# Patient Record
Sex: Female | Born: 1935 | Race: White | Hispanic: No | Smoking: Former smoker
Health system: Southern US, Community
[De-identification: ages and names within clinical notes are randomized; demographics above are authoritative.]

## PROBLEM LIST (undated history)

## (undated) DIAGNOSIS — G4700 Insomnia, unspecified: Secondary | ICD-10-CM

## (undated) DIAGNOSIS — K222 Esophageal obstruction: Secondary | ICD-10-CM

## (undated) DIAGNOSIS — I714 Abdominal aortic aneurysm, without rupture, unspecified: Secondary | ICD-10-CM

## (undated) DIAGNOSIS — K2970 Gastritis, unspecified, without bleeding: Secondary | ICD-10-CM

## (undated) DIAGNOSIS — D369 Benign neoplasm, unspecified site: Secondary | ICD-10-CM

## (undated) DIAGNOSIS — K648 Other hemorrhoids: Secondary | ICD-10-CM

## (undated) DIAGNOSIS — IMO0002 Reserved for concepts with insufficient information to code with codable children: Secondary | ICD-10-CM

## (undated) DIAGNOSIS — D126 Benign neoplasm of colon, unspecified: Secondary | ICD-10-CM

## (undated) DIAGNOSIS — C50919 Malignant neoplasm of unspecified site of unspecified female breast: Secondary | ICD-10-CM

## (undated) DIAGNOSIS — K228 Other specified diseases of esophagus: Secondary | ICD-10-CM

## (undated) DIAGNOSIS — E785 Hyperlipidemia, unspecified: Secondary | ICD-10-CM

## (undated) DIAGNOSIS — M1990 Unspecified osteoarthritis, unspecified site: Secondary | ICD-10-CM

## (undated) DIAGNOSIS — F32A Depression, unspecified: Secondary | ICD-10-CM

## (undated) DIAGNOSIS — F419 Anxiety disorder, unspecified: Secondary | ICD-10-CM

## (undated) DIAGNOSIS — S72009A Fracture of unspecified part of neck of unspecified femur, initial encounter for closed fracture: Secondary | ICD-10-CM

## (undated) DIAGNOSIS — F329 Major depressive disorder, single episode, unspecified: Secondary | ICD-10-CM

## (undated) DIAGNOSIS — J449 Chronic obstructive pulmonary disease, unspecified: Secondary | ICD-10-CM

## (undated) DIAGNOSIS — K5790 Diverticulosis of intestine, part unspecified, without perforation or abscess without bleeding: Secondary | ICD-10-CM

## (undated) DIAGNOSIS — C210 Malignant neoplasm of anus, unspecified: Secondary | ICD-10-CM

## (undated) DIAGNOSIS — M549 Dorsalgia, unspecified: Secondary | ICD-10-CM

## (undated) DIAGNOSIS — I519 Heart disease, unspecified: Secondary | ICD-10-CM

## (undated) DIAGNOSIS — I10 Essential (primary) hypertension: Secondary | ICD-10-CM

## (undated) DIAGNOSIS — I509 Heart failure, unspecified: Secondary | ICD-10-CM

## (undated) DIAGNOSIS — K2289 Other specified disease of esophagus: Secondary | ICD-10-CM

## (undated) DIAGNOSIS — R7302 Impaired glucose tolerance (oral): Secondary | ICD-10-CM

## (undated) HISTORY — PX: OOPHORECTOMY: SHX86

## (undated) HISTORY — DX: Esophageal obstruction: K22.2

## (undated) HISTORY — DX: Benign neoplasm of colon, unspecified: D12.6

## (undated) HISTORY — DX: Gastritis, unspecified, without bleeding: K29.70

## (undated) HISTORY — DX: Abdominal aortic aneurysm, without rupture, unspecified: I71.40

## (undated) HISTORY — DX: Heart disease, unspecified: I51.9

## (undated) HISTORY — DX: Depression, unspecified: F32.A

## (undated) HISTORY — DX: Benign neoplasm, unspecified site: D36.9

## (undated) HISTORY — DX: Malignant neoplasm of anus, unspecified: C21.0

## (undated) HISTORY — DX: Reserved for concepts with insufficient information to code with codable children: IMO0002

## (undated) HISTORY — DX: Unspecified osteoarthritis, unspecified site: M19.90

## (undated) HISTORY — PX: ROTATOR CUFF REPAIR: SHX139

## (undated) HISTORY — DX: Other specified disease of esophagus: K22.89

## (undated) HISTORY — PX: KNEE ARTHROSCOPY: SUR90

## (undated) HISTORY — DX: Heart failure, unspecified: I50.9

## (undated) HISTORY — DX: Diverticulosis of intestine, part unspecified, without perforation or abscess without bleeding: K57.90

## (undated) HISTORY — DX: Fracture of unspecified part of neck of unspecified femur, initial encounter for closed fracture: S72.009A

## (undated) HISTORY — DX: Impaired glucose tolerance (oral): R73.02

## (undated) HISTORY — DX: Other specified diseases of esophagus: K22.8

## (undated) HISTORY — DX: Dorsalgia, unspecified: M54.9

## (undated) HISTORY — DX: Chronic obstructive pulmonary disease, unspecified: J44.9

## (undated) HISTORY — DX: Insomnia, unspecified: G47.00

## (undated) HISTORY — DX: Malignant neoplasm of unspecified site of unspecified female breast: C50.919

## (undated) HISTORY — DX: Anxiety disorder, unspecified: F41.9

## (undated) HISTORY — DX: Essential (primary) hypertension: I10

## (undated) HISTORY — DX: Other hemorrhoids: K64.8

## (undated) HISTORY — DX: Hyperlipidemia, unspecified: E78.5

## (undated) HISTORY — DX: Major depressive disorder, single episode, unspecified: F32.9

## (undated) HISTORY — DX: Abdominal aortic aneurysm, without rupture: I71.4

## (undated) HISTORY — PX: KYPHOSIS SURGERY: SHX114

## (undated) HISTORY — PX: OTHER SURGICAL HISTORY: SHX169

---

## 1963-02-13 HISTORY — PX: HEMORRHOID SURGERY: SHX153

## 1966-02-12 HISTORY — PX: ABDOMINAL HYSTERECTOMY: SHX81

## 1966-02-12 HISTORY — PX: TONSILLECTOMY: SUR1361

## 1999-10-26 ENCOUNTER — Other Ambulatory Visit: Admission: RE | Admit: 1999-10-26 | Discharge: 1999-10-26 | Payer: Self-pay | Admitting: Internal Medicine

## 1999-10-26 ENCOUNTER — Encounter (INDEPENDENT_AMBULATORY_CARE_PROVIDER_SITE_OTHER): Payer: Self-pay | Admitting: Specialist

## 2000-03-28 ENCOUNTER — Encounter: Admission: RE | Admit: 2000-03-28 | Discharge: 2000-03-28 | Payer: Self-pay

## 2002-12-21 ENCOUNTER — Ambulatory Visit (HOSPITAL_COMMUNITY): Admission: RE | Admit: 2002-12-21 | Discharge: 2002-12-21 | Payer: Self-pay | Admitting: Internal Medicine

## 2003-09-09 ENCOUNTER — Ambulatory Visit (HOSPITAL_COMMUNITY): Admission: RE | Admit: 2003-09-09 | Discharge: 2003-09-09 | Payer: Self-pay | Admitting: Ophthalmology

## 2004-01-04 ENCOUNTER — Encounter: Admission: RE | Admit: 2004-01-04 | Discharge: 2004-02-23 | Payer: Self-pay | Admitting: Orthopedic Surgery

## 2004-01-26 ENCOUNTER — Ambulatory Visit: Payer: Self-pay | Admitting: Internal Medicine

## 2004-02-23 ENCOUNTER — Ambulatory Visit: Payer: Self-pay | Admitting: Internal Medicine

## 2004-04-21 ENCOUNTER — Ambulatory Visit: Payer: Self-pay | Admitting: Internal Medicine

## 2004-05-02 ENCOUNTER — Ambulatory Visit: Payer: Self-pay | Admitting: Internal Medicine

## 2004-05-30 ENCOUNTER — Ambulatory Visit: Payer: Self-pay | Admitting: Internal Medicine

## 2004-06-02 ENCOUNTER — Encounter: Admission: RE | Admit: 2004-06-02 | Discharge: 2004-06-02 | Payer: Self-pay | Admitting: Internal Medicine

## 2004-06-02 ENCOUNTER — Ambulatory Visit: Payer: Self-pay | Admitting: Internal Medicine

## 2004-06-08 ENCOUNTER — Encounter: Payer: Self-pay | Admitting: Internal Medicine

## 2004-06-09 ENCOUNTER — Ambulatory Visit: Payer: Self-pay | Admitting: Internal Medicine

## 2004-06-09 ENCOUNTER — Ambulatory Visit: Payer: Self-pay

## 2004-06-12 ENCOUNTER — Encounter (INDEPENDENT_AMBULATORY_CARE_PROVIDER_SITE_OTHER): Payer: Self-pay | Admitting: *Deleted

## 2004-06-12 ENCOUNTER — Ambulatory Visit (HOSPITAL_COMMUNITY): Admission: RE | Admit: 2004-06-12 | Discharge: 2004-06-12 | Payer: Self-pay | Admitting: Internal Medicine

## 2004-06-16 ENCOUNTER — Encounter (INDEPENDENT_AMBULATORY_CARE_PROVIDER_SITE_OTHER): Payer: Self-pay | Admitting: *Deleted

## 2004-06-16 ENCOUNTER — Ambulatory Visit (HOSPITAL_COMMUNITY): Admission: RE | Admit: 2004-06-16 | Discharge: 2004-06-16 | Payer: Self-pay | Admitting: Interventional Radiology

## 2004-06-19 ENCOUNTER — Ambulatory Visit: Payer: Self-pay | Admitting: Internal Medicine

## 2004-06-20 ENCOUNTER — Encounter: Admission: RE | Admit: 2004-06-20 | Discharge: 2004-06-20 | Payer: Self-pay | Admitting: Internal Medicine

## 2004-06-26 ENCOUNTER — Ambulatory Visit: Payer: Self-pay | Admitting: Internal Medicine

## 2004-06-26 ENCOUNTER — Encounter: Payer: Self-pay | Admitting: Interventional Radiology

## 2004-07-01 ENCOUNTER — Inpatient Hospital Stay (HOSPITAL_COMMUNITY): Admission: EM | Admit: 2004-07-01 | Discharge: 2004-07-04 | Payer: Self-pay | Admitting: Emergency Medicine

## 2004-07-02 ENCOUNTER — Ambulatory Visit: Payer: Self-pay | Admitting: Internal Medicine

## 2004-07-03 ENCOUNTER — Encounter: Payer: Self-pay | Admitting: Cardiology

## 2004-07-03 ENCOUNTER — Ambulatory Visit: Payer: Self-pay | Admitting: Cardiology

## 2004-07-11 ENCOUNTER — Ambulatory Visit: Payer: Self-pay | Admitting: Internal Medicine

## 2004-07-21 ENCOUNTER — Ambulatory Visit: Payer: Self-pay | Admitting: Internal Medicine

## 2004-07-25 ENCOUNTER — Ambulatory Visit: Payer: Self-pay | Admitting: Internal Medicine

## 2004-07-27 ENCOUNTER — Ambulatory Visit: Payer: Self-pay

## 2004-08-01 ENCOUNTER — Ambulatory Visit: Payer: Self-pay

## 2004-08-02 ENCOUNTER — Emergency Department (HOSPITAL_COMMUNITY): Admission: EM | Admit: 2004-08-02 | Discharge: 2004-08-02 | Payer: Self-pay | Admitting: Emergency Medicine

## 2004-08-03 ENCOUNTER — Ambulatory Visit: Payer: Self-pay | Admitting: Internal Medicine

## 2004-08-17 ENCOUNTER — Ambulatory Visit: Payer: Self-pay | Admitting: Internal Medicine

## 2004-09-25 ENCOUNTER — Encounter: Admission: RE | Admit: 2004-09-25 | Discharge: 2004-09-25 | Payer: Self-pay | Admitting: Internal Medicine

## 2004-09-25 ENCOUNTER — Ambulatory Visit: Payer: Self-pay | Admitting: Internal Medicine

## 2004-11-17 ENCOUNTER — Ambulatory Visit: Payer: Self-pay | Admitting: Internal Medicine

## 2005-02-06 ENCOUNTER — Ambulatory Visit: Payer: Self-pay | Admitting: Internal Medicine

## 2005-02-12 DIAGNOSIS — C50919 Malignant neoplasm of unspecified site of unspecified female breast: Secondary | ICD-10-CM

## 2005-02-12 HISTORY — DX: Malignant neoplasm of unspecified site of unspecified female breast: C50.919

## 2005-02-12 HISTORY — PX: MASTECTOMY, PARTIAL: SHX709

## 2005-04-20 ENCOUNTER — Ambulatory Visit: Payer: Self-pay | Admitting: Internal Medicine

## 2005-04-25 ENCOUNTER — Ambulatory Visit: Payer: Self-pay | Admitting: Internal Medicine

## 2005-04-26 ENCOUNTER — Encounter: Admission: RE | Admit: 2005-04-26 | Discharge: 2005-04-26 | Payer: Self-pay | Admitting: Internal Medicine

## 2005-05-11 ENCOUNTER — Ambulatory Visit (HOSPITAL_BASED_OUTPATIENT_CLINIC_OR_DEPARTMENT_OTHER): Admission: RE | Admit: 2005-05-11 | Discharge: 2005-05-11 | Payer: Self-pay | Admitting: Orthopedic Surgery

## 2005-07-20 ENCOUNTER — Ambulatory Visit: Payer: Self-pay | Admitting: Internal Medicine

## 2005-09-13 ENCOUNTER — Encounter: Admission: RE | Admit: 2005-09-13 | Discharge: 2005-09-13 | Payer: Self-pay | Admitting: Orthopedic Surgery

## 2005-10-11 ENCOUNTER — Ambulatory Visit: Payer: Self-pay | Admitting: Internal Medicine

## 2005-10-25 ENCOUNTER — Encounter (INDEPENDENT_AMBULATORY_CARE_PROVIDER_SITE_OTHER): Payer: Self-pay | Admitting: Diagnostic Radiology

## 2005-10-25 ENCOUNTER — Encounter (INDEPENDENT_AMBULATORY_CARE_PROVIDER_SITE_OTHER): Payer: Self-pay | Admitting: Specialist

## 2005-10-25 ENCOUNTER — Encounter: Admission: RE | Admit: 2005-10-25 | Discharge: 2005-10-25 | Payer: Self-pay | Admitting: Internal Medicine

## 2005-11-02 ENCOUNTER — Encounter: Admission: RE | Admit: 2005-11-02 | Discharge: 2005-11-02 | Payer: Self-pay | Admitting: Internal Medicine

## 2005-11-07 ENCOUNTER — Encounter: Admission: RE | Admit: 2005-11-07 | Discharge: 2005-11-07 | Payer: Self-pay | Admitting: General Surgery

## 2005-11-08 ENCOUNTER — Encounter (INDEPENDENT_AMBULATORY_CARE_PROVIDER_SITE_OTHER): Payer: Self-pay | Admitting: *Deleted

## 2005-11-08 ENCOUNTER — Ambulatory Visit (HOSPITAL_BASED_OUTPATIENT_CLINIC_OR_DEPARTMENT_OTHER): Admission: RE | Admit: 2005-11-08 | Discharge: 2005-11-08 | Payer: Self-pay | Admitting: General Surgery

## 2005-11-20 ENCOUNTER — Ambulatory Visit: Payer: Self-pay | Admitting: Oncology

## 2005-11-23 ENCOUNTER — Ambulatory Visit: Payer: Self-pay | Admitting: Internal Medicine

## 2005-12-11 ENCOUNTER — Ambulatory Visit: Payer: Self-pay | Admitting: Internal Medicine

## 2005-12-18 LAB — CBC & DIFF AND RETIC
BASO%: 0.7 % (ref 0.0–2.0)
EOS%: 2.2 % (ref 0.0–7.0)
HCT: 37.1 % (ref 34.8–46.6)
HGB: 12.4 g/dL (ref 11.6–15.9)
IRF: 0.27 (ref 0.130–0.330)
MCH: 33.1 pg (ref 26.0–34.0)
MCHC: 33.5 g/dL (ref 32.0–36.0)
MONO#: 0.7 10*3/uL (ref 0.1–0.9)
RDW: 13.7 % (ref 11.3–14.5)
RETIC #: 28.9 10*3/uL (ref 19.7–115.1)
WBC: 5.8 10*3/uL (ref 3.9–10.0)
lymph#: 1.7 10*3/uL (ref 0.9–3.3)

## 2005-12-18 LAB — MORPHOLOGY: PLT EST: ADEQUATE

## 2005-12-18 LAB — CHCC SMEAR

## 2005-12-19 LAB — PROTEIN ELECTROPHORESIS, SERUM
Beta 2: 3.9 % (ref 3.2–6.5)
Beta Globulin: 5.7 % (ref 4.7–7.2)
Gamma Globulin: 13.4 % (ref 11.1–18.8)

## 2005-12-19 LAB — IRON AND TIBC
Iron: 123 ug/dL (ref 42–145)
UIBC: 194 ug/dL

## 2005-12-19 LAB — FERRITIN: Ferritin: 41 ng/mL (ref 10–291)

## 2005-12-25 ENCOUNTER — Encounter: Payer: Self-pay | Admitting: Internal Medicine

## 2005-12-25 ENCOUNTER — Ambulatory Visit: Payer: Self-pay | Admitting: Internal Medicine

## 2005-12-25 ENCOUNTER — Ambulatory Visit: Admission: RE | Admit: 2005-12-25 | Discharge: 2006-03-01 | Payer: Self-pay | Admitting: Radiation Oncology

## 2006-01-09 ENCOUNTER — Ambulatory Visit: Payer: Self-pay | Admitting: Oncology

## 2006-01-14 ENCOUNTER — Ambulatory Visit: Payer: Self-pay | Admitting: Cardiology

## 2006-03-12 ENCOUNTER — Ambulatory Visit: Payer: Self-pay | Admitting: Oncology

## 2006-03-15 LAB — CBC WITH DIFFERENTIAL/PLATELET
BASO%: 1.1 % (ref 0.0–2.0)
EOS%: 1.8 % (ref 0.0–7.0)
HCT: 37.5 % (ref 34.8–46.6)
LYMPH%: 25 % (ref 14.0–48.0)
MCH: 33.3 pg (ref 26.0–34.0)
MCHC: 34.1 g/dL (ref 32.0–36.0)
MCV: 97.7 fL (ref 81.0–101.0)
MONO#: 0.5 10*3/uL (ref 0.1–0.9)
MONO%: 9.9 % (ref 0.0–13.0)
NEUT%: 62.2 % (ref 39.6–76.8)
Platelets: 294 10*3/uL (ref 145–400)
RBC: 3.84 10*6/uL (ref 3.70–5.32)
WBC: 5.4 10*3/uL (ref 3.9–10.0)

## 2006-03-15 LAB — COMPREHENSIVE METABOLIC PANEL
AST: 13 U/L (ref 0–37)
Albumin: 4.5 g/dL (ref 3.5–5.2)
Alkaline Phosphatase: 68 U/L (ref 39–117)
Calcium: 9.6 mg/dL (ref 8.4–10.5)
Creatinine, Ser: 0.78 mg/dL (ref 0.40–1.20)
Sodium: 143 mEq/L (ref 135–145)
Total Bilirubin: 0.5 mg/dL (ref 0.3–1.2)

## 2006-04-06 DIAGNOSIS — Z8719 Personal history of other diseases of the digestive system: Secondary | ICD-10-CM

## 2006-04-06 DIAGNOSIS — I509 Heart failure, unspecified: Secondary | ICD-10-CM | POA: Insufficient documentation

## 2006-04-06 DIAGNOSIS — M545 Low back pain: Secondary | ICD-10-CM

## 2006-04-06 DIAGNOSIS — G4700 Insomnia, unspecified: Secondary | ICD-10-CM

## 2006-04-06 DIAGNOSIS — K222 Esophageal obstruction: Secondary | ICD-10-CM | POA: Insufficient documentation

## 2006-04-06 DIAGNOSIS — M810 Age-related osteoporosis without current pathological fracture: Secondary | ICD-10-CM | POA: Insufficient documentation

## 2006-04-06 DIAGNOSIS — J449 Chronic obstructive pulmonary disease, unspecified: Secondary | ICD-10-CM

## 2006-04-06 DIAGNOSIS — E785 Hyperlipidemia, unspecified: Secondary | ICD-10-CM

## 2006-04-06 DIAGNOSIS — J4489 Other specified chronic obstructive pulmonary disease: Secondary | ICD-10-CM | POA: Insufficient documentation

## 2006-04-06 DIAGNOSIS — F329 Major depressive disorder, single episode, unspecified: Secondary | ICD-10-CM

## 2006-06-03 ENCOUNTER — Ambulatory Visit: Payer: Self-pay | Admitting: Oncology

## 2006-06-03 ENCOUNTER — Ambulatory Visit: Payer: Self-pay | Admitting: Internal Medicine

## 2006-06-05 ENCOUNTER — Encounter: Admission: RE | Admit: 2006-06-05 | Discharge: 2006-06-05 | Payer: Self-pay | Admitting: Internal Medicine

## 2006-06-24 LAB — COMPREHENSIVE METABOLIC PANEL
ALT: 14 U/L (ref 0–35)
AST: 18 U/L (ref 0–37)
Albumin: 4 g/dL (ref 3.5–5.2)
Alkaline Phosphatase: 61 U/L (ref 39–117)
BUN: 12 mg/dL (ref 6–23)
Calcium: 9.8 mg/dL (ref 8.4–10.5)
Chloride: 103 mEq/L (ref 96–112)
Potassium: 4 mEq/L (ref 3.5–5.3)
Sodium: 139 mEq/L (ref 135–145)
Total Protein: 7.1 g/dL (ref 6.0–8.3)

## 2006-06-24 LAB — CBC WITH DIFFERENTIAL/PLATELET
Eosinophils Absolute: 0.1 10*3/uL (ref 0.0–0.5)
MCV: 96.4 fL (ref 81.0–101.0)
MONO#: 0.6 10*3/uL (ref 0.1–0.9)
MONO%: 8.8 % (ref 0.0–13.0)
NEUT#: 3.8 10*3/uL (ref 1.5–6.5)
RBC: 3.79 10*6/uL (ref 3.70–5.32)
RDW: 11.1 % — ABNORMAL LOW (ref 11.3–14.5)
WBC: 6.4 10*3/uL (ref 3.9–10.0)

## 2006-07-04 ENCOUNTER — Ambulatory Visit: Payer: Self-pay | Admitting: Internal Medicine

## 2006-07-04 LAB — CONVERTED CEMR LAB
BUN: 16 mg/dL (ref 6–23)
Creatinine, Ser: 0.9 mg/dL (ref 0.4–1.2)

## 2006-07-09 ENCOUNTER — Ambulatory Visit: Payer: Self-pay | Admitting: Cardiology

## 2006-07-10 LAB — CANCER ANTIGEN 27.29: CA 27.29: 25 U/mL (ref 0–39)

## 2006-07-10 LAB — VITAMIN D PNL(25-HYDRXY+1,25-DIHY)-BLD
Vit D, 1,25-Dihydroxy: 19
Vit D, 25-Hydroxy: 10 ng/mL — ABNORMAL LOW (ref 20–57)

## 2006-07-12 ENCOUNTER — Telehealth: Payer: Self-pay | Admitting: Internal Medicine

## 2006-07-15 ENCOUNTER — Telehealth: Payer: Self-pay | Admitting: Internal Medicine

## 2006-07-15 DIAGNOSIS — I714 Abdominal aortic aneurysm, without rupture, unspecified: Secondary | ICD-10-CM | POA: Insufficient documentation

## 2006-07-17 ENCOUNTER — Encounter: Payer: Self-pay | Admitting: Internal Medicine

## 2006-08-05 ENCOUNTER — Ambulatory Visit: Payer: Self-pay | Admitting: Cardiology

## 2006-08-05 ENCOUNTER — Telehealth: Payer: Self-pay | Admitting: Internal Medicine

## 2006-08-12 ENCOUNTER — Ambulatory Visit: Payer: Self-pay | Admitting: Vascular Surgery

## 2006-08-12 ENCOUNTER — Encounter: Payer: Self-pay | Admitting: Internal Medicine

## 2006-08-30 ENCOUNTER — Encounter
Admission: RE | Admit: 2006-08-30 | Discharge: 2006-11-28 | Payer: Self-pay | Admitting: Physical Medicine & Rehabilitation

## 2006-08-30 ENCOUNTER — Ambulatory Visit: Payer: Self-pay | Admitting: Oncology

## 2006-09-02 ENCOUNTER — Ambulatory Visit: Payer: Self-pay | Admitting: Physical Medicine & Rehabilitation

## 2006-09-09 ENCOUNTER — Ambulatory Visit (HOSPITAL_COMMUNITY)
Admission: RE | Admit: 2006-09-09 | Discharge: 2006-09-09 | Payer: Self-pay | Admitting: Physical Medicine & Rehabilitation

## 2006-09-19 ENCOUNTER — Telehealth (INDEPENDENT_AMBULATORY_CARE_PROVIDER_SITE_OTHER): Payer: Self-pay | Admitting: *Deleted

## 2006-09-23 ENCOUNTER — Ambulatory Visit: Payer: Self-pay | Admitting: Family Medicine

## 2006-10-28 ENCOUNTER — Telehealth (INDEPENDENT_AMBULATORY_CARE_PROVIDER_SITE_OTHER): Payer: Self-pay | Admitting: *Deleted

## 2006-10-28 ENCOUNTER — Ambulatory Visit: Payer: Self-pay | Admitting: Physical Medicine & Rehabilitation

## 2006-10-29 ENCOUNTER — Ambulatory Visit: Payer: Self-pay | Admitting: Internal Medicine

## 2006-11-09 ENCOUNTER — Encounter: Payer: Self-pay | Admitting: Internal Medicine

## 2006-12-06 ENCOUNTER — Ambulatory Visit: Payer: Self-pay | Admitting: Internal Medicine

## 2006-12-16 ENCOUNTER — Ambulatory Visit: Payer: Self-pay | Admitting: Internal Medicine

## 2006-12-16 ENCOUNTER — Telehealth (INDEPENDENT_AMBULATORY_CARE_PROVIDER_SITE_OTHER): Payer: Self-pay | Admitting: *Deleted

## 2006-12-20 ENCOUNTER — Ambulatory Visit: Payer: Self-pay | Admitting: Internal Medicine

## 2006-12-23 ENCOUNTER — Ambulatory Visit: Payer: Self-pay | Admitting: Oncology

## 2007-01-06 LAB — COMPREHENSIVE METABOLIC PANEL
ALT: 9 U/L (ref 0–35)
AST: 13 U/L (ref 0–37)
Alkaline Phosphatase: 58 U/L (ref 39–117)
BUN: 9 mg/dL (ref 6–23)
Creatinine, Ser: 0.7 mg/dL (ref 0.40–1.20)
Potassium: 4.4 mEq/L (ref 3.5–5.3)

## 2007-01-06 LAB — CBC WITH DIFFERENTIAL/PLATELET
BASO%: 0.5 % (ref 0.0–2.0)
Basophils Absolute: 0 10*3/uL (ref 0.0–0.1)
EOS%: 1.3 % (ref 0.0–7.0)
HGB: 11.6 g/dL (ref 11.6–15.9)
MCH: 32.9 pg (ref 26.0–34.0)
MCHC: 34 g/dL (ref 32.0–36.0)
MCV: 96.9 fL (ref 81.0–101.0)
MONO%: 5.2 % (ref 0.0–13.0)
NEUT%: 76.2 % (ref 39.6–76.8)
RDW: 14 % (ref 11.3–14.5)
lymph#: 1.1 10*3/uL (ref 0.9–3.3)

## 2007-01-09 LAB — VITAMIN D PNL(25-HYDRXY+1,25-DIHY)-BLD: Vit D, 25-Hydroxy: 26 ng/mL — ABNORMAL LOW (ref 30–89)

## 2007-01-13 ENCOUNTER — Encounter: Payer: Self-pay | Admitting: Internal Medicine

## 2007-01-15 ENCOUNTER — Encounter: Payer: Self-pay | Admitting: Internal Medicine

## 2007-02-04 ENCOUNTER — Encounter: Payer: Self-pay | Admitting: Internal Medicine

## 2007-03-17 ENCOUNTER — Ambulatory Visit: Payer: Self-pay | Admitting: Internal Medicine

## 2007-03-21 ENCOUNTER — Telehealth (INDEPENDENT_AMBULATORY_CARE_PROVIDER_SITE_OTHER): Payer: Self-pay | Admitting: *Deleted

## 2007-03-21 LAB — CONVERTED CEMR LAB: Triglycerides: 77 mg/dL (ref 0–149)

## 2007-03-24 ENCOUNTER — Telehealth (INDEPENDENT_AMBULATORY_CARE_PROVIDER_SITE_OTHER): Payer: Self-pay | Admitting: *Deleted

## 2007-03-26 ENCOUNTER — Encounter: Payer: Self-pay | Admitting: Internal Medicine

## 2007-04-16 ENCOUNTER — Ambulatory Visit: Payer: Self-pay | Admitting: Internal Medicine

## 2007-04-21 ENCOUNTER — Ambulatory Visit: Payer: Self-pay | Admitting: Internal Medicine

## 2007-04-21 ENCOUNTER — Ambulatory Visit: Payer: Self-pay | Admitting: Critical Care Medicine

## 2007-04-21 ENCOUNTER — Ambulatory Visit: Payer: Self-pay | Admitting: Cardiovascular Disease

## 2007-04-21 ENCOUNTER — Inpatient Hospital Stay (HOSPITAL_COMMUNITY): Admission: EM | Admit: 2007-04-21 | Discharge: 2007-04-29 | Payer: Self-pay | Admitting: Emergency Medicine

## 2007-04-22 ENCOUNTER — Encounter (INDEPENDENT_AMBULATORY_CARE_PROVIDER_SITE_OTHER): Payer: Self-pay | Admitting: Internal Medicine

## 2007-04-22 LAB — CONVERTED CEMR LAB
Basophils Relative: 0.4 % (ref 0.0–1.0)
CO2: 30 meq/L (ref 19–32)
Calcium: 10.7 mg/dL — ABNORMAL HIGH (ref 8.4–10.5)
Eosinophils Absolute: 0.1 10*3/uL (ref 0.0–0.6)
Eosinophils Relative: 1.5 % (ref 0.0–5.0)
GFR calc Af Amer: 78 mL/min
GFR calc non Af Amer: 65 mL/min
Glucose, Bld: 96 mg/dL (ref 70–99)
Hemoglobin: 11.9 g/dL — ABNORMAL LOW (ref 12.0–15.0)
Lymphocytes Relative: 26.3 % (ref 12.0–46.0)
MCV: 98.6 fL (ref 78.0–100.0)
Monocytes Absolute: 0.6 10*3/uL (ref 0.2–0.7)
Neutro Abs: 4.5 10*3/uL (ref 1.4–7.7)
Neutrophils Relative %: 63.4 % (ref 43.0–77.0)
Platelets: 265 10*3/uL (ref 150–400)
Potassium: 3.8 meq/L (ref 3.5–5.1)
Sodium: 139 meq/L (ref 135–145)
WBC: 7 10*3/uL (ref 4.5–10.5)

## 2007-05-02 ENCOUNTER — Telehealth (INDEPENDENT_AMBULATORY_CARE_PROVIDER_SITE_OTHER): Payer: Self-pay | Admitting: *Deleted

## 2007-05-02 ENCOUNTER — Ambulatory Visit: Payer: Self-pay | Admitting: Internal Medicine

## 2007-05-02 DIAGNOSIS — D509 Iron deficiency anemia, unspecified: Secondary | ICD-10-CM | POA: Insufficient documentation

## 2007-05-05 ENCOUNTER — Telehealth (INDEPENDENT_AMBULATORY_CARE_PROVIDER_SITE_OTHER): Payer: Self-pay | Admitting: *Deleted

## 2007-06-03 ENCOUNTER — Ambulatory Visit: Payer: Self-pay | Admitting: Internal Medicine

## 2007-06-17 ENCOUNTER — Encounter: Payer: Self-pay | Admitting: Internal Medicine

## 2007-06-19 ENCOUNTER — Ambulatory Visit: Payer: Self-pay | Admitting: Internal Medicine

## 2007-06-26 ENCOUNTER — Ambulatory Visit: Payer: Self-pay | Admitting: Internal Medicine

## 2007-06-26 ENCOUNTER — Encounter: Payer: Self-pay | Admitting: Internal Medicine

## 2007-07-02 ENCOUNTER — Ambulatory Visit: Payer: Self-pay | Admitting: Oncology

## 2007-07-14 ENCOUNTER — Encounter: Payer: Self-pay | Admitting: Internal Medicine

## 2007-07-17 ENCOUNTER — Telehealth: Payer: Self-pay | Admitting: Internal Medicine

## 2007-07-17 ENCOUNTER — Telehealth (INDEPENDENT_AMBULATORY_CARE_PROVIDER_SITE_OTHER): Payer: Self-pay | Admitting: *Deleted

## 2007-07-31 ENCOUNTER — Ambulatory Visit: Payer: Self-pay | Admitting: Internal Medicine

## 2007-07-31 ENCOUNTER — Telehealth (INDEPENDENT_AMBULATORY_CARE_PROVIDER_SITE_OTHER): Payer: Self-pay | Admitting: *Deleted

## 2007-08-04 ENCOUNTER — Telehealth (INDEPENDENT_AMBULATORY_CARE_PROVIDER_SITE_OTHER): Payer: Self-pay | Admitting: *Deleted

## 2007-08-11 ENCOUNTER — Ambulatory Visit: Payer: Self-pay | Admitting: Internal Medicine

## 2007-08-18 ENCOUNTER — Ambulatory Visit: Payer: Self-pay | Admitting: Internal Medicine

## 2007-08-18 ENCOUNTER — Telehealth (INDEPENDENT_AMBULATORY_CARE_PROVIDER_SITE_OTHER): Payer: Self-pay | Admitting: *Deleted

## 2007-08-28 ENCOUNTER — Ambulatory Visit: Payer: Self-pay | Admitting: Cardiology

## 2007-11-10 ENCOUNTER — Telehealth (INDEPENDENT_AMBULATORY_CARE_PROVIDER_SITE_OTHER): Payer: Self-pay | Admitting: *Deleted

## 2007-11-11 ENCOUNTER — Ambulatory Visit: Payer: Self-pay | Admitting: Internal Medicine

## 2007-12-08 ENCOUNTER — Ambulatory Visit: Payer: Self-pay | Admitting: Internal Medicine

## 2007-12-09 ENCOUNTER — Ambulatory Visit: Payer: Self-pay | Admitting: Internal Medicine

## 2007-12-11 ENCOUNTER — Telehealth (INDEPENDENT_AMBULATORY_CARE_PROVIDER_SITE_OTHER): Payer: Self-pay | Admitting: *Deleted

## 2007-12-11 LAB — CONVERTED CEMR LAB
ALT: 13 units/L (ref 0–35)
AST: 15 units/L (ref 0–37)
Direct LDL: 127.1 mg/dL
TSH: 1.13 microintl units/mL (ref 0.35–5.50)
Total CHOL/HDL Ratio: 3.2
VLDL: 17 mg/dL (ref 0–40)

## 2007-12-26 ENCOUNTER — Ambulatory Visit: Payer: Self-pay | Admitting: Oncology

## 2007-12-30 ENCOUNTER — Encounter: Payer: Self-pay | Admitting: Internal Medicine

## 2007-12-30 LAB — CANCER ANTIGEN 27.29: CA 27.29: 23 U/mL (ref 0–39)

## 2007-12-30 LAB — CBC WITH DIFFERENTIAL/PLATELET
BASO%: 0.3 % (ref 0.0–2.0)
Eosinophils Absolute: 0.1 10*3/uL (ref 0.0–0.5)
HCT: 35.9 % (ref 34.8–46.6)
HGB: 12.1 g/dL (ref 11.6–15.9)
LYMPH%: 26.9 % (ref 14.0–48.0)
MCHC: 33.9 g/dL (ref 32.0–36.0)
MONO#: 0.4 10*3/uL (ref 0.1–0.9)
NEUT#: 3.5 10*3/uL (ref 1.5–6.5)
NEUT%: 63.9 % (ref 39.6–76.8)
Platelets: 230 10*3/uL (ref 145–400)
WBC: 5.5 10*3/uL (ref 3.9–10.0)
lymph#: 1.5 10*3/uL (ref 0.9–3.3)

## 2007-12-30 LAB — COMPREHENSIVE METABOLIC PANEL
ALT: 13 U/L (ref 0–35)
CO2: 31 mEq/L (ref 19–32)
Calcium: 9.5 mg/dL (ref 8.4–10.5)
Chloride: 102 mEq/L (ref 96–112)
Creatinine, Ser: 0.76 mg/dL (ref 0.40–1.20)
Glucose, Bld: 103 mg/dL — ABNORMAL HIGH (ref 70–99)
Total Bilirubin: 0.8 mg/dL (ref 0.3–1.2)
Total Protein: 6.3 g/dL (ref 6.0–8.3)

## 2008-01-09 ENCOUNTER — Telehealth (INDEPENDENT_AMBULATORY_CARE_PROVIDER_SITE_OTHER): Payer: Self-pay | Admitting: *Deleted

## 2008-01-27 ENCOUNTER — Ambulatory Visit: Payer: Self-pay | Admitting: Internal Medicine

## 2008-01-27 DIAGNOSIS — R5383 Other fatigue: Secondary | ICD-10-CM

## 2008-01-27 DIAGNOSIS — R5381 Other malaise: Secondary | ICD-10-CM

## 2008-01-27 LAB — CONVERTED CEMR LAB: Vit D, 1,25-Dihydroxy: 50 (ref 30–89)

## 2008-01-29 ENCOUNTER — Encounter: Payer: Self-pay | Admitting: Internal Medicine

## 2008-01-30 ENCOUNTER — Telehealth: Payer: Self-pay | Admitting: Internal Medicine

## 2008-01-30 LAB — CONVERTED CEMR LAB
BUN: 7 mg/dL (ref 6–23)
Basophils Absolute: 0 10*3/uL (ref 0.0–0.1)
Chloride: 105 meq/L (ref 96–112)
Creatinine, Ser: 0.8 mg/dL (ref 0.4–1.2)
Folate: 10.4 ng/mL
GFR calc Af Amer: 89 mL/min
GFR calc non Af Amer: 74 mL/min
Hemoglobin: 12.2 g/dL (ref 12.0–15.0)
Lymphocytes Relative: 31.6 % (ref 12.0–46.0)
MCHC: 34.7 g/dL (ref 30.0–36.0)
Monocytes Relative: 6.8 % (ref 3.0–12.0)
Neutro Abs: 3.6 10*3/uL (ref 1.4–7.7)
Phosphorus: 3.8 mg/dL (ref 2.3–4.6)
Platelets: 207 10*3/uL (ref 150–400)
Potassium: 4.2 meq/L (ref 3.5–5.1)
RDW: 12.8 % (ref 11.5–14.6)
Vitamin B-12: 197 pg/mL — ABNORMAL LOW (ref 211–911)

## 2008-02-12 ENCOUNTER — Encounter: Payer: Self-pay | Admitting: Internal Medicine

## 2008-02-12 ENCOUNTER — Telehealth (INDEPENDENT_AMBULATORY_CARE_PROVIDER_SITE_OTHER): Payer: Self-pay | Admitting: *Deleted

## 2008-02-17 ENCOUNTER — Telehealth (INDEPENDENT_AMBULATORY_CARE_PROVIDER_SITE_OTHER): Payer: Self-pay | Admitting: *Deleted

## 2008-02-18 ENCOUNTER — Encounter: Payer: Self-pay | Admitting: Internal Medicine

## 2008-02-20 ENCOUNTER — Ambulatory Visit (HOSPITAL_COMMUNITY): Admission: RE | Admit: 2008-02-20 | Discharge: 2008-02-20 | Payer: Self-pay | Admitting: Internal Medicine

## 2008-02-20 ENCOUNTER — Encounter: Payer: Self-pay | Admitting: Internal Medicine

## 2008-02-27 ENCOUNTER — Telehealth (INDEPENDENT_AMBULATORY_CARE_PROVIDER_SITE_OTHER): Payer: Self-pay | Admitting: *Deleted

## 2008-03-15 ENCOUNTER — Telehealth (INDEPENDENT_AMBULATORY_CARE_PROVIDER_SITE_OTHER): Payer: Self-pay | Admitting: *Deleted

## 2008-04-06 ENCOUNTER — Telehealth (INDEPENDENT_AMBULATORY_CARE_PROVIDER_SITE_OTHER): Payer: Self-pay | Admitting: *Deleted

## 2008-04-26 ENCOUNTER — Ambulatory Visit: Payer: Self-pay | Admitting: Internal Medicine

## 2008-05-04 ENCOUNTER — Ambulatory Visit: Payer: Self-pay | Admitting: Internal Medicine

## 2008-05-05 ENCOUNTER — Encounter (INDEPENDENT_AMBULATORY_CARE_PROVIDER_SITE_OTHER): Payer: Self-pay | Admitting: *Deleted

## 2008-05-05 LAB — CONVERTED CEMR LAB
ALT: 15 units/L (ref 0–35)
AST: 18 units/L (ref 0–37)
Cholesterol: 157 mg/dL (ref 0–200)
Iron: 80 ug/dL (ref 42–145)
LDL Cholesterol: 59 mg/dL (ref 0–99)
VLDL: 14 mg/dL (ref 0.0–40.0)

## 2008-06-07 ENCOUNTER — Encounter (INDEPENDENT_AMBULATORY_CARE_PROVIDER_SITE_OTHER): Payer: Self-pay | Admitting: *Deleted

## 2008-06-16 ENCOUNTER — Encounter: Payer: Self-pay | Admitting: Internal Medicine

## 2008-07-08 ENCOUNTER — Ambulatory Visit: Payer: Self-pay | Admitting: Oncology

## 2008-07-09 ENCOUNTER — Ambulatory Visit: Payer: Self-pay | Admitting: Internal Medicine

## 2008-10-08 ENCOUNTER — Ambulatory Visit: Payer: Self-pay | Admitting: Internal Medicine

## 2008-10-12 ENCOUNTER — Encounter (INDEPENDENT_AMBULATORY_CARE_PROVIDER_SITE_OTHER): Payer: Self-pay | Admitting: *Deleted

## 2008-10-19 LAB — CONVERTED CEMR LAB
BUN: 7 mg/dL (ref 6–23)
Basophils Absolute: 0 10*3/uL (ref 0.0–0.1)
Basophils Relative: 0.8 % (ref 0.0–3.0)
CO2: 34 meq/L — ABNORMAL HIGH (ref 19–32)
Calcium: 9.3 mg/dL (ref 8.4–10.5)
Chloride: 103 meq/L (ref 96–112)
Creatinine, Ser: 0.8 mg/dL (ref 0.4–1.2)
Eosinophils Absolute: 0.1 10*3/uL (ref 0.0–0.7)
Glucose, Bld: 84 mg/dL (ref 70–99)
Lymphocytes Relative: 29.6 % (ref 12.0–46.0)
MCHC: 33.3 g/dL (ref 30.0–36.0)
MCV: 98.5 fL (ref 78.0–100.0)
Monocytes Absolute: 0.6 10*3/uL (ref 0.1–1.0)
Neutrophils Relative %: 56.3 % (ref 43.0–77.0)
Platelets: 219 10*3/uL (ref 150.0–400.0)
RDW: 13 % (ref 11.5–14.6)

## 2008-10-20 ENCOUNTER — Encounter: Payer: Self-pay | Admitting: Internal Medicine

## 2008-10-28 DIAGNOSIS — I872 Venous insufficiency (chronic) (peripheral): Secondary | ICD-10-CM | POA: Insufficient documentation

## 2008-10-28 DIAGNOSIS — Z9889 Other specified postprocedural states: Secondary | ICD-10-CM | POA: Insufficient documentation

## 2008-10-29 ENCOUNTER — Ambulatory Visit: Payer: Self-pay | Admitting: Cardiology

## 2008-10-29 DIAGNOSIS — I10 Essential (primary) hypertension: Secondary | ICD-10-CM | POA: Insufficient documentation

## 2008-11-03 ENCOUNTER — Telehealth (INDEPENDENT_AMBULATORY_CARE_PROVIDER_SITE_OTHER): Payer: Self-pay

## 2008-11-04 ENCOUNTER — Ambulatory Visit: Payer: Self-pay

## 2008-11-04 ENCOUNTER — Encounter: Payer: Self-pay | Admitting: Cardiology

## 2008-11-10 ENCOUNTER — Telehealth: Payer: Self-pay | Admitting: Internal Medicine

## 2008-11-11 ENCOUNTER — Telehealth: Payer: Self-pay | Admitting: Internal Medicine

## 2008-11-12 ENCOUNTER — Ambulatory Visit: Payer: Self-pay | Admitting: Internal Medicine

## 2008-11-12 ENCOUNTER — Encounter (INDEPENDENT_AMBULATORY_CARE_PROVIDER_SITE_OTHER): Payer: Self-pay | Admitting: *Deleted

## 2008-11-19 ENCOUNTER — Ambulatory Visit: Payer: Self-pay | Admitting: Internal Medicine

## 2008-11-22 ENCOUNTER — Encounter: Payer: Self-pay | Admitting: Internal Medicine

## 2008-12-09 ENCOUNTER — Telehealth (INDEPENDENT_AMBULATORY_CARE_PROVIDER_SITE_OTHER): Payer: Self-pay | Admitting: *Deleted

## 2008-12-13 ENCOUNTER — Encounter (INDEPENDENT_AMBULATORY_CARE_PROVIDER_SITE_OTHER): Payer: Self-pay | Admitting: General Surgery

## 2008-12-13 ENCOUNTER — Ambulatory Visit (HOSPITAL_BASED_OUTPATIENT_CLINIC_OR_DEPARTMENT_OTHER): Admission: RE | Admit: 2008-12-13 | Discharge: 2008-12-14 | Payer: Self-pay | Admitting: General Surgery

## 2008-12-13 DIAGNOSIS — C210 Malignant neoplasm of anus, unspecified: Secondary | ICD-10-CM

## 2008-12-13 HISTORY — DX: Malignant neoplasm of anus, unspecified: C21.0

## 2008-12-29 ENCOUNTER — Encounter: Payer: Self-pay | Admitting: Cardiology

## 2008-12-29 ENCOUNTER — Encounter: Payer: Self-pay | Admitting: Internal Medicine

## 2009-01-07 ENCOUNTER — Ambulatory Visit: Payer: Self-pay | Admitting: Oncology

## 2009-01-11 ENCOUNTER — Encounter: Payer: Self-pay | Admitting: Internal Medicine

## 2009-01-12 ENCOUNTER — Encounter: Payer: Self-pay | Admitting: Internal Medicine

## 2009-01-12 ENCOUNTER — Encounter: Payer: Self-pay | Admitting: Cardiology

## 2009-01-14 ENCOUNTER — Ambulatory Visit: Admission: RE | Admit: 2009-01-14 | Discharge: 2009-02-09 | Payer: Self-pay | Admitting: Radiation Oncology

## 2009-01-17 ENCOUNTER — Encounter: Payer: Self-pay | Admitting: Internal Medicine

## 2009-01-20 ENCOUNTER — Encounter: Payer: Self-pay | Admitting: Internal Medicine

## 2009-01-20 LAB — COMPREHENSIVE METABOLIC PANEL
ALT: 11 U/L (ref 0–35)
AST: 15 U/L (ref 0–37)
Albumin: 4.5 g/dL (ref 3.5–5.2)
Alkaline Phosphatase: 52 U/L (ref 39–117)
Calcium: 10.5 mg/dL (ref 8.4–10.5)
Chloride: 101 mEq/L (ref 96–112)
Potassium: 4.3 mEq/L (ref 3.5–5.3)
Sodium: 140 mEq/L (ref 135–145)

## 2009-01-20 LAB — CBC WITH DIFFERENTIAL/PLATELET
BASO%: 0.8 % (ref 0.0–2.0)
EOS%: 0.9 % (ref 0.0–7.0)
HGB: 11.8 g/dL (ref 11.6–15.9)
MCH: 32.9 pg (ref 25.1–34.0)
MCHC: 33.3 g/dL (ref 31.5–36.0)
MCV: 98.8 fL (ref 79.5–101.0)
MONO%: 10.4 % (ref 0.0–14.0)
RBC: 3.59 10*6/uL — ABNORMAL LOW (ref 3.70–5.45)
RDW: 13.6 % (ref 11.2–14.5)
lymph#: 1.6 10*3/uL (ref 0.9–3.3)

## 2009-01-26 ENCOUNTER — Encounter: Payer: Self-pay | Admitting: Cardiology

## 2009-01-26 ENCOUNTER — Encounter: Payer: Self-pay | Admitting: Internal Medicine

## 2009-02-12 HISTORY — PX: HIP FRACTURE SURGERY: SHX118

## 2009-02-18 ENCOUNTER — Ambulatory Visit: Payer: Self-pay | Admitting: Internal Medicine

## 2009-02-18 DIAGNOSIS — C210 Malignant neoplasm of anus, unspecified: Secondary | ICD-10-CM | POA: Insufficient documentation

## 2009-02-21 LAB — CONVERTED CEMR LAB
Basophils Absolute: 0.1 10*3/uL (ref 0.0–0.1)
Folate: 6.9 ng/mL
HCT: 36.1 % (ref 36.0–46.0)
Iron: 95 ug/dL (ref 42–145)
Lymphs Abs: 1.8 10*3/uL (ref 0.7–4.0)
Monocytes Relative: 10.3 % (ref 3.0–12.0)
Neutrophils Relative %: 55.1 % (ref 43.0–77.0)
Platelets: 218 10*3/uL (ref 150.0–400.0)
RDW: 12.6 % (ref 11.5–14.6)
Transferrin: 252.2 mg/dL (ref 212.0–360.0)
Vitamin B-12: 225 pg/mL (ref 211–911)

## 2009-03-16 ENCOUNTER — Ambulatory Visit (HOSPITAL_COMMUNITY): Admission: RE | Admit: 2009-03-16 | Discharge: 2009-03-17 | Payer: Self-pay | Admitting: General Surgery

## 2009-03-16 ENCOUNTER — Encounter (INDEPENDENT_AMBULATORY_CARE_PROVIDER_SITE_OTHER): Payer: Self-pay | Admitting: General Surgery

## 2009-04-06 ENCOUNTER — Encounter: Payer: Self-pay | Admitting: Internal Medicine

## 2009-04-12 ENCOUNTER — Telehealth: Payer: Self-pay | Admitting: Internal Medicine

## 2009-04-19 ENCOUNTER — Ambulatory Visit: Payer: Self-pay | Admitting: Oncology

## 2009-04-21 ENCOUNTER — Encounter: Payer: Self-pay | Admitting: Internal Medicine

## 2009-06-17 ENCOUNTER — Ambulatory Visit: Payer: Self-pay | Admitting: Internal Medicine

## 2009-06-21 LAB — CONVERTED CEMR LAB
BUN: 15 mg/dL (ref 6–23)
Calcium: 9.7 mg/dL (ref 8.4–10.5)
Creatinine, Ser: 0.7 mg/dL (ref 0.4–1.2)
Glucose, Bld: 48 mg/dL — CL (ref 70–99)
Sodium: 141 meq/L (ref 135–145)
Transferrin: 214.8 mg/dL (ref 212.0–360.0)

## 2009-06-22 ENCOUNTER — Ambulatory Visit: Payer: Self-pay | Admitting: Internal Medicine

## 2009-07-27 ENCOUNTER — Telehealth: Payer: Self-pay | Admitting: Internal Medicine

## 2009-07-29 ENCOUNTER — Encounter: Payer: Self-pay | Admitting: Internal Medicine

## 2009-07-30 ENCOUNTER — Encounter: Payer: Self-pay | Admitting: Internal Medicine

## 2009-08-05 ENCOUNTER — Telehealth: Payer: Self-pay | Admitting: Internal Medicine

## 2009-08-09 ENCOUNTER — Telehealth (INDEPENDENT_AMBULATORY_CARE_PROVIDER_SITE_OTHER): Payer: Self-pay | Admitting: *Deleted

## 2009-08-10 ENCOUNTER — Telehealth: Payer: Self-pay | Admitting: Internal Medicine

## 2009-08-12 ENCOUNTER — Encounter: Payer: Self-pay | Admitting: Internal Medicine

## 2009-08-12 DIAGNOSIS — S72009A Fracture of unspecified part of neck of unspecified femur, initial encounter for closed fracture: Secondary | ICD-10-CM

## 2009-08-12 HISTORY — DX: Fracture of unspecified part of neck of unspecified femur, initial encounter for closed fracture: S72.009A

## 2009-08-16 ENCOUNTER — Telehealth: Payer: Self-pay | Admitting: Internal Medicine

## 2009-08-24 ENCOUNTER — Ambulatory Visit: Payer: Self-pay | Admitting: Cardiology

## 2009-08-24 ENCOUNTER — Encounter (INDEPENDENT_AMBULATORY_CARE_PROVIDER_SITE_OTHER): Payer: Self-pay | Admitting: Internal Medicine

## 2009-08-24 ENCOUNTER — Encounter: Payer: Self-pay | Admitting: Emergency Medicine

## 2009-08-24 ENCOUNTER — Inpatient Hospital Stay (HOSPITAL_COMMUNITY): Admission: EM | Admit: 2009-08-24 | Discharge: 2009-08-29 | Payer: Self-pay | Admitting: Internal Medicine

## 2009-08-25 ENCOUNTER — Encounter (INDEPENDENT_AMBULATORY_CARE_PROVIDER_SITE_OTHER): Payer: Self-pay | Admitting: Internal Medicine

## 2009-08-30 ENCOUNTER — Encounter: Payer: Self-pay | Admitting: Internal Medicine

## 2009-09-05 ENCOUNTER — Telehealth (INDEPENDENT_AMBULATORY_CARE_PROVIDER_SITE_OTHER): Payer: Self-pay | Admitting: *Deleted

## 2009-09-19 ENCOUNTER — Ambulatory Visit: Payer: Self-pay | Admitting: Internal Medicine

## 2009-09-20 ENCOUNTER — Encounter: Payer: Self-pay | Admitting: Internal Medicine

## 2009-09-23 LAB — CONVERTED CEMR LAB
Basophils Absolute: 0.1 10*3/uL (ref 0.0–0.1)
CO2: 34 meq/L — ABNORMAL HIGH (ref 19–32)
Calcium: 9.8 mg/dL (ref 8.4–10.5)
Chloride: 100 meq/L (ref 96–112)
Folate: 6.2 ng/mL
Lymphocytes Relative: 23.1 % (ref 12.0–46.0)
Monocytes Relative: 9.5 % (ref 3.0–12.0)
Platelets: 286 10*3/uL (ref 150.0–400.0)
RDW: 15.3 % — ABNORMAL HIGH (ref 11.5–14.6)
Saturation Ratios: 35.7 % (ref 20.0–50.0)
Sodium: 142 meq/L (ref 135–145)
Transferrin: 242 mg/dL (ref 212.0–360.0)
Vitamin B-12: 288 pg/mL (ref 211–911)

## 2009-09-26 ENCOUNTER — Encounter: Payer: Self-pay | Admitting: Internal Medicine

## 2009-10-05 ENCOUNTER — Ambulatory Visit (HOSPITAL_COMMUNITY): Admission: RE | Admit: 2009-10-05 | Discharge: 2009-10-05 | Payer: Self-pay | Admitting: Internal Medicine

## 2009-10-05 ENCOUNTER — Telehealth: Payer: Self-pay | Admitting: Internal Medicine

## 2009-10-05 ENCOUNTER — Encounter: Payer: Self-pay | Admitting: Internal Medicine

## 2009-10-19 ENCOUNTER — Encounter: Payer: Self-pay | Admitting: Internal Medicine

## 2009-10-20 ENCOUNTER — Telehealth: Payer: Self-pay | Admitting: Internal Medicine

## 2009-10-24 ENCOUNTER — Telehealth: Payer: Self-pay | Admitting: Internal Medicine

## 2009-11-08 ENCOUNTER — Ambulatory Visit: Payer: Self-pay | Admitting: Internal Medicine

## 2009-11-14 ENCOUNTER — Telehealth: Payer: Self-pay | Admitting: Internal Medicine

## 2009-11-21 ENCOUNTER — Ambulatory Visit: Payer: Self-pay | Admitting: Internal Medicine

## 2009-11-21 DIAGNOSIS — R634 Abnormal weight loss: Secondary | ICD-10-CM

## 2009-11-22 ENCOUNTER — Telehealth: Payer: Self-pay | Admitting: Internal Medicine

## 2009-11-23 LAB — CONVERTED CEMR LAB
AST: 22 units/L (ref 0–37)
Cholesterol: 205 mg/dL — ABNORMAL HIGH (ref 0–200)
HDL: 94.2 mg/dL (ref >39.00)
VLDL: 23.2 mg/dL (ref 0.0–40.0)

## 2009-12-07 ENCOUNTER — Telehealth: Payer: Self-pay | Admitting: Internal Medicine

## 2009-12-19 ENCOUNTER — Ambulatory Visit: Payer: Self-pay | Admitting: Internal Medicine

## 2009-12-20 ENCOUNTER — Telehealth: Payer: Self-pay | Admitting: Internal Medicine

## 2009-12-26 ENCOUNTER — Telehealth (INDEPENDENT_AMBULATORY_CARE_PROVIDER_SITE_OTHER): Payer: Self-pay | Admitting: *Deleted

## 2010-01-17 ENCOUNTER — Ambulatory Visit: Payer: Self-pay | Admitting: Oncology

## 2010-01-20 ENCOUNTER — Telehealth: Payer: Self-pay | Admitting: Internal Medicine

## 2010-02-01 ENCOUNTER — Encounter: Payer: Self-pay | Admitting: Internal Medicine

## 2010-02-08 ENCOUNTER — Telehealth (INDEPENDENT_AMBULATORY_CARE_PROVIDER_SITE_OTHER): Payer: Self-pay | Admitting: *Deleted

## 2010-02-16 ENCOUNTER — Telehealth: Payer: Self-pay | Admitting: Internal Medicine

## 2010-02-22 ENCOUNTER — Ambulatory Visit
Admission: RE | Admit: 2010-02-22 | Discharge: 2010-02-22 | Payer: Self-pay | Source: Home / Self Care | Attending: Internal Medicine | Admitting: Internal Medicine

## 2010-02-22 ENCOUNTER — Ambulatory Visit: Payer: Self-pay | Admitting: Oncology

## 2010-03-03 ENCOUNTER — Telehealth (INDEPENDENT_AMBULATORY_CARE_PROVIDER_SITE_OTHER): Payer: Self-pay | Admitting: *Deleted

## 2010-03-14 NOTE — Procedures (Signed)
Summary: Oximetry/[facility]  Oximetry/[facility]   Imported By: Lanelle Bal 08/23/2009 12:07:13  _____________________________________________________________________  External Attachment:    Type:   Image     Comment:   External Document

## 2010-03-14 NOTE — Progress Notes (Signed)
Summary: refill  Phone Note Refill Request Message from:  Fax from Pharmacy on October 20, 2009 10:12 AM  Refills Requested: Medication #1:  CLONAZEPAM 0.5 MG TABS 3 by mouth at bedtime fax [phone] [facility] - fax [phone]  Initial call taken by: Okey Regal Spring,  October 20, 2009 10:13 AM  Follow-up for Phone Call        07/18/09 #90 x 2. Army Fossa CMA  October 20, 2009 10:44 AM   Additional Follow-up for Phone Call Additional follow up Details #1::        ok  #90,2 refills Additional Follow-up by: [facility] E. Gilberta Peeters MD,  October 20, 2009 1:00 PM    Prescriptions: CLONAZEPAM 0.5 MG TABS (CLONAZEPAM) 3 by mouth at bedtime fax [phone]  #90 x 2   Entered by:   Army Fossa CMA   Authorized by:   Nolon Rod. Jacqueleen Pulver MD   Signed by:   Army Fossa CMA on 10/20/2009   Method used:   Printed then faxed to ...       [facility]* (retail)       [address]       [address]       [place], [place]  [address]       Ph: [phone]       Fax: [phone]   RxID:   [id number]

## 2010-03-14 NOTE — Progress Notes (Signed)
Summary: pneumo shot side effect  Phone Note Call from Patient Call back at [facility] Phone [phone]   Summary of Call: Patient called stating that her arm is sore to the touch and she has a low grade fever (99) from her pneumo shot yesterday. Patient would like to know if this is normal and what do you recommend? Please advise. Initial call taken by: Lucious Groves CMA,  November 22, 2009 2:39 PM  Follow-up for Phone Call        put ice in the arm, Tylenol if needed.  Call back if symptoms continue more than 3 days, she has high fever, swelling and redness in the arm Follow-up by: Deshon Hsiao E. Analyse Angst MD,  November 23, 2009 8:01 AM  Additional Follow-up for Phone Call Additional follow up Details #1::        Patient notified.  Additional Follow-up by: Lucious Groves CMA,  November 23, 2009 8:20 AM

## 2010-03-14 NOTE — Progress Notes (Signed)
Summary: refil  Phone Note Refill Request Message from:  Fax from Pharmacy on December 26, 2009 1:56 PM  Refills Requested: Medication #1:  SERTRALINE HCL 50 MG TAB TAKE AS DIRECTED [facility] - fax [phone]  Initial call taken by: Okey Regal Spring,  December 26, 2009 2:02 PM  Follow-up for Phone Call        left message for pt. Pt is on Citalopram- Sertraline was d/c in hospital due to making pt nervouse.  Follow-up by: Army Fossa CMA,  December 26, 2009 2:15 PM  Additional Follow-up for Phone Call Additional follow up Details #1::        Pt called back and states she is no longer taking Zoloft.  Additional Follow-up by: Army Fossa CMA,  December 26, 2009 2:23 PM

## 2010-03-14 NOTE — Progress Notes (Signed)
Summary: pravachol refill   Phone Note Refill Request Message from:  Fax from Pharmacy on September 05, 2009 11:12 AM  Refills Requested: Medication #1:  PRAVACHOL 40 MG TABS one by mouth daily [facility] - fax  [phone]  Initial call taken by: Okey Regal Spring,  September 05, 2009 11:15 AM    Prescriptions: PRAVACHOL 40 MG TABS (PRAVASTATIN SODIUM) one by mouth daily  #90 x 1   Entered by:   Doristine Devoid CMA   Authorized by:   Nolon Rod. Paz MD   Signed by:   Doristine Devoid CMA on 09/05/2009   Method used:   Electronically to        [facility]* (retail)       [address]       [address]       [place], [place]  [address]       Ph: [phone]       Fax: [phone]   RxID:   [id number]

## 2010-03-14 NOTE — Progress Notes (Signed)
Summary: O2 Rx  Phone Note Other Incoming   Caller: Brett Canales from [facility] Summary of Call: Needs a written rx for continous O2, 2L per min faxed to [phone]. Initial call taken by: Harold Barban,  August 09, 2009 2:04 PM    New/Updated Medications: * 24 HOURS PULSE OXIMETER ON ROOM AIR 2 L per min DX COPD-496 Prescriptions: 24 HOURS PULSE OXIMETER ON ROOM AIR 2 L per min DX COPD-496  #1 x 0   Entered by:   Army Fossa CMA   Authorized by:   Nolon Rod. Paz MD   Signed by:   Army Fossa CMA on 08/09/2009   Method used:   Printed then faxed to ...       [facility]  [address] [id number]* (retail)       [address]       [place], [place]  [address]       Ph: [phone] or [phone]       Fax: [phone]   RxID:   [id number]   Appended Document: O2 Rx Medications Added * CONTINOUS OXYGEN 2 L per min.          Clinical Lists Changes  Medications: Removed medication of * 24 HOURS PULSE OXIMETER ON ROOM AIR 2 L per min DX COPD-496 Added new medication of * CONTINOUS OXYGEN 2 L per min. - Signed Rx of CONTINOUS OXYGEN 2 L per min.;  #1 x 0;  Signed;  Entered by: Army Fossa CMA;  Authorized by: Nolon Rod Paz MD;  Method used: Print then Give to Patient    Prescriptions: CONTINOUS OXYGEN 2 L per min.  #1 x 0   Entered by:   Army Fossa CMA   Authorized by:   Nolon Rod. Paz MD   Signed by:   Army Fossa CMA on 08/09/2009   Method used:   Print then Give to Patient   RxID:   [id number]

## 2010-03-14 NOTE — Progress Notes (Signed)
Summary: benazepril RF ?  Phone Note Outgoing Call   Summary of Call:   requesting a refill on benazepril , this was on hold you to low BPs. is she  still taking it? How are her BPs? Jose E. Paz MD  November 14, 2009 11:05 AM     Follow-up for Phone Call        Pt states that she has been taking the Benazpirl and her BP has been running high and low. Unable to give me exact numbers. Army Fossa CMA  November 14, 2009 11:26 AM   Additional Follow-up for Phone Call Additional follow up Details #1::        -okay to refill -advised patient to check BP twice daily -follow up within one week with BP readings Jose E. Paz MD  November 14, 2009 1:13 PM     Additional Follow-up for Phone Call Additional follow up Details #2::    Pt is aware, has an appt next monday. Army Fossa CMA  November 14, 2009 1:58 PM   New/Updated Medications: BENAZEPRIL HCL 10 MG TABS (BENAZEPRIL HCL) 1 by mouth once daily. Prescriptions: BENAZEPRIL HCL 10 MG TABS (BENAZEPRIL HCL) 1 by mouth once daily.  #30 x 0   Entered by:   Army Fossa CMA   Authorized by:   Nolon Rod. Paz MD   Signed by:   Army Fossa CMA on 11/14/2009   Method used:   Electronically to        [facility]* (retail)       [address]       [address]       [place], [place]  [address]       Ph: [phone]       Fax: [phone]   RxID:   [id number]

## 2010-03-14 NOTE — Progress Notes (Signed)
Summary: RESPIRATORY FORM TO BE FILLED OUT  Phone Note Call from Patient   Caller: [facility]--[phone] Summary of Call: [facility] DROPPED OFF FORM FOR DR Arland Usery TO COMPLETE---  SAID IT WAS OK TO FAX FORM BACK TO THEM AT [phone]  GAVE TO DANIELLE IN P[employer] SLEEVE Initial call taken by: Jerolyn Shin,  August 10, 2009 3:57 PM  Follow-up for Phone Call        Placed on your ledge to fill out. Army Fossa CMA  August 11, 2009 7:57 AM done [place] E. Jami Bogdanski MD  August 12, 2009 9:57 AM

## 2010-03-14 NOTE — Progress Notes (Signed)
Summary: change ventolin  Phone Note Outgoing Call   Summary of Call: Fax from [employer] [facility] - Ventolin inhaler not covered, ok to change to proair? Shary Decamp  April 12, 2009 11:00 AM   Follow-up for Phone Call        yes  Follow-[facility]: [facility] E. Rylen Hou MD,  April 12, 2009 11:45 AM    New/Updated Medications: PROAIR HFA 108 (90 BASE) MCG/ACT AERS (ALBUTEROL SULFATE) 2 puffs qid

## 2010-03-14 NOTE — Letter (Signed)
[facility]   Imported By: Lanelle Bal 03/22/2009 07:57:35  _____________________________________________________________________  External Attachment:    Type:   Image     Comment:   External Document

## 2010-03-14 NOTE — Op Note (Signed)
Summary: [facility] [facility]  [facility] [facility]   Imported By: Lanelle Bal 10/13/2009 10:48:20  _____________________________________________________________________  External Attachment:    Type:   Image     Comment:   External Document

## 2010-03-14 NOTE — Progress Notes (Signed)
Summary: NEEDS SAMPLES  Phone Note Call from Patient Call back at Home Phone [phone]   Caller: Patient Summary of Call: ******PATIENT'S FRIEND IS HERE IN BUILDING FOR A RELATIVES APPOINTMENT*****  SPOKE TO PATIENT ON PHONE---SAID HER FRIEND LINDA MANNING WAS COMING IN TODAY AND ASKED IF HER FRIEND COULD PICK UP SAMPLES OF ADVAIR, PROAIR AND SPIRIVA  SAYS SHE HAS BEEN OUT FOR A WHILE AND SHE IS HAVING TROUBLE BREATHING  SHE SAID THAT SHE TOLD DR Joyice Magda THAT HER INSURANCE WOULDNT PAY FOR HER INHALERS, SO HE SAID TO CALL AND HE WOULD GIVE HER SAMPLES Initial call taken by: Jerolyn Shin,  August 16, 2009 11:42 AM  Follow-up for Phone Call        Gave pt 1 Spriva and 1 Advair. Out of Proair.Army Fossa CMA  August 16, 2009 12:43 PM [place] E. Shanara Schnieders MD  August 16, 2009 1:00 PM

## 2010-03-14 NOTE — Progress Notes (Signed)
Summary: refill  Phone Note Refill Request Message from:  Fax from Pharmacy on October 05, 2009 4:25 PM  Refills Requested: Medication #1:  HYDROCODONE-ACETAMINOPHEN 10-325 MG  TABS 1 by mouth  every 6 hours fax [phone] [facility]  Initial call taken by: Okey Regal Spring,  October 05, 2009 4:26 PM  Follow-up for Phone Call        denied, too early, last RF 09-19-09 Follow-up by: Jose E. Paz MD,  October 06, 2009 8:06 AM  Additional Follow-up for Phone Call Additional follow up Details #1::        I spoke with pt and she is aware, she states that she still has some left. Army Fossa CMA  October 06, 2009 9:17 AM

## 2010-03-14 NOTE — Medication Information (Signed)
Summary: Enrollment Confirmation/[facility]  Enrollment Confirmation/[facility]   Imported By: Lanelle Bal 09/27/2009 09:12:22  _____________________________________________________________________  External Attachment:    Type:   Image     Comment:   External Document

## 2010-03-14 NOTE — Letter (Signed)
[facility]   Imported By: Lanelle Bal 05/06/2009 13:56:12  _____________________________________________________________________  External Attachment:    Type:   Image     Comment:   External Document

## 2010-03-14 NOTE — Progress Notes (Signed)
Summary: orders  Phone Note From Other Clinic   Caller: [facility] Summary of Call: orders needed per medicare for 24hour oximetry test to be perform on pt. please fax order to [phone] of verbally call orders to [phone]. pls advise on orders..................Marland KitchenFelecia Deloach CMA  July 27, 2009 11:43 AM   Follow-up for Phone Call        please obtain a reason: is she on oxygen? is she short of  breath? Detavious Rinn E. Maricsa Sammons MD  July 27, 2009 1:17 PM   [facility] OFFICE.....................Marland KitchenFelecia Deloach CMA  July 27, 2009 1:42 PM   Pt was previously put on O2 back in 2008 by dr Drue Novel. pt has since been on O2  but has recently changed insurance carriers and they are requiring pt to be certified by their guidelines. Pt is not currently having any SOB or any symptoms as of now because pt is currently still with O2..........Marland KitchenFelecia Deloach CMA  July 27, 2009 2:54 PM  schedule at 24 hours pulse oximeter on room air DX COPD Brentyn Seehafer E. Stephanye Finnicum MD  July 27, 2009 4:15 PM   Additional Follow-up for Phone Call Additional follow up Details #1::        orders faxed and given verbally to gary.Felecia Deloach CMA  July 27, 2009 4:41 PM.......     New/Updated Medications: * 24 HOURS PULSE OXIMETER ON ROOM AIR DX COPD-496 Prescriptions: 24 HOURS PULSE OXIMETER ON ROOM AIR DX COPD-496  #1 x o   Entered by:   Jeremy Johann CMA   Authorized by:   Nolon Rod. Dewon Mendizabal MD   Signed by:   Jeremy Johann CMA on 07/27/2009   Method used:   Printed then faxed to ...       [facility]  [address] [facility]* (retail)       [address]       [place], [place]  [address]       Ph: [phone] or [phone]       Fax: [phone]   RxID:   [id number]

## 2010-03-14 NOTE — Letter (Signed)
Summary: ROV, wt loss---Surgery  [facility]   Imported By: Lanelle Bal 11/29/2009 15:30:23  _____________________________________________________________________  External Attachment:    Type:   Image     Comment:   External Document

## 2010-03-14 NOTE — Progress Notes (Signed)
Summary: refill  Phone Note Refill Request Message from:  Fax from Pharmacy on December 07, 2009 11:29 AM  Refills Requested: Medication #1:  HYDROCODONE-ACETAMINOPHEN 10-325 MG  TABS 1 by mouth  every 6 hours fax [phone] [facility] - fax [phone]  Initial call taken by: Okey Regal Spring,  December 07, 2009 11:38 AM  Follow-up for Phone Call        Spoke with pt she states that she still has some on hand, she was just trying to make sure that she didnt run out. Pt states that she can wait if doc does not want to fill at this time. Please advise.  Follow-up by: Army Fossa CMA,  December 07, 2009 1:14 PM  Additional Follow-up for Phone Call Additional follow up Details #1::        she had one refill, please call next month  Additional Follow-up by: [facility] E. Paz MD,  December 07, 2009 3:59 PM    Additional Follow-up for Phone Call Additional follow up Details #2::    pt aware. Army Fossa CMA  December 07, 2009 4:01 PM

## 2010-03-14 NOTE — Assessment & Plan Note (Signed)
Summary: RTO 3 MONTHS/CBS   Vital Signs:  Patient profile:   77 year old female Weight:      115 pounds Pulse rate:   79 / minute Pulse rhythm:   regular BP sitting:   130 / 64  (left arm) Cuff size:   regular  Vitals Entered By: Army Fossa CMA (September 19, 2009 9:56 AM) CC: 3 month f/u :Fasting Comments Ortho put her on a muscle relaxer- unsure of name states it is not flexeril. samples of advair and proair.   History of Present Illness: R hip Fx 08-2009, s/p ORIF had pneumonia ,s/p abx  after the admission, she was discharged to her son's house until she got stronger Now   is living again on her own Had physical therapy at home, PT is about to end Ortho put her on a muscle relaxer- unsure of name states it is not flexeril.  chart reviewed 08-24-2009 a ultrasound showed an AAA of 2.6 cm, stable since 2008 08-25-09 TSH was normal 08-29-09 creatinine 0.5, potassium 3.7, hemoglobin 9.7, iron less than 10 She was transfuse one PRBC    Current Medications (verified): 1)  Hydrocodone-Acetaminophen 10-325 Mg  Tabs (Hydrocodone-Acetaminophen) .Marland Kitchen.. 1 By Mouth  Every 6 Hours Fax [phone] 2)  Furosemide 20 Mg  Tabs (Furosemide) .Marland Kitchen.. 1 By Mouth Once A Day 3)  Benazepril Hcl 10 Mg Tabs (Benazepril Hcl) .Marland Kitchen.. 1 By Mouth Once Daily 4)  Clonazepam 0.5 Mg Tabs (Clonazepam) .... 3 By Mouth At Bedtime Fax [phone] 5)  Nasonex 50 Mcg/act  Susp (Mometasone Furoate) .... 2 Sprays Each Nostril Once Daily 6)  Proair Hfa 108 (90 Base) Mcg/act Aers (Albuterol Sulfate) .... 2 Puffs Qid 7)  Advair Diskus 100-50 Mcg/dose  Misc (Fluticasone-Salmeterol) .... Two Times A Day 8)  Albuterol Neb .... Qid 9)  Spiriva Handihaler 18 Mcg  Caps (Tiotropium Bromide Monohydrate) .... One Puff Qd 10)  Otc  Vitamin D and B12 Every Day 11)  Pravachol 40 Mg Tabs (Pravastatin Sodium) .... One By Mouth Daily 12)  Advil 200 Mg Tabs (Ibuprofen) .... 2 By Mouth Two Times A Day 13)  B6 14)  Citalopram Hydrobromide 20 Mg  Tabs (Citalopram Hydrobromide) .... 2 By Mouth Once Daily 15)  Continous Oxygen .... 2 L Per Min.  Allergies: 1)  ! Neurontin (Gabapentin) 2)  * Vytorin  Past History:  Past Medical History: R hip Fx 08-2009, s/p ORIF Breast Cancer, Dx 2007, s/p XRT, on Tamoxifen SCC of the anus s/p excision 11-10, wider excision 03-16-09 HYPERLIPIDEMIA  CHF  COPD   DEPRESSION  BACK PAIN, LUMBAR, CHRONIC  AAA  OSTEOPOROSIS ----> DEXA 02-18-08 : osteoporosis, s/p reclast 02-18-08 INCREASED BLOOD PRESSURE   INSOMNIA h/o anemia, Cscope 2007, saw GI 5-09, had a (-) EGD ESOPHAGEAL STRICTURE   Past Surgical History: Reviewed history from 10/28/2008 and no changes required. Oophorectomy unilateral ARTHROSCOPY, RIGHT KNEE, HX OF (ICD-V45.89) * LEFT PARTIAL MASTECTOMY HYSTERECTOMY (ICD-V88.01) HEMORRHOIDECTOMY, HX OF (ICD-V45.89)  Social History: Reviewed history from 07/31/2007 and no changes required. divorce lives by self not driving since hip Fx 08-2534 ADL, son brings food, limited ADL since 7-11  2 children retired - Psychologist, sport and exercise  Review of Systems       COPD -- needs samples   hypertension-- BP was rather low in the hospital, ACE inhibitor first were held. Ambulatory BPs in the last few days were normal OSTEOPOROSIS ---->   s/p reclast 02-18-08, recommended to have another: declined "i like to get better" (  from Fx) INSOMNIA-- sleeps ok w/ meds , pain  limiting her sleep    General:  Denies fever. Resp:  Denies cough, coughing up blood, and sputum productive. GI:  Denies diarrhea, nausea, and vomiting.  Physical Exam  General:  alert and well-developed.   Lungs:  normal respiratory effort, no intercostal retractions, no accessory muscle use, and normal breath sounds.   Heart:  normal rate, regular rhythm, and no murmur.   Extremities:  no edema Psych:  Oriented X3, memory intact for recent and remote, normally interactive, good eye contact, not anxious appearing, and not depressed  appearing.     Impression & Recommendations:  Problem # 1:  HIP FRACTURE, RIGHT (ICD-820.8) recovering from a right hip fracture last month RF hydrocodone, takes at least 4 tablets a day  Problem # 2:  HYPERTENSION, BENIGN (ICD-401.1) due to low BPs, her benazepril  is on hold see instructions  Her updated medication list for this problem includes:    Furosemide 20 Mg Tabs (Furosemide) .Marland Kitchen... 1 by mouth once a day    Benazepril Hcl 10 Mg Tabs (Benazepril hcl) .Marland Kitchen... 1 by mouth once daily hold-hold  BP today: 130/64 Prior BP: 152/84 (06/17/2009)  Labs Reviewed: K+: 4.1 (06/17/2009) Creat: : 0.7 (06/17/2009)   Chol: 157 (05/04/2008)   HDL: 84.50 (05/04/2008)   LDL: 59 (05/04/2008)   TG: 70.0 (05/04/2008)  Problem # 3:  ANEMIA, IRON DEFICIENCY (ICD-280.9)  08-29-09 hemoglobin was  9.7 and iron  less than 10,  she was not  that anemic and iron was normal 06-2009 she got transfused of one PRBC  at the hospital last month she is unable to take  much  iron by mouth due to constipation Plan: labs refer to hematology for IV iron? MVI  Orders: TLB-B12 + Folate Pnl (16606_30160-F09/NAT) TLB-IBC Pnl (Iron/FE;Transferrin) (83550-IBC) TLB-CBC Platelet - w/Differential (85025-CBCD)  Problem # 4:  COPD (ICD-496) samples, seems to stable, had a pneumonia last month at the time of the  right hip fracture. Last chest x-ray on file clear Her updated medication list for this problem includes:    Proair Hfa 108 (90 Base) Mcg/act Aers (Albuterol sulfate) .Marland Kitchen... 2 puffs qid    Advair Diskus 100-50 Mcg/dose Misc (Fluticasone-salmeterol) .Marland Kitchen..Marland Kitchen Two times a day    Spiriva Handihaler 18 Mcg Caps (Tiotropium bromide monohydrate) ..... One puff qd  Pulmonary Functions Reviewed: O2 sat: 93 (06/17/2009)     Vaccines Reviewed: Pneumovax: Pneumovax (11/13/2003)   Flu Vax: Fluvax 3+ (11/12/2008)  Problem # 5:  AAA (ICD-441.4) ultrasound on 08-24-09 showed a aneurysm of 2.6 cm. No change compared to a CT in  2008  Problem # 6:  OSTEOPOROSIS NOS (ICD-733.00)  overdue for a reclast  injection explained the benefits particularly in light of the recent fracture will schedule   Orders: TLB-BMP (Basic Metabolic Panel-BMET) (80048-METABOL) Misc. Referral (Misc. Ref)  Complete Medication List: 1)  Hydrocodone-acetaminophen 10-325 Mg Tabs (Hydrocodone-acetaminophen) .Marland Kitchen.. 1 by mouth  every 6 hours fax [phone] 2)  Furosemide 20 Mg Tabs (Furosemide) .Marland Kitchen.. 1 by mouth once a day 3)  Benazepril Hcl 10 Mg Tabs (Benazepril hcl) .Marland Kitchen.. 1 by mouth once daily hold-hold 4)  Clonazepam 0.5 Mg Tabs (Clonazepam) .... 3 by mouth at bedtime fax [phone] 5)  Nasonex 50 Mcg/act Susp (Mometasone furoate) .... 2 sprays each nostril once daily 6)  Proair Hfa 108 (90 Base) Mcg/act Aers (Albuterol sulfate) .... 2 puffs qid 7)  Advair Diskus 100-50 Mcg/dose Misc (Fluticasone-salmeterol) .... Two times a  day 8)  Albuterol Neb  .... Qid 9)  Spiriva Handihaler 18 Mcg Caps (Tiotropium bromide monohydrate) .... One puff qd 10)  Otc Vitamin D and B12 Every Day  11)  Pravachol 40 Mg Tabs (Pravastatin sodium) .... One by mouth daily 12)  Advil 200 Mg Tabs (Ibuprofen) .... 2 by mouth two times a day 13)  B6  14)  Citalopram Hydrobromide 20 Mg Tabs (Citalopram hydrobromide) .... 2 by mouth once daily 15)  Continous Oxygen  .... 2 l per min.  Patient Instructions: 1)  Check your blood pressure 2 or 3 times a week. If it is more than 140/85 consistently,please let us know  2)  take a multivitamin with iron  3)  Please schedule a follow-up appointment in 2 months.  Prescriptions: HYDROCODONE-ACETAMINOPHEN 10-325 MG  TABS (HYDROCODONE-ACETAMINOPHEN) 1 by mouth  every 6 hours fax [phone]  #120 x 0   Entered and Authorized by:   Nolon Rod. Paz MD   Signed by:   Nolon Rod. Paz MD on 09/19/2009   Method used:   Print then Give to Patient   RxID:   [id number]

## 2010-03-14 NOTE — Op Note (Signed)
Summary: [facility]  [facility]   Imported By: Lanelle Bal 10/03/2009 08:33:53  _____________________________________________________________________  External Attachment:    Type:   Image     Comment:   External Document

## 2010-03-14 NOTE — Procedures (Signed)
Summary: Oximetry/[facility]  [facility]ximetry/[facility]   Imported By: Lanelle Bal 08/12/2009 15:08:25  _____________________________________________________________________  External Attachment:    Type:   Image     Comment:   External Document

## 2010-03-14 NOTE — Letter (Signed)
Summary: [facility]   Imported By: Sherian Rein 04/20/2009 13:34:59  _____________________________________________________________________  External Attachment:    Type:   Image     Comment:   External Document

## 2010-03-14 NOTE — Assessment & Plan Note (Signed)
Summary: 4 MONTH FOLLOWUP///SPH   Vital Signs:  Patient profile:   77 year old female Height:      65 inches Weight:      117.6 pounds BMI:     19.64 O2 Sat:      93 % Pulse rate:   88 / minute BP sitting:   152 / 84  Vitals Entered By: Shary Decamp (Jun 17, 2009 8:36 AM) CC: rov, not fasting, requesting change lipitor to something generic, insurance will not cover advair (samples given) Comments Patient states she has been weak since her last surgery & having increased anxiety Shary Decamp  Jun 17, 2009 8:40 AM    History of Present Illness: ROV SCC of the anus-- chart reviewed, s/p  wider excision 03-16-09. last  Hg 9.7 2-11, no XRT -chemo planed , soreness still there  HYPERLIPIDEMIA -- good medication compliance , needs a generic for lipitor   COPD  -- symptoms well controlled   DEPRESSION -- not well controlled , crying sometimes , sad; mostly related to family issues and recent surgeries    INSOMNIA-- sleeps fair, meds help most of the time       Current Medications (verified): 1)  Hydrocodone-Acetaminophen 10-325 Mg  Tabs (Hydrocodone-Acetaminophen) .Marland Kitchen.. 1 By Mouth  Every 6 Hours Fax [phone] 2)  Furosemide 20 Mg  Tabs (Furosemide) .Marland Kitchen.. 1 By Mouth Once A Day 3)  Benazepril Hcl 10 Mg Tabs (Benazepril Hcl) .Marland Kitchen.. 1 By Mouth Once Daily 4)  Clonazepam 0.5 Mg Tabs (Clonazepam) .... 3 By Mouth At Bedtime Fax [phone] 5)  Nasonex 50 Mcg/act  Susp (Mometasone Furoate) .... 2 Sprays Each Nostril Once Daily 6)  Proair Hfa 108 (90 Base) Mcg/act Aers (Albuterol Sulfate) .... 2 Puffs Qid 7)  Advair Diskus 100-50 Mcg/dose  Misc (Fluticasone-Salmeterol) .... Two Times A Day 8)  Albuterol Neb .... Qid 9)  Spiriva Handihaler 18 Mcg  Caps (Tiotropium Bromide Monohydrate) .... One Puff Qd 10)  Otc  Vitamin D and B12 Every Day 11)  Lipitor 40 Mg Tabs (Atorvastatin Calcium) .Marland Kitchen.. 1 By Mouth At Bedtime 12)  Advil 200 Mg Tabs (Ibuprofen) .... 2 By Mouth Two Times A Day 13)  B6 14)   Citalopram Hydrobromide 20 Mg Tabs (Citalopram Hydrobromide) .... 2 By Mouth Once Daily  Allergies (verified): 1)  ! Neurontin (Gabapentin) 2)  * Vytorin  Past History:  Past Medical History: SCC of the anus s/p excision 11-10, wider excision 03-16-09  HYPERLIPIDEMIA  CHF  COPD   DEPRESSION  BACK PAIN, LUMBAR, CHRONIC  AAA  OSTEOPOROSIS ----> DEXA 02-18-08 : osteoporosis, s/p reclast 02-18-08 INCREASED BLOOD PRESSURE   INSOMNIA Breast Cancer, Dx 2007, s/p XRT, on Tamoxifen h/o anemia, Cscope 2007, saw GI 5-09, had a (-) EGD ESOPHAGEAL STRICTURE   Past Surgical History: Reviewed history from 10/28/2008 and no changes required. Oophorectomy unilateral ARTHROSCOPY, RIGHT KNEE, HX OF (ICD-V45.89) * LEFT PARTIAL MASTECTOMY HYSTERECTOMY (ICD-V88.01) HEMORRHOIDECTOMY, HX OF (ICD-V45.89)  Social History: Reviewed history from 07/31/2007 and no changes required. divorce lives by self still drive ADL independent 2 children retired - Psychologist, sport and exercise  Review of Systems General:  Complains of fatigue. CV:  Denies chest pain or discomfort and swelling of feet; no orthopnea. Resp:  Denies coughing up blood; occasionally cough w/ sputum (white). GI:  Denies diarrhea and vomiting; occasionally nausea , (+) constipation takes MOM . Psych:  no suicidal.  Physical Exam  General:  alert and well-developed.   Neck:  no JVD.  Lungs:  normal respiratory effort, no intercostal retractions, no accessory muscle use, and normal breath sounds.   Heart:  normal rate, regular rhythm, and no murmur.   Abdomen:  soft and non-tender.   Extremities:  no pretibial edema bilaterally  Psych:  Oriented X3, normally interactive, good eye contact, and not anxious appearing.  no depress (at baseline)   Impression & Recommendations:  Problem # 1:  ANAL CANCER (ICD-154.3)  recovering from two surgeries, no XRT or chemotherapy planned  Problem # 2:  HYPERTENSION, BENIGN (ICD-401.1)  BP slightly elevated  today Her updated medication list for this problem includes:    Furosemide 20 Mg Tabs (Furosemide) .Marland Kitchen... 1 by mouth once a day    Benazepril Hcl 10 Mg Tabs (Benazepril hcl) .Marland Kitchen... 1 by mouth once daily    BP today: 152/84 Prior BP: 120/80 (02/18/2009)  Labs Reviewed: K+: 3.8 (10/08/2008) Creat: : 0.8 (10/08/2008)   Chol: 157 (05/04/2008)   HDL: 84.50 (05/04/2008)   LDL: 59 (05/04/2008)   TG: 70.0 (05/04/2008)  Orders: TLB-BMP (Basic Metabolic Panel-BMET) (80048-METABOL)  Problem # 3:  FATIGUE (ICD-780.79)  likely multifactorial, anemia, depression.  No signs of vol. overload.  Orders: TLB-TSH (Thyroid Stimulating Hormone) (84443-TSH)  Problem # 4:  HYPERLIPIDEMIA (ICD-272.4) needs a generic, switched to Pravachol Her updated medication list for this problem includes:    Pravachol 40 Mg Tabs (Pravastatin sodium) ..... One by mouth daily  Labs Reviewed: SGOT: 19 (10/08/2008)   SGPT: 15 (10/08/2008)   HDL:84.50 (05/04/2008), 78.0 (12/09/2007)  LDL:59 (05/04/2008), DEL (44/04/4740)  Chol:157 (05/04/2008), 252 (12/09/2007)  Trig:70.0 (05/04/2008), 84 (12/09/2007)  Problem # 5:  DEPRESSION (ICD-311) not as well controlled as patient desires  chart is reviewed, previously tried zoloft  which she didn't like, also used Lexapro she needss generic medication Plan: No change for now (she actually seems at baseline) ---rec counseling  Her updated medication list for this problem includes:    Clonazepam 0.5 Mg Tabs (Clonazepam) .Marland KitchenMarland KitchenMarland KitchenMarland Kitchen 3 by mouth at bedtime fax [phone]    Citalopram Hydrobromide 20 Mg Tabs (Citalopram hydrobromide) .Marland Kitchen... 2 by mouth once daily  Complete Medication List: 1)  Hydrocodone-acetaminophen 10-325 Mg Tabs (Hydrocodone-acetaminophen) .Marland Kitchen.. 1 by mouth  every 6 hours fax [phone] 2)  Furosemide 20 Mg Tabs (Furosemide) .Marland Kitchen.. 1 by mouth once a day 3)  Benazepril Hcl 10 Mg Tabs (Benazepril hcl) .Marland Kitchen.. 1 by mouth once daily 4)  Clonazepam 0.5 Mg Tabs (Clonazepam) .... 3  by mouth at bedtime fax [phone] 5)  Nasonex 50 Mcg/act Susp (Mometasone furoate) .... 2 sprays each nostril once daily 6)  Proair Hfa 108 (90 Base) Mcg/act Aers (Albuterol sulfate) .... 2 puffs qid 7)  Advair Diskus 100-50 Mcg/dose Misc (Fluticasone-salmeterol) .... Two times a day 8)  Albuterol Neb  .... Qid 9)  Spiriva Handihaler 18 Mcg Caps (Tiotropium bromide monohydrate) .... One puff qd 10)  Otc Vitamin D and B12 Every Day  11)  Pravachol 40 Mg Tabs (Pravastatin sodium) .... One by mouth daily 12)  Advil 200 Mg Tabs (Ibuprofen) .... 2 by mouth two times a day 13)  B6  14)  Citalopram Hydrobromide 20 Mg Tabs (Citalopram hydrobromide) .... 2 by mouth once daily  Other Orders: Venipuncture (59563) TLB-IBC Pnl (Iron/FE;Transferrin) (83550-IBC) TLB-Hemoglobin (Hgb) (85018-HGB)  Patient Instructions: 1)  Please schedule a follow-up appointment in 3 months .  Prescriptions: PRAVACHOL 40 MG TABS (PRAVASTATIN SODIUM) one by mouth daily  #90 x 1   Entered and Authorized by:   Elita Quick  Elisabeth Cara MD   Signed by:   Nolon Rod. Curly Mackowski MD on 06/17/2009   Method used:   Print then Give to Patient   RxID:   [id number]

## 2010-03-14 NOTE — Letter (Signed)
Summary: [facility]   Imported By: Lester [place] 02/17/2009 10:39:15  _____________________________________________________________________  External Attachment:    Type:   Image     Comment:   External Document

## 2010-03-14 NOTE — Medication Information (Signed)
Summary: Patient Assistance Form/[facility]  Patient Assistance Form/[facility]   Imported By: Lanelle Bal 10/06/2009 11:45:49  _____________________________________________________________________  External Attachment:    Type:   Image     Comment:   External Document

## 2010-03-14 NOTE — Letter (Signed)
Summary: CMN for Oxygen/[facility]  CMN for Oxygen/[facility]   Imported By: Lanelle Bal 08/24/2009 10:57:22  _____________________________________________________________________  External Attachment:    Type:   Image     Comment:   External Document

## 2010-03-14 NOTE — Assessment & Plan Note (Signed)
Summary: flu shot///sph   Nurse Visit  CC: Flu shot./kb   Allergies: 1)  ! Neurontin (Gabapentin) 2)  * Vytorin  Orders Added: 1)  Flu Vaccine 37yrs + MEDICARE PATIENTS [Q2039] 2)  Administration Flu vaccine - MCR [G0008]               Flu Vaccine Consent Questions     Do you have a history of severe allergic reactions to this vaccine? no    Any prior history of allergic reactions to egg and/or gelatin? no    Do you have a sensitivity to the preservative Thimersol? no    Do you have a past history of Guillan-Barre Syndrome? no    Do you currently have an acute febrile illness? no    Have you ever had a severe reaction to latex? no    Vaccine information given and explained to patient? yes    Are you currently pregnant? no    Lot Number:[id number]   Exp Date:08/12/2010   Site Given  Right Deltoid IMu

## 2010-03-14 NOTE — Letter (Signed)
Summary: [facility]  [facility]   Imported By: Lanelle Bal 03/04/2009 09:58:43  _____________________________________________________________________  External Attachment:    Type:   Image     Comment:   External Document

## 2010-03-14 NOTE — Letter (Signed)
Summary: Encounter Notice / [facility]  Encounter Notice / [facility]   Imported By: Lennie Odor 09/09/2009 09:42:43  _____________________________________________________________________  External Attachment:    Type:   Image     Comment:   External Document

## 2010-03-14 NOTE — Letter (Signed)
Summary: Dr Liz Malady Office Note  Dr Liz Malady Office Note   Imported By: Roderic Ovens 03/08/2009 11:54:48  _____________________________________________________________________  External Attachment:    Type:   Image     Comment:   External Document

## 2010-03-14 NOTE — Assessment & Plan Note (Signed)
Summary: FOR SUGAR CHECK--PH   Nurse Visit   Allergies: 1)  ! Neurontin (Gabapentin) 2)  * Vytorin Laboratory Results   Blood Tests    Date/Time Reported: Shary Decamp  Jun 22, 2009 9:45 AM   Glucose (fasting): 99 mg/dL   (Normal Range: 01-027)     Orders Added: 1)  Fingerstick [36416] 2)  Glucose, (CBG) [82962]  Appended Document: FOR SUGAR CHECK--PH normal fasting blood sugar

## 2010-03-14 NOTE — Letter (Signed)
Summary: [facility]   Imported By: Lester [place] 02/17/2009 10:52:58  _____________________________________________________________________  External Attachment:    Type:   Image     Comment:   External Document

## 2010-03-14 NOTE — Letter (Signed)
Summary: Dr Gabrielle Dare Thompson's Office Note  Dr Gabrielle Dare Thompson's Office Note   Imported By: Roderic Ovens 03/29/2009 12:27:46  _____________________________________________________________________  External Attachment:    Type:   Image     Comment:   External Document

## 2010-03-14 NOTE — Progress Notes (Signed)
Summary: refill   Phone Note Refill Request Message from:  Fax from Pharmacy on December 20, 2009 11:24 AM  Refills Requested: Medication #1:  NASONEX 50 MCG/ACT  SUSP 2 sprays each nostril once daily [facility] - fax [phone] -note from  pharmacy -  patient wants to change to flonase - copay for nasonex is $45.00  Initial call taken by: Okey Regal [place],  December 20, 2009 11:28 AM  Follow-up for Phone Call        okay to change to Flonase, 2 sprays on each side of the nose daily Follow-up by: [facility] E. Paz MD,  December 20, 2009 4:54 PM    New/Updated Medications: FLONASE 50 MCG/ACT SUSP (FLUTICASONE PROPIONATE) 2 sprays on each side of the nose daily. Prescriptions: FLONASE 50 MCG/ACT SUSP (FLUTICASONE PROPIONATE) 2 sprays on each side of the nose daily.  #1 x 3   Entered by:   Army Fossa CMA   Authorized by:   Nolon Rod. Paz MD   Signed by:   Army Fossa CMA on 12/21/2009   Method used:   Electronically to        [facility]* (retail)       [address]       [address]       [place], [place]  [address]       Ph: [phone]       Fax: [phone]   RxID:   [id number]

## 2010-03-14 NOTE — Progress Notes (Signed)
Summary: Hydrocodone Refill  Phone Note Refill Request Message from:  Patient on October 24, 2009 9:40 AM  Refills Requested: Medication #1:  HYDROCODONE-ACETAMINOPHEN 10-325 MG  TABS 1 by mouth  every 6 hours fax [phone]   Last Refilled: 09/19/2009 [facility]   Method Requested: Fax to Local Pharmacy Initial call taken by: Army Fossa CMA,  October 24, 2009 9:40 AM  Follow-up for Phone Call        FAX FROM PHARMACY STATES THIRD REQUEST--SAYS PATIENT IS OUT OF MEDS Follow-up by: Jerolyn Shin,  October 24, 2009 12:19 PM  Additional Follow-up for Phone Call Additional follow up Details #1::        ok  #120, one refill Additional Follow-up by: [facility] E. Paz MD,  October 24, 2009 4:01 PM    Prescriptions: HYDROCODONE-ACETAMINOPHEN 10-325 MG  TABS (HYDROCODONE-ACETAMINOPHEN) 1 by mouth  every 6 hours fax [phone]  #120 x 1   Entered by:   Army Fossa CMA   Authorized by:   Nolon Rod. Paz MD   Signed by:   Army Fossa CMA on 10/24/2009   Method used:   Printed then faxed to ...       [facility]* (retail)       [address]       [address]       [place], [place]  [address]       Ph: [phone]       Fax: [phone]   RxID:   [id number]

## 2010-03-14 NOTE — Assessment & Plan Note (Signed)
Summary: 2 MONTH FOLLOWUP/KN   Vital Signs:  Patient profile:   77 year old female Weight:      107.38 pounds Pulse rate:   96 / minute Pulse rhythm:   regular BP sitting:   128 / 82  (left arm) Cuff size:   regular  Vitals Entered By: Army Fossa CMA (November 21, 2009 9:56 AM) CC: 2 month f/u on BP- fasting Comments Pharm- [facility]   History of Present Illness: ROV chart reviewed   SCC of the anus -- doing well , sees surgery , occasional  constipation    HTN -- ambulatory BPs 120-130/80, well controlled, not on ACEi, takes lasix as needed    COPD  -- $$ of meds is an  issue, runned out last week    OSTEOPOROSIS ---->  s/p reclast   09-2009  h/o anemia,  labs 09/2009 showed resolution of anemia    Current Medications (verified): 1)  Hydrocodone-Acetaminophen 10-325 Mg  Tabs (Hydrocodone-Acetaminophen) .Marland Kitchen.. 1 By Mouth  Every 6 Hours Fax [phone] 2)  Furosemide 20 Mg  Tabs (Furosemide) .Marland Kitchen.. 1 By Mouth Once A Day 3)  Clonazepam 0.5 Mg Tabs (Clonazepam) .... 3 By Mouth At Bedtime Fax [phone] 4)  Nasonex 50 Mcg/act  Susp (Mometasone Furoate) .... 2 Sprays Each Nostril Once Daily 5)  Proair Hfa 108 (90 Base) Mcg/act Aers (Albuterol Sulfate) .... 2 Puffs Qid 6)  Advair Diskus 100-50 Mcg/dose  Misc (Fluticasone-Salmeterol) .... Two Times A Day 7)  Albuterol Neb .... Qid 8)  Spiriva Handihaler 18 Mcg  Caps (Tiotropium Bromide Monohydrate) .... One Puff Qd 9)  Otc  Vitamin D and B12 Every Day 10)  Pravachol 40 Mg Tabs (Pravastatin Sodium) .... One By Mouth Daily 11)  Advil 200 Mg Tabs (Ibuprofen) .... 2 By Mouth Two Times A Day 12)  B6 13)  Citalopram Hydrobromide 20 Mg Tabs (Citalopram Hydrobromide) .... 2 By Mouth Once Daily 14)  Continous Oxygen .... 2 L Per Min.  Allergies (verified): 1)  ! Neurontin (Gabapentin) 2)  * Vytorin  Past History:  Past Medical History: R hip Fx 08-2009, s/p ORIF Breast Cancer, Dx 2007, s/p XRT, on Tamoxifen SCC of the anus s/p  excision 11-10, wider excision 03-16-09 HYPERLIPIDEMIA  CHF  COPD   DEPRESSION  BACK PAIN, LUMBAR, CHRONIC  AAA  OSTEOPOROSIS ----> DEXA 02-18-08 : osteoporosis, s/p reclast 02-18-08 and 09-2009 HTN    INSOMNIA h/o anemia, Cscope 2007, saw GI 5-09, had a (-) EGD ESOPHAGEAL STRICTURE   Past Surgical History: Reviewed history from 10/28/2008 and no changes required. Oophorectomy unilateral ARTHROSCOPY, RIGHT KNEE, HX OF (ICD-V45.89) * LEFT PARTIAL MASTECTOMY HYSTERECTOMY (ICD-V88.01) HEMORRHOIDECTOMY, HX OF (ICD-V45.89)  Social History: divorce lives by self not driving since hip Fx 09-1189 ADL, son brings food, limited ADL since 7-11  2 children retired - Psychologist, sport and exercise quit tobacco-- aprox 2006  Review of Systems General:  Denies fever; loosing wt despite ensure twice a day, appetite and p.o. intake seems ok per patient . CV:  Denies chest pain or discomfort and swelling of feet. Resp:  Denies cough and shortness of breath. GI:  Denies bloody stools, diarrhea, and nausea; no post prandial abd pain. Psych:  admits to depression, lack of motivation, don't care about cleaning her house occasionally crying  no suicidal  symptoms related to her health?.  Physical Exam  General:  alert, well-developed, and underweight appearing.   Neck:  no masses and normal carotid upstroke.   Lungs:  normal respiratory  effort, no intercostal retractions, and no accessory muscle use.  decreased breath sounds Heart:  normal rate, regular rhythm, and no murmur.   Extremities:  no lower extremity edema Psych:  not anxious appearing and not depressed appearing.     Impression & Recommendations:  Problem # 1:  WEIGHT LOSS (ICD-783.21) steady weight loss for at least a year. initially, I thought weight loss was related to anal  cancer, then the hip fracture and the fact that she was not eating well. Today she is clearly depress, will switch her meds and reasses in 6 weeks  TSH has been normal. Anemia  has resolved.  Problem # 2:  HYPERTENSION, BENIGN (ICD-401.1) off ACE inhibitor, on p.r.n. Lasix The following medications were removed from the medication list:    Benazepril Hcl 10 Mg Tabs (Benazepril hcl) .Marland Kitchen... 1 by mouth once daily. Her updated medication list for this problem includes:    Furosemide 20 Mg Tabs (Furosemide) .Marland Kitchen... 1 by mouth once a day  BP today: 128/82 Prior BP: 130/64 (09/19/2009)  Labs Reviewed: K+: 4.4 (09/19/2009) Creat: : 0.7 (09/19/2009)   Chol: 157 (05/04/2008)   HDL: 84.50 (05/04/2008)   LDL: 59 (05/04/2008)   TG: 70.0 (05/04/2008)  Problem # 3:  HYPERLIPIDEMIA (ICD-272.4) due for labs Her updated medication list for this problem includes:    Pravachol 40 Mg Tabs (Pravastatin sodium) ..... One by mouth daily  Labs Reviewed: SGOT: 19 (10/08/2008)   SGPT: 15 (10/08/2008)   HDL:84.50 (05/04/2008), 78.0 (12/09/2007)  LDL:59 (05/04/2008), DEL (57/84/6962)  Chol:157 (05/04/2008), 252 (12/09/2007)  Trig:70.0 (05/04/2008), 84 (12/09/2007)  Orders: TLB-ALT (SGPT) (84460-ALT) TLB-AST (SGOT) (84450-SGOT) TLB-Lipid Panel (80061-LIPID) Venipuncture (95284) Specimen Handling (13244)  Problem # 4:  CHF (ICD-428.0) history of CHF, currently euvolemic The following medications were removed from the medication list:    Benazepril Hcl 10 Mg Tabs (Benazepril hcl) .Marland Kitchen... 1 by mouth once daily. Her updated medication list for this problem includes:    Furosemide 20 Mg Tabs (Furosemide) .Marland Kitchen... 1 by mouth once a day  Problem # 5:  COPD (ICD-496) well controlled, has a hard time affording medicines. Samples will be provided if available Her updated medication list for this problem includes:    Proair Hfa 108 (90 Base) Mcg/act Aers (Albuterol sulfate) .Marland Kitchen... 2 puffs qid    Advair Diskus 100-50 Mcg/dose Misc (Fluticasone-salmeterol) .Marland Kitchen..Marland Kitchen Two times a day    Spiriva Handihaler 18 Mcg Caps (Tiotropium bromide monohydrate) ..... One puff qd  Problem # 6:  DEPRESSION  (ICD-311) symptoms not well controlled , will put her back on sertraline which she took years ago, apparently made her slightly  nervous but I hope will give her motivation see instructions  Her updated medication list for this problem includes:    Clonazepam 0.5 Mg Tabs (Clonazepam) .Marland KitchenMarland KitchenMarland KitchenMarland Kitchen 3 by mouth at bedtime fax [phone]    Sertraline Hcl 50 Mg Tabs (Sertraline hcl) .Marland Kitchen... As directed  Problem # 7:  OSTEOPOROSIS NOS (ICD-733.00) had her last  reclast 09-2009  Complete Medication List: 1)  Hydrocodone-acetaminophen 10-325 Mg Tabs (Hydrocodone-acetaminophen) .Marland Kitchen.. 1 by mouth  every 6 hours fax [phone] 2)  Furosemide 20 Mg Tabs (Furosemide) .Marland Kitchen.. 1 by mouth once a day 3)  Clonazepam 0.5 Mg Tabs (Clonazepam) .... 3 by mouth at bedtime fax [phone] 4)  Nasonex 50 Mcg/act Susp (Mometasone furoate) .... 2 sprays each nostril once daily 5)  Proair Hfa 108 (90 Base) Mcg/act Aers (Albuterol sulfate) .... 2 puffs qid 6)  Advair Diskus 100-50 Mcg/dose  Misc (Fluticasone-salmeterol) .... Two times a day 7)  Albuterol Neb  .... Qid 8)  Spiriva Handihaler 18 Mcg Caps (Tiotropium bromide monohydrate) .... One puff qd 9)  Otc Vitamin D and B12 Every Day  10)  Pravachol 40 Mg Tabs (Pravastatin sodium) .... One by mouth daily 11)  Advil 200 Mg Tabs (Ibuprofen) .... 2 by mouth two times a day 12)  B6  13)  Sertraline Hcl 50 Mg Tabs (Sertraline hcl) .... As directed 14)  Continous Oxygen  .... 2 l per min.  Other Orders: Pneumococcal Vaccine (08657) Admin 1st Vaccine (84696) Admin 1st Vaccine [facility]) [phone]  Patient Instructions: 1)   x 10 days: take citalopram 20mg  AND sertraline 50mg  every day 2)  after 10 days, stop cotalopram and take sertraline 2 tablets a day 3)  call if side effects 4)  Please schedule a follow-up appointment in 1 month.  Prescriptions: PRAVACHOL 40 MG TABS (PRAVASTATIN SODIUM) one by mouth daily  #90 x 1   Entered by:   Army Fossa CMA   Authorized by:   Nolon Rod. Paz  MD   Signed by:   Army Fossa CMA on 11/21/2009   Method used:   Electronically to        [facility]* (retail)       [address]       [address]       [place], [place]  [address]       Ph: [phone]       Fax: [phone]   RxID:   [id number] ADVAIR DISKUS 100-50 MCG/DOSE  MISC (FLUTICASONE-SALMETEROL) two times a day  #1 x 3   Entered by:   Army Fossa CMA   Authorized by:   Nolon Rod. Paz MD   Signed by:   Army Fossa CMA on 11/21/2009   Method used:   Electronically to        [facility]* (retail)       [address]       [address]       [place], [place]  [address]       Ph: [phone]       Fax: [phone]   RxID:   [id number] NASONEX 50 MCG/ACT  SUSP (MOMETASONE FUROATE) 2 sprays each nostril once daily  #1 x 3   Entered by:   Army Fossa CMA   Authorized by:   Nolon Rod. Paz MD   Signed by:   Army Fossa CMA on 11/21/2009   Method used:   Electronically to        [facility]* (retail)       [address]       [address]       [place], [place]  [address]       Ph: [phone]       Fax: [phone]   RxID:   [id number] FUROSEMIDE 20 MG  TABS (FUROSEMIDE) 1 by mouth once a day  #90 x 1   Entered by:   Army Fossa CMA   Authorized by:   Nolon Rod. Paz MD   Signed by:   Army Fossa CMA on 11/21/2009   Method used:   Electronically to        [facility]* (retail)       [address]       [address]       [place], [place]  [address]       Ph: [phone]       Fax: [phone]   RxID:   [id number] SERTRALINE HCL 50 MG TABS (SERTRALINE HCL) as directed  #60 x 0   Entered and Authorized by:   Nolon Rod. Paz MD   Signed by:   Nolon Rod. Paz MD on 11/21/2009   Method used:   Print then Give to Patient   RxID:   [id number]    Pneumovax Vaccine    Vaccine Type: Pneumovax    Site: right deltoid    Mfr: Merck    Dose: 0.5 ml    Route: IM    Given by:  Army Fossa CMA    Exp. Date: 05/01/2011    Lot #: [id number]

## 2010-03-14 NOTE — Assessment & Plan Note (Signed)
Summary: rto - 1 month/cbs   Vital Signs:  Patient profile:   77 year old female Weight:      110.38 pounds O2 Sat:      97 % on Room air Pulse rate:   92 / minute Pulse rhythm:   regular BP sitting:   124 / 84  (left arm) Cuff size:   regular  Vitals Entered By: Army Fossa CMA (December 19, 2009 9:18 AM)  O2 Flow:  Room air CC: 1 month f/u Comments unable to sleep since starting the Zoloft. refill on hydrocodone [facility]   History of Present Illness: followup from last office visit She was switched from citalopram to sertraline, could not tolerate it, she was unable to sleep. She went  back to citalopram 3 to 4 days after the switch  Review of systems Appetite is only "so-so" COPD seems to be okay, uses oxygen at night only, she only coughs in the morning. No wheezing She tolerated her flu shot As far as her mood , she is still slightly depressed but not suicidal "I can't tell you what works: Librium, I took it for years and it helped"  Current Medications (verified): 1)  Hydrocodone-Acetaminophen 10-325 Mg  Tabs (Hydrocodone-Acetaminophen) .Marland Kitchen.. 1 By Mouth  Every 6 Hours Fax [phone] 2)  Furosemide 20 Mg  Tabs (Furosemide) .Marland Kitchen.. 1 By Mouth Once A Day 3)  Clonazepam 0.5 Mg Tabs (Clonazepam) .... 3 By Mouth At Bedtime Fax [phone] 4)  Nasonex 50 Mcg/act  Susp (Mometasone Furoate) .... 2 Sprays Each Nostril Once Daily 5)  Proair Hfa 108 (90 Base) Mcg/act Aers (Albuterol Sulfate) .... 2 Puffs Qid 6)  Advair Diskus 100-50 Mcg/dose  Misc (Fluticasone-Salmeterol) .... Two Times A Day 7)  Albuterol Neb .... Qid 8)  Spiriva Handihaler 18 Mcg  Caps (Tiotropium Bromide Monohydrate) .... One Puff Qd 9)  Otc  Vitamin D and B12 Every Day 10)  Pravachol 40 Mg Tabs (Pravastatin Sodium) .... One By Mouth Daily 11)  Advil 200 Mg Tabs (Ibuprofen) .... 2 By Mouth Two Times A Day 12)  B6 13)  Sertraline Hcl 50 Mg Tabs (Sertraline Hcl) .... As Directed 14)  Continous  Oxygen .... 2 L Per Min.  Allergies (verified): 1)  ! Neurontin (Gabapentin) 2)  * Vytorin  Past History:  Past Medical History: R hip Fx 08-2009, s/p ORIF Breast Cancer, Dx 2007, s/p XRT, on Tamoxifen SCC of the anus s/p excision 11-10, wider excision 03-16-09 HYPERLIPIDEMIA  CHF  COPD  ------------- O2 at night  DEPRESSION  BACK PAIN, LUMBAR, CHRONIC  AAA  OSTEOPOROSIS ----> DEXA 02-18-08 : osteoporosis, s/p reclast 02-18-08 and 09-2009 HTN    INSOMNIA h/o anemia, Cscope 2007, saw GI 5-09, had a (-) EGD ESOPHAGEAL STRICTURE   Past Surgical History: Reviewed history from 10/28/2008 and no changes required. Oophorectomy unilateral ARTHROSCOPY, RIGHT KNEE, HX OF (ICD-V45.89) * LEFT PARTIAL MASTECTOMY HYSTERECTOMY (ICD-V88.01) HEMORRHOIDECTOMY, HX OF (ICD-V45.89)  Social History: Reviewed history from 11/21/2009 and no changes required. 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   Lungs:  normal respiratory effort, no intercostal retractions, and no accessory muscle use.  decreased breath sounds but otherwise clear Psych:  Oriented X3, memory intact for recent and remote, normally interactive, good eye contact, and not anxious appearing.  Impression & Recommendations:  Problem # 1:  DEPRESSION (ICD-311) symptoms were not well controlled, she  switched from  citalopram to sertraline but could not tolerate it. She is back on citalopram. see  review of systems, pt  states that librium  helps. Plan: Discontinue clonazepam Start librium  as needed, we discussed somnolence Her updated medication list for this problem includes:    Chlordiazepoxide Hcl 5 Mg Caps (Chlordiazepoxide hcl) .Marland Kitchen... What by mouth 3 times a day as needed    Citalopram Hydrobromide 20 Mg Tabs (Citalopram hydrobromide) .Marland Kitchen... 1.5 tablets  daily  Problem # 2:  WEIGHT LOSS (ICD-783.21) weight has increased a few pounds. We'll continue monitoring  Problem # 3:  COPD (ICD-496) samples for advair provided  Her updated medication list for this problem includes:    Proair Hfa 108 (90 Base) Mcg/act Aers (Albuterol sulfate) .Marland Kitchen... 2 puffs qid    Advair Diskus 100-50 Mcg/dose Misc (Fluticasone-salmeterol) .Marland Kitchen..Marland Kitchen Two times a day    Spiriva Handihaler 18 Mcg Caps (Tiotropium bromide monohydrate) ..... One puff qd  Complete Medication List: 1)  Hydrocodone-acetaminophen 10-325 Mg Tabs (Hydrocodone-acetaminophen) .Marland Kitchen.. 1 by mouth  every 6 hours fax [phone] 2)  Furosemide 20 Mg Tabs (Furosemide) .Marland Kitchen.. 1 by mouth once a day 3)  Chlordiazepoxide Hcl 5 Mg Caps (Chlordiazepoxide hcl) .... What by mouth 3 times a day as needed 4)  Nasonex 50 Mcg/act Susp (Mometasone furoate) .... 2 sprays each nostril once daily 5)  Proair Hfa 108 (90 Base) Mcg/act Aers (Albuterol sulfate) .... 2 puffs qid 6)  Advair Diskus 100-50 Mcg/dose Misc (Fluticasone-salmeterol) .... Two times a day 7)  Albuterol Neb  .... Qid 8)  Spiriva Handihaler 18 Mcg Caps (Tiotropium bromide monohydrate) .... One puff qd 9)  Otc Vitamin D and B12 Every Day  10)  Pravachol 40 Mg Tabs (Pravastatin sodium) .... One by mouth daily 11)  Advil 200 Mg Tabs (Ibuprofen) .... 2 by mouth two times a day 12)  B6  13)  Citalopram Hydrobromide 20 Mg Tabs (Citalopram hydrobromide) .... 1.5 tablets daily 14)  Continous Oxygen  .... 2 l per min.  Patient Instructions: 1)  increase citalopram to 1.5 tablets daily 2)  Start CHLORDIAZEPOXIDE 3 times a day as needed for anxiety-depression - difficulty sleeping 3)  come back in 2 months Prescriptions: HYDROCODONE-ACETAMINOPHEN 10-325 MG  TABS (HYDROCODONE-ACETAMINOPHEN) 1 by mouth  every 6 hours fax [phone]  #120 x 1   Entered and Authorized by:   Elita Quick E. Paz MD   Signed by:   Nolon Rod. Paz MD on 12/19/2009   Method used:   Print then Give to  Patient   RxID:   [id number] CHLORDIAZEPOXIDE HCL 5 MG CAPS (CHLORDIAZEPOXIDE HCL) what by mouth 3 times a day as needed  #90 x 0   Entered and Authorized by:   Nolon Rod. Paz MD   Signed by:   Nolon Rod. Paz MD on 12/19/2009   Method used:   Print then Give to Patient   RxID:   [id number]    Orders Added: 1)  Est. Patient Level III [95284]

## 2010-03-14 NOTE — Letter (Signed)
Summary: [facility]  [facility]   Imported By: Lanelle Bal 02/23/2009 15:19:11  _____________________________________________________________________  External Attachment:    Type:   Image     Comment:   External Document

## 2010-03-14 NOTE — Assessment & Plan Note (Signed)
Summary: 4 month roa//lh   Vital Signs:  Patient profile:   77 year old female Height:      65 inches Weight:      119.6 pounds Pulse rate:   76 / minute BP sitting:   120 / 80  Vitals Entered By: Shary Decamp (February 18, 2009 8:28 AM) CC: rov   History of Present Illness: SCC anus surgery notes reviewed, s/p eval at the cancer center  facing  more extensive surgery, hopefuly if the surgery is succesful she will avoid XRT-chemo  DEPRESSION -- meds helping , still depressive symptoms on-off  BACK PAIN-- chronic,  still there    Current Medications (verified): 1)  Hydrocodone-Acetaminophen 10-325 Mg  Tabs (Hydrocodone-Acetaminophen) .Marland Kitchen.. 1 By Mouth  Every 6 Hours 2)  Furosemide 20 Mg  Tabs (Furosemide) .Marland Kitchen.. 1 By Mouth Once A Day 3)  Benazepril Hcl 10 Mg Tabs (Benazepril Hcl) .Marland Kitchen.. 1 By Mouth Once Daily 4)  Clonazepam 0.5 Mg Tabs (Clonazepam) .... 3 By Mouth At Bedtime Fax [phone] 5)  Nasonex 50 Mcg/act  Susp (Mometasone Furoate) .... 2 Sprays Each Nostril Once Daily 6)  Ventolin Hfa 108 (90 Base) Mcg/act  Aers (Albuterol Sulfate) .... 2 Puffs Qid Prn 7)  Advair Diskus 100-50 Mcg/dose  Misc (Fluticasone-Salmeterol) .... Two Times A Day 8)  Albuterol Neb .... Qid 9)  Spiriva Handihaler 18 Mcg  Caps (Tiotropium Bromide Monohydrate) .... One Puff Qd 10)  Otc  Vitamin D and B12 Every Day 11)  Lipitor 40 Mg Tabs (Atorvastatin Calcium) .Marland Kitchen.. 1 By Mouth At Bedtime 12)  Advil 200 Mg Tabs (Ibuprofen) .... 2 By Mouth Two Times A Day 13)  B6 14)  Citalopram Hydrobromide 20 Mg Tabs (Citalopram Hydrobromide) .... 2 By Mouth Once Daily  Allergies (verified): 1)  ! Neurontin (Gabapentin) 2)  * Vytorin  Past History:  Past Medical History: SCC of the anus s/p excision 11-10  HYPERLIPIDEMIA  CHF  COPD   DEPRESSION  BACK PAIN, LUMBAR, CHRONIC  AAA  OSTEOPOROSIS ----> DEXA 02-18-08 : osteoporosis, s/p reclast 02-18-08 INCREASED BLOOD PRESSURE   INSOMNIA Breast Cancer, Dx 2007, s/p  XRT, on Tamoxifen h/o anemia, Cscope 2007, saw GI 5-09, had a (-) EGD ESOPHAGEAL STRICTURE   Social History: Reviewed history from 07/31/2007 and no changes required. divorce lives by self still drive ADL independent 2 children retired - Psychologist, sport and exercise  Review of Systems General:  still quite fatigued . Resp:  cough and SOB  at baseline, occasionally white sputum .  Physical Exam  General:  alert and well-developed.   Lungs:  normal respiratory effort, no intercostal retractions, no accessory muscle use, and normal breath sounds.   Heart:  normal rate, regular rhythm, and no murmur.   Extremities:  no pretibial edema bilaterally    Impression & Recommendations:  Problem # 1:  ANAL CANCER (ICD-154.3) see HPI psychological support provided , permanent parking permit provided as well  Problem # 2:  FATIGUE (ICD-780.79) still fatigued , labs   Orders: Venipuncture ([address]) TLB-B12 + Folate Pnl (98119_14782-N56/OZH) TLB-CBC Platelet - w/Differential (85025-CBCD) TLB-IBC Pnl (Iron/FE;Transferrin) (83550-IBC)  Problem # 3:  DEPRESSION (ICD-311) has good day and bad days, red to talk w/  the couinselor at the cancer center Her updated medication list for this problem includes:    Clonazepam 0.5 Mg Tabs (Clonazepam) .Marland KitchenMarland KitchenMarland KitchenMarland Kitchen 3 by mouth at bedtime fax [phone]    Citalopram Hydrobromide 20 Mg Tabs (Citalopram hydrobromide) .Marland Kitchen... 2 by mouth once daily  Problem #  4:  other prob stable   Complete Medication List: 1)  Hydrocodone-acetaminophen 10-325 Mg Tabs (Hydrocodone-acetaminophen) .Marland Kitchen.. 1 by mouth  every 6 hours 2)  Furosemide 20 Mg Tabs (Furosemide) .Marland Kitchen.. 1 by mouth once a day 3)  Benazepril Hcl 10 Mg Tabs (Benazepril hcl) .Marland Kitchen.. 1 by mouth once daily 4)  Clonazepam 0.5 Mg Tabs (Clonazepam) .... 3 by mouth at bedtime fax [phone] 5)  Nasonex 50 Mcg/act Susp (Mometasone furoate) .... 2 sprays each nostril once daily 6)  Ventolin Hfa 108 (90 Base) Mcg/act Aers (Albuterol sulfate)  .... 2 puffs qid prn 7)  Advair Diskus 100-50 Mcg/dose Misc (Fluticasone-salmeterol) .... Two times a day 8)  Albuterol Neb  .... Qid 9)  Spiriva Handihaler 18 Mcg Caps (Tiotropium bromide monohydrate) .... One puff qd 10)  Otc Vitamin D and B12 Every Day  11)  Lipitor 40 Mg Tabs (Atorvastatin calcium) .Marland Kitchen.. 1 by mouth at bedtime 12)  Advil 200 Mg Tabs (Ibuprofen) .... 2 by mouth two times a day 13)  B6  14)  Citalopram Hydrobromide 20 Mg Tabs (Citalopram hydrobromide) .... 2 by mouth once daily  Patient Instructions: 1)  Please schedule a follow-up appointment in 4 months .  2)  talk to Mrs Noe Gens, counselor at the cancer center Prescriptions: HYDROCODONE-ACETAMINOPHEN 10-325 MG  TABS (HYDROCODONE-ACETAMINOPHEN) 1 by mouth  every 6 hours  #120 x 1   Entered by:   Shary Decamp   Authorized by:   Nolon Rod. Paz MD   Signed by:   Shary Decamp on 02/18/2009   Method used:   Printed then faxed to ...       [facility]  [address] [facility]* (retail)       [address]       [place], [place]  [address]       Ph: [phone] or [phone]       Fax: [phone]   RxID:   [id number] CLONAZEPAM 0.5 MG TABS (CLONAZEPAM) 3 by mouth at bedtime fax [phone]  #90 x 1   Entered by:   Shary Decamp   Authorized by:   Nolon Rod. Paz MD   Signed by:   Shary Decamp on 02/18/2009   Method used:   Printed then faxed to ...       [facility]  [address] [facility]* (retail)       [address]       [place], [place]  [address]       Ph: [phone] or [phone]       Fax: [phone]   RxID:   [id number] CITALOPRAM HYDROBROMIDE 20 MG TABS (CITALOPRAM HYDROBROMIDE) 2 by mouth once daily  #180 x 1   Entered by:   Shary Decamp   Authorized by:   Nolon Rod. Paz MD   Signed by:   Shary Decamp on 02/18/2009   Method used:   Faxed to ...       [facility]  [address] [facility]* (retail)       [address]       [place], [place]  [address]        Ph: [phone] or [phone]       Fax: [phone]   RxID:   [id number] LIPITOR 40 MG TABS (ATORVASTATIN CALCIUM) 1 by mouth at bedtime  #90 x 1   Entered by:  Shary Decamp   Authorized by:   Nolon Rod. Paz MD   Signed by:   Shary Decamp on 02/18/2009   Method used:   Faxed to ...       [facility]  [address] [facility]* (retail)       [address]       [place], [place]  [address]       Ph: [phone] or [phone]       Fax: [phone]   RxID:   [id number] ADVAIR DISKUS 100-50 MCG/DOSE  MISC (FLUTICASONE-SALMETEROL) two times a day  #1 x 3   Entered by:   Shary Decamp   Authorized by:   Nolon Rod. Paz MD   Signed by:   Shary Decamp on 02/18/2009   Method used:   Faxed to ...       [facility]  [address] [facility]* (retail)       [address]       [place], [place]  [address]       Ph: [phone] or [phone]       Fax: [phone]   RxID:   [id number] VENTOLIN HFA 108 (90 BASE) MCG/ACT  AERS (ALBUTEROL SULFATE) 2 puffs qid prn  #18 Gram x 3   Entered by:   Shary Decamp   Authorized by:   Nolon Rod. Paz MD   Signed by:   Shary Decamp on 02/18/2009   Method used:   Faxed to ...       [facility]  [address] [facility]* (retail)       [address]       [place], [place]  [address]       Ph: [phone] or [phone]       Fax: [phone]   RxID:   [id number] NASONEX 50 MCG/ACT  SUSP (MOMETASONE FUROATE) 2 sprays each nostril once daily  #1 x 3   Entered by:   Shary Decamp   Authorized by:   Nolon Rod. Paz MD   Signed by:   Shary Decamp on 02/18/2009   Method used:   Faxed to ...       [facility]  [address] [facility]* (retail)       [address]       [place], [place]  [address]       Ph: [phone] or [phone]       Fax: [phone]   RxID:   [id number] FUROSEMIDE 20 MG  TABS (FUROSEMIDE) 1 by mouth once a day  #90 x 1   Entered by:   Shary Decamp   Authorized by:    Nolon Rod. Paz MD   Signed by:   Shary Decamp on 02/18/2009   Method used:   Faxed to ...       [facility]  [address] [facility]* (retail)       [address]       [place], [place]  [address]       Ph: [phone] or [phone]       Fax: [phone]   RxID:   [id number] BENAZEPRIL HCL 10 MG TABS (BENAZEPRIL HCL) 1 by mouth once daily  #90 x 1   Entered by:   Shary Decamp   Authorized by:  Jose E. Paz MD   Signed by:   Shary Decamp on 02/18/2009   Method used:   Faxed to ...       [facility]  [address] [facility]* (retail)       [address]       [place], [place]  [address]       Ph: [phone] or [phone]       Fax: [phone]   RxID:   [id number]

## 2010-03-14 NOTE — Progress Notes (Signed)
Summary: pulse oximeter, continued to qualify for oxygen  Phone Note Outgoing Call   Summary of Call: Pulse oximetry results reviewed, she is under 88% for 18.4% of the time She again  qualifies for oxygen. please check with the oxygen provider and see if they need another prescription or order [facility] E. Addaline Peplinski MD  August 05, 2009 1:43 PM   Follow-up for Phone Call        Left message with Eber Jones with [facility] [phone] that pt. again qualifies for O2. Was previously on 2L/min.  She states that Jillyn Hidden is out of the office but will give him the message on Monday and will call us if they need anything else.  Mervin Kung CMA  August 05, 2009 1:58 PM

## 2010-03-16 NOTE — Progress Notes (Signed)
Summary: clonazepam refill  Phone Note Refill Request Message from:  Fax from Pharmacy on February 08, 2010 4:48 PM  Refills Requested: Medication #1:  CLONAZEPAM 0.5 MG   Notes: Take 3 tablets by mouth at bedtime [facility], [address], [place], [place]    phone = [phone],  fax  =  [phone]    qty = 90 NOTE---notes show another medication was substituted for this medicatioon  Initial call taken by: Jerolyn Shin,  February 08, 2010 5:13 PM  Follow-up for Phone Call        I spoke w/ pt she states that she is no longer taking- on automatic refill from pharmacy. Army Fossa CMA  February 09, 2010 8:16 AM

## 2010-03-16 NOTE — Letter (Signed)
Summary: no evidence of anal CA recurrence ---Surgery  [facility]   Imported By: Lanelle Bal 02/24/2010 11:30:45  _____________________________________________________________________  External Attachment:    Type:   Image     Comment:   External Document

## 2010-03-16 NOTE — Progress Notes (Signed)
Summary: Refill Request  Phone Note Refill Request Call back at [phone] Message from:  Pharmacy on February 16, 2010 4:14 PM  Refills Requested: Medication #1:  HYDROCODONE-ACETAMINOPHEN 10-325 MG  TABS 1 by mouth  every 6 hours fax [phone]   Dosage confirmed as above?Dosage Confirmed   Supply Requested: 120   Last Refilled: 01/12/2010 [facility]  Next Appointment Scheduled: 1.11.12 Initial call taken by: Harold Barban,  February 16, 2010 4:14 PM  Follow-up for Phone Call        last refilled 12/19/09 120 x 1. Army Fossa CMA  February 16, 2010 4:28 PM   Additional Follow-up for Phone Call Additional follow up Details #1::        120, 1 RF Additional Follow-up by: [facility] E. Verle Brillhart MD,  February 16, 2010 6:31 PM    Prescriptions: HYDROCODONE-ACETAMINOPHEN 10-325 MG  TABS (HYDROCODONE-ACETAMINOPHEN) 1 by mouth  every 6 hours fax [phone]  #120 x 1   Entered by:   Army Fossa CMA   Authorized by:   Nolon Rod. Samarra Ridgely MD   Signed by:   Army Fossa CMA on 02/17/2010   Method used:   Printed then faxed to ...       [facility]* (retail)       [address]       [address]       [place], [place]  [address]       Ph: [phone]       Fax: [phone]   RxID:   [id number]

## 2010-03-16 NOTE — Progress Notes (Signed)
Summary: citalopram refill  Phone Note Refill Request Message from:  Fax from Pharmacy on March 03, 2010 12:14 PM  Refills Requested: Medication #1:  CITALOPRAM HYDROBROMIDE 20 MG TABS 1.5 tablets daily [facility], [address], [address], [place], [place]  [address]   phone - [phone]    fax - [phone]  qty -[phone]  Next Appointment Scheduled: mon 2/13   Paz Initial call taken by: Jerolyn Shin,  March 03, 2010 12:30 PM    Prescriptions: CITALOPRAM HYDROBROMIDE 20 MG TABS (CITALOPRAM HYDROBROMIDE) 1.5 tablets daily  #45 x 2   Entered by:   Army Fossa CMA   Authorized by:   Nolon Rod. Paz MD   Signed by:   Army Fossa CMA on 03/03/2010   Method used:   Electronically to        [facility]* (retail)       [address]       [address]       [place], [place]  [address]       Ph: [phone]       Fax: [phone]   RxID:   [id number]

## 2010-03-16 NOTE — Progress Notes (Signed)
Summary: Refill Requests   Phone Note Refill Request Call back at [phone] Message from:  Pharmacy on January 20, 2010 2:20 PM  Refills Requested: Medication #1:  CHLORDIAZEPOXIDE HCL 5 MG CAPS what by mouth 3 times a day as needed   Dosage confirmed as above?Dosage Confirmed   Supply Requested: 3 months   Last Refilled: 12/19/2009  Medication #2:  clonazepam 0.5mg , take 3 tablets by mouth at bedtime   Dosage confirmed as above?Dosage Confirmed   Supply Requested: 1 month [name]  Next Appointment Scheduled: 1.11.12 Initial call taken by: Harold Barban,  January 20, 2010 2:21 PM  Follow-up for Phone Call        I left a message for Jean Mitchell to call back to clarify meds. We switched pt from Clonazepam to the Chlordiazeporixide. Pt should not be taking both meds correct?  Follow-up by: Army Fossa CMA,  January 20, 2010 2:35 PM  Additional Follow-up for Phone Call Additional follow up Details #1::        correct, stay on Chlordiazeporixide only Additional Follow-up by: [facility] E. Paz MD,  January 23, 2010 11:28 AM    Additional Follow-up for Phone Call Additional follow up Details #2::    left message for pt to call back. Army Fossa CMA  January 23, 2010 11:39 AM   how many refills on medication ? Army Fossa CMA  January 23, 2010 11:39 AM  90 and 2 RF Jean E. Paz MD  January 23, 2010 1:01 PM   Additional Follow-up for Phone Call Additional follow up Details #3:: Details for Additional Follow-up Action Taken: Left message for pt to call back. Army Fossa CMA  January 24, 2010 10:26 AM  I spoke w/ pt she states that she has been taking both together. I instructed pt from here on the Clonazepam would not be prescribed and she should not take both meds together. Army Fossa CMA  January 24, 2010 10:50 AM  agree  Jean Rod. Paz MD  January 24, 2010 11:54 AM   Prescriptions: CHLORDIAZEPOXIDE HCL 5 MG CAPS (CHLORDIAZEPOXIDE HCL)  what by mouth 3 times a day as needed  #90 x 2   Entered by:   Army Fossa CMA   Authorized by:   Jean Rod. Paz MD   Signed by:   Army Fossa CMA on 01/23/2010   Method used:   Printed then faxed to ...       [facility]* (retail)       [address]       [address]       [place], [place]  [address]       Ph: [phone]       Fax: [phone]   RxID:   [id number]

## 2010-03-16 NOTE — Assessment & Plan Note (Signed)
Summary: 2 month followup///sph   Vital Signs:  Patient profile:   77 year old female Weight:      112.25 pounds O2 Sat:      92 % on Room air Pulse rate:   76 / minute Pulse rhythm:   regular BP sitting:   136 / 68  (left arm) Cuff size:   regular  Vitals Entered By: Army Fossa CMA (February 22, 2010 8:13 AM)  O2 Flow:  Room air CC: 3 month f/u- fasting  Comments discuss sleeping- unable to stay asleep proair not working [facility]    History of Present Illness: ROV complained of difficulty sleeping on 12/2009, we discontinued clonazepam and she was prescribed CHLORDIAZEPOXIDE because her daytime anxiety was not well controlled. With the switch, her daytime anxiety is well controlled but she's not been sleeping well without clonazepam. She was supposed to take citalopram 1.5 tablets daily but she's only taking one view  COPD  -- states that her symptoms are not well controlled She gets short of breath and  proair is not helping much She stopped spirive----$$$  Review of systems No fever Mild URI symptoms for 4 days Good compliance with nocturnal oxygen   Current Medications (verified): 1)  Hydrocodone-Acetaminophen 10-325 Mg  Tabs (Hydrocodone-Acetaminophen) .Marland Kitchen.. 1 By Mouth  Every 6 Hours Fax [phone] 2)  Furosemide 20 Mg  Tabs (Furosemide) .Marland Kitchen.. 1 By Mouth Once A Day 3)  Chlordiazepoxide Hcl 5 Mg Caps (Chlordiazepoxide Hcl) .... What By Mouth 3 Times A Day As Needed 4)  Flonase 50 Mcg/act Susp (Fluticasone Propionate) .... 2 Sprays On Each Side of The Nose Daily. 5)  Proair Hfa 108 (90 Base) Mcg/act Aers (Albuterol Sulfate) .... 2 Puffs Qid 6)  Advair Diskus 100-50 Mcg/dose  Misc (Fluticasone-Salmeterol) .... Two Times A Day 7)  Albuterol Neb .... Qid 8)  Spiriva Handihaler 18 Mcg  Caps (Tiotropium Bromide Monohydrate) .... One Puff Qd 9)  Otc  Vitamin D and B12 Every Day 10)  Pravachol 40 Mg Tabs (Pravastatin Sodium) .... One By Mouth Daily 11)   Advil 200 Mg Tabs (Ibuprofen) .... 2 By Mouth Two Times A Day 12)  B6 13)  Citalopram Hydrobromide 20 Mg Tabs (Citalopram Hydrobromide) .... 1.5 Tablets Daily 14)  Continous Oxygen .... 2 L Per Min.  Allergies (verified): 1)  ! Neurontin (Gabapentin) 2)  * Vytorin  Past History:  Past Medical History: R hip Fx 08-2009, s/p ORIF Breast Cancer, Dx 2007, s/p XRT, on Tamoxifen SCC of the anus s/p excision 11-10, wider excision 03-16-09 HYPERLIPIDEMIA  CHF  COPD  ------------- O2 at night  DEPRESSION  BACK PAIN, LUMBAR, CHRONIC  AAA  OSTEOPOROSIS ----> DEXA 02-18-08 : osteoporosis, s/p reclast 02-18-08 and 09-2009 HTN    INSOMNIA h/o anemia, Cscope 2007, saw GI 5-09, had a (-) EGD ESOPHAGEAL STRICTURE   Past Surgical History: Oophorectomy unilateral ARTHROSCOPY, RIGHT KNEE, HX OF (ICD-V45.89) * LEFT PARTIAL MASTECTOMY HYSTERECTOMY  HEMORRHOIDECTOMY, HX OF    Social History: Reviewed history from 11/21/2009 and no changes required. divorce lives by self not driving since hip Fx 05-7827 ADL, son brings food, limited ADL since 7-11  2 children retired - Psychologist, sport and exercise quit tobacco-- aprox 2006  Physical Exam  General:  alert and well-developed.   Lungs:  normal respiratory effort, no intercostal retractions, and no accessory muscle use.  decreased breath sounds but otherwise clear Heart:  normal rate, regular rhythm, and no murmur.   Extremities:  no lower extremity  edema   Impression & Recommendations:  Problem # 1:  DEPRESSION (ICD-311) on 12/2009, we discontinued clonazepam and she was prescribed CHLORDIAZEPOXIDE because her daytime anxiety was not well controlled. With the switch, her daytime anxiety is well controlled but she's not been sleeping well without clonazepam. She was supposed to take citalopram 1.5 tablets daily but she's only taking one a day  today the patient complains of difficulty sleeping and likes her clonazepam added to her meds  I explained to the  patient that is not appropriate to prescribe multiple medications that could make her confused or sleepy.  plan: Increase CHLORDIAZEPOXIDE from three times a day to  1-1-2 Take citalopram 1.5 tablets as prescribed On looking back, she has never been really satisfied with her  depression treatment , options would be trying Remeron vs. psychiatric referral if not better with increased citalopram  (today she states she will not go to a psych again "I saw them x long time after my husband left")  Her updated medication list for thi  problem includes:    Citalopram Hydrobromide 20 Mg Tabs (Citalopram hydrobromide) .Marland Kitchen... 1.5 tablets daily    Chlordiazepoxide Hcl 5 Mg Caps (Chlordiazepoxide hcl) ..... One by mouth twice a day, 2 by mouth at bedtime  Problem # 2:  INSOMNIA (ICD-780.52) see #1  Problem # 3:  COPD (ICD-496) subjectively, she does not feel well, feels short of breath On exam, there is no evidence of volume overload   Lungs are clear She cannot afford spiriva Last PFTs 11-2008 Plan: Continue nocturnal oxygen Consider daytime oxygen Reviewed with the patient technique of Advair use hoping to increase medication delivery Consider pulmonary referral The following medications were removed from the medication list:    Spiriva Handihaler 18 Mcg Caps (Tiotropium bromide monohydrate) ..... One puff qd Her updated medication list for this problem includes:    Proair Hfa 108 (90 Base) Mcg/act Aers (Albuterol sulfate) .Marland Kitchen... 2 puffs qid    Advair Diskus 100-50 Mcg/dose Misc (Fluticasone-salmeterol) .Marland Kitchen..Marland Kitchen Two times a day  Problem # 4:  F2F > 25 min  due to chart review, advair technique and counseling   Complete Medication List: 1)  Pravachol 40 Mg Tabs (Pravastatin sodium) .... One by mouth daily 2)  Furosemide 20 Mg Tabs (Furosemide) .Marland Kitchen.. 1 by mouth once a day 3)  Citalopram Hydrobromide 20 Mg Tabs (Citalopram hydrobromide) .... 1.5 tablets daily 4)  Chlordiazepoxide Hcl 5 Mg Caps  (Chlordiazepoxide hcl) .... One by mouth twice a day, 2 by mouth at bedtime 5)  Flonase 50 Mcg/act Susp (Fluticasone propionate) .... 2 sprays on each side of the nose daily. 6)  Proair Hfa 108 (90 Base) Mcg/act Aers (Albuterol sulfate) .... 2 puffs qid 7)  Advair Diskus 100-50 Mcg/dose Misc (Fluticasone-salmeterol) .... Two times a day 8)  Albuterol Neb  .... Qid 9)  Continous Oxygen  .... 2 l per min. 10)  Hydrocodone-acetaminophen 10-325 Mg Tabs (Hydrocodone-acetaminophen) .Marland Kitchen.. 1 by mouth  every 6 hours fax [phone] 11)  Otc Vitamin D and B12 Every Day  12)  Advil 200 Mg Tabs (Ibuprofen) .... 2 by mouth two times a day 13)  B6   Patient Instructions: 1)  please read carefully your medication list 2)  Please schedule a follow-up appointment in 1 month.    Orders Added: 1)  Est. Patient Level IV [[address]]

## 2010-03-27 ENCOUNTER — Ambulatory Visit: Payer: Self-pay | Admitting: Internal Medicine

## 2010-04-14 ENCOUNTER — Telehealth (INDEPENDENT_AMBULATORY_CARE_PROVIDER_SITE_OTHER): Payer: Self-pay | Admitting: *Deleted

## 2010-04-20 NOTE — Progress Notes (Signed)
Summary: refill  Phone Note Refill Request Message from:  Fax from Pharmacy on April 14, 2010 2:06 PM  Refills Requested: Medication #1:  PRAVACHOL 40 MG TABS one by mouth daily [facility] fax [phone]  Initial call taken by: Okey Regal [place],  April 14, 2010 2:07 PM    New/Updated Medications: PRAVACHOL 40 MG TABS (PRAVASTATIN SODIUM) one by mouth daily. DUE FOR OFFICE VISIT BEFORE ADDITIONAL REFILLS. Prescriptions: PRAVACHOL 40 MG TABS (PRAVASTATIN SODIUM) one by mouth daily. DUE FOR OFFICE VISIT BEFORE ADDITIONAL REFILLS.  #30 x 0   Entered by:   Army Fossa CMA   Authorized by:   Nolon Rod. Paz MD   Signed by:   Army Fossa CMA on 04/14/2010   Method used:   Electronically to        [facility]* (retail)       [address]       [address]       [place], [place]  [address]       Ph: [phone]       Fax: [phone]   RxID:   [id number]

## 2010-04-21 ENCOUNTER — Telehealth (INDEPENDENT_AMBULATORY_CARE_PROVIDER_SITE_OTHER): Payer: Self-pay | Admitting: *Deleted

## 2010-04-25 NOTE — Progress Notes (Signed)
Summary: pravastatin--went to wrong pharmacy  Phone Note Refill Request Message from:  Fax from Pharmacy on April 21, 2010 4:52 PM  Refills Requested: Medication #1:  PRAVACHOL 40 MG TABS one by mouth daily. DUE FOR OFFICE VISIT BEFORE ADDITIONAL REFILLS. phone note dated 3/2 went to wrong pharmacy---needs to be [facility], [address], [place], [place]  [address]   phone - [phone]    fax - [phone]  qty -  Initial call taken by: Jerolyn Shin,  April 21, 2010 4:53 PM    Prescriptions: PRAVACHOL 40 MG TABS (PRAVASTATIN SODIUM) one by mouth daily. DUE FOR OFFICE VISIT BEFORE ADDITIONAL REFILLS.  #30 x 0   Entered by:   Army Fossa CMA   Authorized by:   Nolon Rod. Paz MD   Signed by:   Army Fossa CMA on 04/21/2010   Method used:   Faxed to ...       [facility] (retail)       [address]       [address]       [place], [place]  [address]  [place]       Ph: [phone]       Fax: [phone]   RxID:   [id number]

## 2010-04-26 ENCOUNTER — Ambulatory Visit (INDEPENDENT_AMBULATORY_CARE_PROVIDER_SITE_OTHER): Payer: Medicare Other | Admitting: Internal Medicine

## 2010-04-26 ENCOUNTER — Encounter: Payer: Self-pay | Admitting: Internal Medicine

## 2010-04-26 DIAGNOSIS — J449 Chronic obstructive pulmonary disease, unspecified: Secondary | ICD-10-CM

## 2010-04-26 DIAGNOSIS — G4700 Insomnia, unspecified: Secondary | ICD-10-CM

## 2010-04-27 ENCOUNTER — Telehealth: Payer: Self-pay | Admitting: Internal Medicine

## 2010-04-28 ENCOUNTER — Telehealth: Payer: Self-pay | Admitting: Internal Medicine

## 2010-04-29 LAB — BASIC METABOLIC PANEL
BUN: 6 mg/dL (ref 6–23)
CO2: 27 mEq/L (ref 19–32)
CO2: 30 mEq/L (ref 19–32)
CO2: 31 mEq/L (ref 19–32)
Calcium: 8.1 mg/dL — ABNORMAL LOW (ref 8.4–10.5)
Calcium: 8.3 mg/dL — ABNORMAL LOW (ref 8.4–10.5)
Calcium: 8.3 mg/dL — ABNORMAL LOW (ref 8.4–10.5)
Calcium: 8.4 mg/dL (ref 8.4–10.5)
Chloride: 106 mEq/L (ref 96–112)
Creatinine, Ser: 0.5 mg/dL (ref 0.4–1.2)
Creatinine, Ser: 0.59 mg/dL (ref 0.4–1.2)
GFR calc Af Amer: >60 mL/min (ref >60)
GFR calc Af Amer: >60 mL/min (ref >60)
GFR calc non Af Amer: >60 mL/min (ref >60)
Glucose, Bld: 114 mg/dL — ABNORMAL HIGH (ref 70–99)
Glucose, Bld: 137 mg/dL — ABNORMAL HIGH (ref 70–99)
Glucose, Bld: 86 mg/dL (ref 70–99)
Sodium: 137 mEq/L (ref 135–145)
Sodium: 138 mEq/L (ref 135–145)
Sodium: 139 mEq/L (ref 135–145)

## 2010-04-29 LAB — RETICULOCYTES
RBC.: 2.76 MIL/uL — ABNORMAL LOW (ref 3.87–5.11)
Retic Count, Absolute: 22.1 10*3/uL (ref 19.0–186.0)

## 2010-04-29 LAB — URINALYSIS, MICROSCOPIC ONLY
Bilirubin Urine: NEGATIVE
Nitrite: NEGATIVE
Protein, ur: NEGATIVE mg/dL
Specific Gravity, Urine: 1.015 (ref 1.005–1.030)
Urobilinogen, UA: 0.2 mg/dL (ref 0.0–1.0)

## 2010-04-29 LAB — CBC
HCT: 25.5 % — ABNORMAL LOW (ref 36.0–46.0)
HCT: 26 % — ABNORMAL LOW (ref 36.0–46.0)
Hemoglobin: 8.5 g/dL — ABNORMAL LOW (ref 12.0–15.0)
Hemoglobin: 9.5 g/dL — ABNORMAL LOW (ref 12.0–15.0)
Hemoglobin: 9.7 g/dL — ABNORMAL LOW (ref 12.0–15.0)
MCH: 32.5 pg (ref 26.0–34.0)
MCH: 33.2 pg (ref 26.0–34.0)
MCH: 33.4 pg (ref 26.0–34.0)
MCHC: 33.3 g/dL (ref 30.0–36.0)
MCHC: 33.4 g/dL (ref 30.0–36.0)
MCV: 100.1 fL — ABNORMAL HIGH (ref 78.0–100.0)
MCV: 99.5 fL (ref 78.0–100.0)
MCV: 99.7 fL (ref 78.0–100.0)
Platelets: 172 10*3/uL (ref 150–400)
RBC: 2.62 MIL/uL — ABNORMAL LOW (ref 3.87–5.11)
RBC: 2.98 MIL/uL — ABNORMAL LOW (ref 3.87–5.11)

## 2010-04-29 LAB — DIFFERENTIAL
Basophils Relative: 0 % (ref 0–1)
Eosinophils Absolute: 0.2 10*3/uL (ref 0.0–0.7)
Eosinophils Relative: 4 % (ref 0–5)
Lymphs Abs: 0.9 10*3/uL (ref 0.7–4.0)
Monocytes Absolute: 0.8 10*3/uL (ref 0.1–1.0)
Monocytes Relative: 13 % — ABNORMAL HIGH (ref 3–12)
Neutrophils Relative %: 67 % (ref 43–77)

## 2010-04-29 LAB — FOLATE: Folate: 11.7 ng/mL

## 2010-04-29 LAB — IRON AND TIBC
Iron: <10 ug/dL — ABNORMAL LOW (ref 42–135)
UIBC: 195 ug/dL

## 2010-04-29 LAB — FERRITIN: Ferritin: 158 ng/mL (ref 10–291)

## 2010-04-29 LAB — CROSSMATCH

## 2010-04-30 LAB — URINALYSIS, ROUTINE W REFLEX MICROSCOPIC
Ketones, ur: NEGATIVE mg/dL
Leukocytes, UA: NEGATIVE
Nitrite: NEGATIVE
Protein, ur: NEGATIVE mg/dL
Urobilinogen, UA: 0.2 mg/dL (ref 0.0–1.0)

## 2010-04-30 LAB — BASIC METABOLIC PANEL
BUN: 12 mg/dL (ref 6–23)
CO2: 29 mEq/L (ref 19–32)
CO2: 29 mEq/L (ref 19–32)
Calcium: 8.9 mg/dL (ref 8.4–10.5)
Calcium: 9.9 mg/dL (ref 8.4–10.5)
Chloride: 104 mEq/L (ref 96–112)
Chloride: 100 mEq/L (ref 96–112)
Chloride: 101 mEq/L (ref 96–112)
Creatinine, Ser: 0.59 mg/dL (ref 0.4–1.2)
Creatinine, Ser: 0.65 mg/dL (ref 0.4–1.2)
Creatinine, Ser: 0.75 mg/dL (ref 0.4–1.2)
GFR calc Af Amer: >60 mL/min (ref >60)
GFR calc Af Amer: >60 mL/min (ref >60)
Glucose, Bld: 94 mg/dL (ref 70–99)
Potassium: 3.9 mEq/L (ref 3.5–5.1)
Sodium: 140 mEq/L (ref 135–145)
Sodium: 139 mEq/L (ref 135–145)

## 2010-04-30 LAB — ABO/RH: ABO/RH(D): O POS

## 2010-04-30 LAB — URINE MICROSCOPIC-ADD ON

## 2010-04-30 LAB — CBC
HCT: 31.3 % — ABNORMAL LOW (ref 36.0–46.0)
Hemoglobin: 10.1 g/dL — ABNORMAL LOW (ref 12.0–15.0)
Hemoglobin: 11.8 g/dL — ABNORMAL LOW (ref 12.0–15.0)
MCH: 32.9 pg (ref 26.0–34.0)
MCH: 33.1 pg (ref 26.0–34.0)
MCHC: 34 g/dL (ref 30.0–36.0)
MCV: 97.5 fL (ref 78.0–100.0)
Platelets: 179 10*3/uL (ref 150–400)
Platelets: 208 10*3/uL (ref 150–400)
RBC: 3.08 MIL/uL — ABNORMAL LOW (ref 3.87–5.11)
RBC: 3.48 MIL/uL — ABNORMAL LOW (ref 3.87–5.11)
RDW: 13.3 % (ref 11.5–15.5)
WBC: 4.9 10*3/uL (ref 4.0–10.5)
WBC: 6.6 10*3/uL (ref 4.0–10.5)
WBC: 6.6 10*3/uL (ref 4.0–10.5)

## 2010-04-30 LAB — DIFFERENTIAL
Basophils Absolute: 0 10*3/uL (ref 0.0–0.1)
Eosinophils Absolute: 0.1 10*3/uL (ref 0.0–0.7)
Eosinophils Relative: 1 % (ref 0–5)
Lymphocytes Relative: 16 % (ref 12–46)
Monocytes Absolute: 0.8 10*3/uL (ref 0.1–1.0)

## 2010-04-30 LAB — TSH: TSH: 0.92 u[IU]/mL (ref 0.350–4.500)

## 2010-04-30 LAB — TYPE AND SCREEN: ABO/RH(D): O POS

## 2010-04-30 LAB — TROPONIN I: Troponin I: 0.01 ng/mL (ref 0.00–0.06)

## 2010-05-02 NOTE — Progress Notes (Signed)
Summary: Chlordiazepoxide refill  Phone Note Refill Request Message from:  Fax from Pharmacy on April 28, 2010 10:16 AM  Refills Requested: Medication #1:  CHLORDIAZEPOXIDE HCL 5 MG CAPS one by mouth twice a day [facility], [address], [address], [place], [place]  [address]   phone - [phone]    fax - [phone]  qty -24     ****NOTE****   directions on fax state:   "Take 1 capsule three times a day as needed"  Next Appointment Scheduled: Fri 6/15   Ivyrose Hashman Initial call taken by: Jerolyn Shin,  April 28, 2010 10:24 AM  Follow-up for Phone Call        clarify directions? Follow-up by: Army Fossa CMA,  April 28, 2010 10:28 AM  Additional Follow-up for Phone Call Additional follow up Details #1::         Chlordiazepoxide Hcl 5 Mg Caps-- 1 in the morning, 1 in the afternoon and 2 at bedtime okay to call a month supply and 3 refills Additional Follow-up by: Peightyn Roberson E. Saphyre Cillo MD,  April 28, 2010 4:49 PM    New/Updated Medications: CHLORDIAZEPOXIDE HCL 5 MG CAPS (CHLORDIAZEPOXIDE HCL) 1 in the morning, 1 in the afternoon and 2 at bedtime. Prescriptions: CHLORDIAZEPOXIDE HCL 5 MG CAPS (CHLORDIAZEPOXIDE HCL) 1 in the morning, 1 in the afternoon and 2 at bedtime.  #360 x 3   Entered by:   Army Fossa CMA   Authorized by:   Nolon Rod. Dequane Strahan MD   Signed by:   Army Fossa CMA on 04/28/2010   Method used:   Printed then faxed to ...       [facility] (retail)       [address]       [address]       [place], [place]  [address]  [place]       Ph: [phone]       Fax: [phone]   RxID:   [id number]

## 2010-05-02 NOTE — Assessment & Plan Note (Signed)
Summary: rto follow up/cbs   Vital Signs:  Patient profile:   77 year old female Height:      65 inches Weight:      108.50 pounds BMI:     18.12 Pulse rate:   78 / minute Pulse rhythm:   regular BP sitting:   118 / 70  (left arm) Cuff size:   regular  Vitals Entered By: Army Fossa CMA (April 26, 2010 10:56 AM) CC: follow-up visit- not fasting  Comments [facility] refill hydrocodone and citalopram   History of Present Illness:  followup from previous visit Doing about the same  taking chlordiazepoxide one tablet twice a day, was prescribed a higher  dose than that Still complaining of difficulty sleeping.  Patient is upset, she told me I hurt her feelings because I liked to send her to a psychiatrist and I told her she was a "pill-lover"  @ the last visit.  (I  don't recall ever using that term on any of my pts )  Current Medications (verified): 1)  Pravachol 40 Mg Tabs (Pravastatin Sodium) .... One By Mouth Daily. Due For Office Visit Before Additional Refills. 2)  Citalopram Hydrobromide 20 Mg Tabs (Citalopram Hydrobromide) .... 1.5 Tablets Daily 3)  Chlordiazepoxide Hcl 5 Mg Caps (Chlordiazepoxide Hcl) .... One By Mouth Twice A Day, 2 By Mouth At Bedtime 4)  Flonase 50 Mcg/act Susp (Fluticasone Propionate) .... 2 Sprays On Each Side of The Nose Daily. 5)  Advair Diskus 100-50 Mcg/dose  Misc (Fluticasone-Salmeterol) .... Two Times A Day 6)  Albuterol Neb .... Qid 7)  Continous Oxygen .... 2 L Per Min. 8)  Hydrocodone-Acetaminophen 10-325 Mg  Tabs (Hydrocodone-Acetaminophen) .Marland Kitchen.. 1 By Mouth  Every 6 Hours Fax [phone] 9)  Otc  Vitamin D and B12 Every Day 10)  Advil 200 Mg Tabs (Ibuprofen) .... 2 By Mouth Two Times A Day 11)  B6  Allergies (verified): 1)  ! Neurontin (Gabapentin) 2)  * Vytorin  Past History:  Past Medical History: Reviewed history from 02/22/2010 and no changes required. R hip Fx 08-2009, s/p ORIF Breast Cancer, Dx 2007, s/p XRT, on  Tamoxifen SCC of the anus s/p excision 11-10, wider excision 03-16-09 HYPERLIPIDEMIA  CHF  COPD  ------------- O2 at night  DEPRESSION  BACK PAIN, LUMBAR, CHRONIC  AAA  OSTEOPOROSIS ----> DEXA 02-18-08 : osteoporosis, s/p reclast 02-18-08 and 09-2009 HTN    INSOMNIA h/o anemia, Cscope 2007, saw GI 5-09, had a (-) EGD ESOPHAGEAL STRICTURE   Past Surgical History: Reviewed history from 02/22/2010 and no changes required. Oophorectomy unilateral ARTHROSCOPY, RIGHT KNEE, HX OF (ICD-V45.89) * LEFT PARTIAL MASTECTOMY HYSTERECTOMY  HEMORRHOIDECTOMY, HX OF    Social History: Reviewed history from 11/21/2009 and no changes required. 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   Lungs:  normal respiratory effort, no intercostal retractions, and no accessory muscle use.  decreased breath sounds but otherwise clear Heart:  normal rate, regular rhythm, and no murmur.   Extremities:  no lower extremity edema   Impression & Recommendations:  Problem # 1:  CHF (ICD-428.0)  was taking Lasix daily, discontinue it  because cramps. I recommend her to take it at least 3 times a week, see new prescription.  so far no evidence of vol overload Her updated medication list for this problem includes:    Furosemide 20 Mg Tabs (Furosemide) .Marland Kitchen... 1 by mouth monday, wednesday and friday  Problem # 2:  COPD (ICD-496)  unable to afford Advair  samples provided I will call her respiratory therapist and try to switch her from Advair to a nebulization. Her updated medication list for this problem includes:    Proair Hfa 108 (90 Base) Mcg/act Aers (Albuterol sulfate) .Marland Kitchen... 2 puffs qid     Advair Diskus 100-50 Mcg/dose Misc (Fluticasone-salmeterol) .Marland Kitchen..Marland Kitchen Two times a day  Problem # 3:  DEPRESSION (ICD-311) see HPI  I hurt  the patient feelings in the last office visit, I apologize for give her the wrong impression, I certainly enjoy to see her any time.  I again explained to her why I don't like to  add more meds  that may cause drowsiness and consequently falls  she is currently taking  Chlordiazepoxide  one twice a day, I encouraged her to take 1-1-2 ( hopefully that will help her to sleep)  Her updated medication list for this problem includes:    Citalopram Hydrobromide 20 Mg Tabs (Citalopram hydrobromide) .Marland Kitchen... 1.5 tablets daily    Chlordiazepoxide Hcl 5 Mg Caps (Chlordiazepoxide hcl) ..... One by mouth twice a day, 2 by mouth at bedtime  Complete Medication List: 1)  Pravachol 40 Mg Tabs (Pravastatin sodium) .... One by mouth daily. due for office visit before additional refills. 2)  Furosemide 20 Mg Tabs (Furosemide) .Marland Kitchen.. 1 by mouth monday, wednesday and friday 3)  Citalopram Hydrobromide 20 Mg Tabs (Citalopram hydrobromide) .... 1.5 tablets daily 4)  Chlordiazepoxide Hcl 5 Mg Caps (Chlordiazepoxide hcl) .... One by mouth twice a day, 2 by mouth at bedtime 5)  Flonase 50 Mcg/act Susp (Fluticasone propionate) .... 2 sprays on each side of the nose daily. 6)  Proair Hfa 108 (90 Base) Mcg/act Aers (Albuterol sulfate) .... 2 puffs qid 7)  Advair Diskus 100-50 Mcg/dose Misc (Fluticasone-salmeterol) .... Two times a day 8)  Albuterol Neb  .... Qid 9)  Continous Oxygen  .... 2 l per min. 10)  Hydrocodone-acetaminophen 10-325 Mg Tabs (Hydrocodone-acetaminophen) .Marland Kitchen.. 1 by mouth  every 6 hours fax [phone] 11)  Otc Vitamin D and B12 Every Day  12)  Advil 200 Mg Tabs (Ibuprofen) .... 2 by mouth two times a day 13)  B6   Patient Instructions: 1)  will try to get a replacement for advair 2)   Chlordiazepoxide Hcl 5 Mg Caps 1 in AM, 1 in the afternoon,  2  at bedtime 3)  Please  schedule a follow-up appointment in 3 months .  Prescriptions: HYDROCODONE-ACETAMINOPHEN 10-325 MG  TABS (HYDROCODONE-ACETAMINOPHEN) 1 by mouth  every 6 hours fax [phone]  #120 x 0   Entered and Authorized by:   Nolon Rod. Quamaine Webb MD   Signed by:   Nolon Rod. Kniyah Khun MD on 04/26/2010   Method used:   Print then Give to Patient   RxID:   [id number] PROAIR HFA 108 (90 BASE) MCG/ACT AERS (ALBUTEROL SULFATE) 2 puffs qid  #1 x 6   Entered and Authorized by:   Nolon Rod. Kourtney Terriquez MD   Signed by:   Nolon Rod. Ayani Ospina MD on 04/26/2010   Method used:   Print then Give to Patient   RxID:   [id number]    Orders  Added: 1)  Est. Patient Level III [[address]]

## 2010-05-03 LAB — CBC
HCT: 28.9 % — ABNORMAL LOW (ref 36.0–46.0)
MCV: 100.1 fL — ABNORMAL HIGH (ref 78.0–100.0)
RBC: 2.88 MIL/uL — ABNORMAL LOW (ref 3.87–5.11)
WBC: 6.9 10*3/uL (ref 4.0–10.5)

## 2010-05-11 NOTE — Progress Notes (Signed)
Summary: Advair  ---- Converted from flag ---- ---- 04/27/2010 4:26 PM, Jeremy Mclamb E. Sumedh Shinsato MD wrote: please call her respiratory supply company ([facility]?); we need to substitute advair for a  nebulization b/c advair is expensive, if she gets it through them is cheaper; i'll talk to them if needed ------------------------------  Per [facility] they do not supply the medicine- she will still have to to go through her Pharmacy.Army Fossa CMA  April 27, 2010 4:29 PM   ok, tell patient will provide samples , just call few days before she runs out [place] E. Cassiel Fernandez MD  May 01, 2010 12:47 PM   I spoke w/ pt she is aware. Army Fossa CMA  May 01, 2010 1:09 PM Also needs refill on Citalopram. Army Fossa CMA  May 01, 2010 1:10 PM   Prescriptions: CITALOPRAM HYDROBROMIDE 20 MG TABS (CITALOPRAM HYDROBROMIDE) 1.5 tablets daily  #45 x 2   Entered by:   Army Fossa CMA   Authorized by:   Nolon Rod. Laria Grimmett MD   Signed by:   Army Fossa CMA on 05/01/2010   Method used:   Faxed to ...       [facility] (retail)       [address]       [address]       [place], [place]  [address]  [place]       Ph: [phone]       Fax: [phone]   RxID:   [id number]

## 2010-05-18 LAB — BASIC METABOLIC PANEL
BUN: 6 mg/dL (ref 6–23)
CO2: 31 mEq/L (ref 19–32)
Chloride: 104 mEq/L (ref 96–112)
Glucose, Bld: 120 mg/dL — ABNORMAL HIGH (ref 70–99)
Potassium: 4.3 mEq/L (ref 3.5–5.1)

## 2010-05-23 ENCOUNTER — Other Ambulatory Visit: Payer: Self-pay | Admitting: *Deleted

## 2010-05-23 MED ORDER — HYDROCODONE-ACETAMINOPHEN 10-325 MG PO TABS: 1.0000 | ORAL_TABLET | Freq: Four times a day (QID) | ORAL | Status: AC | PRN

## 2010-05-23 NOTE — Telephone Encounter (Signed)
Last ov and filled 04/26/10 #120, no refills.

## 2010-06-02 ENCOUNTER — Other Ambulatory Visit: Payer: Self-pay | Admitting: *Deleted

## 2010-06-02 MED ORDER — ALBUTEROL SULFATE (2.5 MG/3ML) 0.083% IN NEBU: 2.5000 mg | INHALATION_SOLUTION | Freq: Four times a day (QID) | RESPIRATORY_TRACT | Status: DC

## 2010-06-02 NOTE — Telephone Encounter (Signed)
Ok 3 months supply

## 2010-06-12 ENCOUNTER — Encounter: Payer: Self-pay | Admitting: Cardiology

## 2010-06-13 ENCOUNTER — Encounter: Payer: Medicare Other | Admitting: Cardiology

## 2010-06-26 ENCOUNTER — Other Ambulatory Visit: Payer: Self-pay | Admitting: *Deleted

## 2010-06-26 MED ORDER — PRAVASTATIN SODIUM 40 MG PO TABS: 40.0000 mg | ORAL_TABLET | Freq: Every day | ORAL | Status: DC

## 2010-06-27 NOTE — Procedures (Signed)
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   Informed consent was obtained after describing the risks and benefits of  the procedure with the patient.  These include bleeding, bruising,  infection.  She elects to proceed.   The patient placed in the right lateral decubitus position.  Two areas  at the midaxillary line, one approximately 3 cm posterior to the  midaxillary, line one right at the midaxillary line, were marked and  prepped with Betadine and alcohol and entered with a 27-gauge 5/8- inch  needle inserted to bone contact, lower margin of the eleventh rib.  Paresthesia is elicited.  The needle is slightly drawn back and then at  each site 1.5 mL of a solution containing 0.5 mL of 4 mg/mL Kenalog plus  2.5 mL of 1% lidocaine were injected.  The patient tolerated the  procedure well.  Injection done after negative drawback for blood.  Post  injection instructions given.      Erick Colace, M.D.  Electronically Signed     AEK/MEDQ  D:  10/01/2006 08:57:54  T:  10/01/2006 14:18:01  Job:  [id number]

## 2010-06-27 NOTE — Discharge Summary (Signed)
Jean Mitchell, Jean Mitchell                 ACCOUNT NO.:  [id number]   MEDICAL RECORD NO.:  [id number]          PATIENT TYPE:  INP   LOCATION:  4714                         FACILITY:  [facility]   PHYSICIAN:  Valerie A. Felicity Coyer, MDDATE OF BIRTH:  January 22, 1928   DATE OF ADMISSION:  04/21/2007  DATE OF DISCHARGE:  04/29/2007                               DISCHARGE SUMMARY   DISCHARGE DIAGNOSES:  1. Right-sided pneumonia with pleural effusions and Haemophilus      influenzae sepsis.  2. Advanced chronic obstructive pulmonary disease, home O2 dependent.  3. Chronic diastolic heart failure.  4. Acute renal insufficiency.  5. Hypokalemia.  6. Depression.  7. Dyslipidemia.  8. Chronic lumbar back pain.  9. Hypertension.  10.Osteoporosis.  11.Insomnia.  12.History of esophageal stricture.   HISTORY OF PRESENT ILLNESS:  Jean Mitchell is a 77 year old white female who  presented on April 21, 2007, with chief complaint of fever of 102 as well  as pain on the right side of her chest, which started in the shoulder  and goes down.  She also noted a cough, which is productive of pink  mucus.  She is admitted for further evaluation and treatment.   PAST MEDICAL HISTORY:  1. Hyperlipidemia.  2. Congestive heart failure.  3. Chronic obstructive pulmonary disease.  4. Depression.  5. Chronic lumbar back pain.  6. Abdominal aortic aneurysm.  7. Osteoporosis.  8. Hypertension.  9. Insomnia.  10.History of breast cancer.  11.History of esophageal stricture.   COURSE OF HOSPITALIZATION:  1. Right-sided pneumonia with pleural effusions and Haemophilus      influenzae sepsis.  The patient was admitted and was also seen by      [facility].  She was found to have been      shocked secondary to sepsis during the critical care assessment on      April 21, 2007.  She was started on IV antibiotics, Avelox.  She was      transferred to critical care service at that time.  She did not  require intubation, but was noted to be hypoxic.  Vancomycin was      added.  Urine was checked for Legionella antigen which was      negative.  Urinary Streptococcus pneumoniae negative.  However,      blood cultures from April 21, 2007, grew Haemophilus influenzae.      Respiratory culture showed normal oropharyngeal flora.  Followup      blood cultures from April 24, 2007, remained no growth to date at      the time of this dictation.  The patient was noted to be anemic      with iron deficiency, and we will add an oral iron supplement at      the time of discharge.  This will need outpatient followup.  She is      currently on day #8 out of 10 planned days of antibiotic therapy.      She was noted to be hypoxic on oxygen, now down into the 80s with  activity and states that she was wearing her oxygen on a nightly      and p.r.n. basis prior to this admission.  She was urged to wear      her oxygen at all times.  She verbalizes understanding.  Chest x-      ray performed on April 27, 2007, noted improved bilateral airspace      disease with moderate pleural effusions and no acute findings.  2. Chronic diastolic heart failure.  The patient will be continued on      her Lasix as prior to admission, and we have added a low-dose      potassium supplement as she is noted to be mildly hypokalemic.      This, however, will need close outpatient monitoring.  3. Iron deficiency anemia.  We will add an oral iron supplement at      this time.  Consider further outpatient workup of iron deficiency      anemia.  We will defer to the patient's primary MD.  4. Acute renal insufficiency.  The patient was noted to be in acute      renal insufficiency on admission with a creatinine of 1.27, this      has trended downward.  Creatinine at the time of discharge is 0.61.  5. Depression.  The patient has remained stable.  She refuses Zoloft      as she states this makes her feel shaky.  She states that she  has      not been taking it prior to admission for this reason.  6. History of esophageal stricture.  In setting of the iron deficiency      anemia, history of esophageal stricture, and current steroids, we      will continue proton pump inhibitor at the time of discharge.   MEDICATIONS AT THE TIME OF DISCHARGE:  1. K-Dur 20 mEq p.o. daily.  2. Nasonex 2 sprays each nostril daily.  3. Robitussin DM 2 teaspoons every 6 hours as needed.  4. Protonix 40 mg p.o. daily.  5. Lasix 40 mg p.o. b.i.d.  6. Iron 325 mg p.o. b.i.d.  7. Benazepril 10 mg p.o. daily.  8. Lipitor 20 mg p.o. daily.  9. Prednisone 10-mg tab 2 tablets p.o. nightly; then 3 tablets on      April 30, 2007, and May 01, 2007; then 2 tablets on May 02, 2007, and May 03, 2007; then 1 tablet on May 04, 2007, and      May 05, 2007; then stop.  10.Advair 100/50 one puff twice daily.  11.Spiriva 18 mcg 1 puff daily.  12.Avelox 400 mg p.o. daily x3 days.  13.Albuterol inhaler 2 puffs every 6 hours as needed.  14.Vitamin D as before.  15.Clonazepam 0.5 mg 3 tablets by mouth at bedtime.  16.Vicodin 10/325 one tablet 3 times daily as needed.   PERTINENT LABORATORY DATA:  At the time of discharge, potassium 3.3, BUN  13, creatinine 0.61, iron 15, TIBC 171, hemoglobin 9.6, and hematocrit  29.   DISPOSITION:  The patient will be discharged to home with home O2 two  liters nasal cannula continuously.   FOLLOWUP:  The patient is to follow up with Dr. Willow Ora in 1 week.      Sandford Craze, NP      Raenette Rover. Felicity Coyer, MD  Electronically Signed    MO/MEDQ  D:  04/29/2007  T:  04/29/2007  Job:  [id number]

## 2010-06-27 NOTE — Assessment & Plan Note (Signed)
[facility]                            CARDIOLOGY OFFICE NOTE   Jean Mitchell, Jean Mitchell                        MRN:          [id number]  DATE:08/05/2006                            DOB:          01-20-1928    PRIMARY CARE PHYSICIAN:  Willow Ora, M.D.   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  She was  hospitalized in 2006, with congestive heart failure related to diastolic  dysfunction.  I last saw her in December 2007.  At that time she was  well compensated with her medications at that time.  She says she has  been doing fairly well with her heart and no chest pain, shortness of  breath, palpations, or edema.  She recently had a CT for evaluation of abdominal pain and was found to  have a small abdominal aortic aneurysm which was sized on CT as 2.6 x  2.8-cm.   PAST MEDICAL HISTORY:  1. Hypertension.  2. Hyperlipidemia.  3. COPD.  4. She has also had a history of breast cancer.   CURRENT MEDICATIONS:  Clonazepam, benazepril, Lasix, hydrocodone, K-Dur,  Advair, Vytorin.   PHYSICAL EXAMINATION:  VITAL SIGNS:  The blood pressure is 122/76, pulse  62 and regular.  NECK:  There is no vein distention.  The carotid pulses were full  without bruits.  CHEST:  Clear.  HEART:  Rhythm was regular.  I heard no murmurs or gallops.  ABDOMEN:  Soft without organomegaly.  EXTREMITIES:  There was no peripheral edema and the pedal pulses were  equal.   IMPRESSION:  1. Congestive heart failure related to diastolic dysfunction, now      compensated.  2. Hypertension, under good control.  3. History of venous insufficiency.  4. Hyperlipidemia.  5. Chronic obstructive pulmonary disease.  6. Breast cancer.  7. Small abdominal aortic aneurysm.   RECOMMENDATIONS:  Jean Mitchell appears to be well compensated from the  standpoint of congestive heart failure.  Her aneurysm is quite  small and  probably need annual surveillance at  most.  She does have an appointment to see Dr. Hart Rochester in the near future  regarding this as arranged by Dr. Drue Novel.  I will plan to see her back in  cardiac followup in a year.     Bruce Elvera Lennox Juanda Chance, MD, [facility]  Electronically Signed    BRB/MedQ  DD: 08/05/2006  DT: 08/05/2006  Job #: [id number]

## 2010-06-27 NOTE — Consult Note (Signed)
NEW PATIENT CONSULTATION   Jean Mitchell, Jean Mitchell  DOB:  1927/04/08                                       08/12/2006  CHART#:[id number]   REASON FOR CONSULTATION:  Jean Mitchell presents today for evaluation  of an incidental finding of a 2.8 cm ectasia of her infra-renal aorta.  She had a CAT scan performed on July 17, 2006, for evaluation of  abdominal pain.  She reports this pain has been present for quite some  time and is lower abdominal, but more recently has been strictly in her  left flank, between the level of her costal margin and hip.  She reports  having congestive heart failure several months ago and reports this has  continued since that time.  She had no prior known history of aneurysm.   PAST MEDICAL HISTORY:  1. Significant for hypertension.  2. Elevated cholesterol.  3. Congestive heart failure.  4. Emphysema.   SOCIAL HISTORY:  She is single with two children.  She is retired.  She  quit smoking in June 2006.  Does not drink alcohol on a regular basis.   REVIEW OF SYSTEMS:  Positive for weight loss, loss of appetite.  Arthritic joint pain and depression.   ALLERGIES:  No known drug allergies.   PHYSICAL EXAMINATION:  GENERAL:  A well-developed and well-nourished  white female, appearing her stated age of 83.  VITAL SIGNS:  Blood pressure 120/74, pulse 64, respirations 18.  ABDOMEN:  Reveals a thin abdomen.  I do not palpate an aneurysm.  EXTREMITIES:  She does have 2+ femoral pulses and no evidence of  peripheral aneurysms.   RECOMMENDATIONS:  I discussed the significance of her small abdominal  ectasia with her.  I have recommended that we see her in six months for  a repeat ultrasound.  I explained that at her age of 61, and the very  small ectasia of her aorta, it would be quite unlikely that she would  ever require treatment for this.  I have recommended that we continue to  follow her with serial ultrasound exams.   She did request  pain medication, and I have deferred this to her medical  doctor.  Also she is to see someone in the pain clinic later this month,  for evaluation of the left flank pain.   Larina Earthly, M.D.  Electronically Signed   TFE/MEDQ  D:  08/12/2006  T:  08/13/2006  Job:  [id number]   cc:   Willow Ora, MD

## 2010-06-27 NOTE — Assessment & Plan Note (Signed)
[name] returns today.  She is a 77 year old female with a history of  osteoporosis, multiple lumbar compression fractures, status post  kyphoplasty T8, L1, L2 in May of 2006.  However, did not really cause  any improvement in her symptoms.  Her main symptoms are left-sided flank  and rib pain, appears to be exacerbated when she leans towards that  side.  She feels an electric sensation and a stinging sensation along  that left rib.  She has had no falls since I last saw her.  She has had  imaging studies since last visit, which I did review with the reading  radiologist, Dr. Augusto Gamble.  She had a mild compression deformity with  edema to superior end plate at L5, as well as superior end plate B14.  She has developed some central stenosis at the L3-4 area.  She has some  degenerative changes in the lumbar facet joint at L4-5.   There was some question whether the end plate fractures could be  metastatic disease.  However, given that her overall breast CA is not  active at this time, plus her prior history of multiple lumbar  compression fractures with severe osteoporosis, it is felt to be the  latter.  Of note is that an x-ray dated June 05, 2006 did demonstrate  some slight superior end plate compression at L5.   MEDICATIONS:  She is on hydrocodone 10/500, prescribed for 5 tablets per  day, i.e., 1 t.i.d. and 2 nightly.  She is really not taking as many as  she is prescribed, in fact she is taking somewhere between 3 and 4 a day  based on pill count.  Her urine drug screen was appropriate for the  medications as utilized.  She appears to be taking some propoxyphene  rather than Norco.   This will need to be rechecked on next visit.   EXAMINATION:  She has some tenderness to lower rib margin with some  shooting pain with palpitations.  She has a very small space between her  anterior rib margin and her iliac crest.  She ambulates without evidence  of knee instability.  She has some  mild tenderness in the lumbar spine  area as well.   IMPRESSION:  1. 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  2. Osteoporosis with a history of multiple compression fractures.  3. Lumbar spinal stenosis.  She likely has facet arthropathy as well.      Difficulty in this situation is that, while she may have benefit      from epidural steroid injections, as well as lumbar facet      injections, the use of corticosteroids should be minimized in this      situation.  Diagnostic facet injections can be done without      corticosteroids, and radiofrequency can be done using mainly the      lidocaine plus/minus a small amount of corticosteroids.  We will      explore this further should she not get relief with current      treatment regimen, which will consist of intercostal nerve block      and initiating Neurontin 100 mg nightly building up to q.i.d. and      continue hydrocodone.      Erick Colace, M.D.  Electronically Signed     AEK/MedQ  D:  10/01/2006 09:05:16  T:  10/01/2006 12:12:50  Job #:  [id number]   cc:   Willow Ora, MD  [phone] [address]  [address] [place], [place] [address]

## 2010-06-27 NOTE — Assessment & Plan Note (Signed)
Jean Mitchell returns today.  She is a 77 year old female with a history of  osteoporosis and multiple lumbar compression fractures, status post  kyphoplasty at T8 and L1-L2 in May of 2006.  She has had intercostal  neuralgia, although today she has more pain in the lumbar region.  We  initiated Gabapentin last visit; however, she stated she broke out in  welts on the same day that she started taking it.  She had about two  doses of 100 mg each.  She continues on the current medication for pain;  hydrocodone 10/500 five tablets per night, with the original  prescription, however, she is really taking it more like four times a  day and that is what I wrote for last month and she has not run out.   We did perform the urine drug screen this visit to assess for potential  multiple prescribers.  She did have a positive propoxyphene in July  along with her hydrocodone, but she attributes this to having some  leftover Darvocet that she had taken.   REVIEW OF SYSTEMS:  Positive for poor appetite, coughing, shortness of  breath, she has had a bout of bronchitis in the interval time period,  and a past history of CHF as well as breast carcinoma, which is in  remission.   PHYSICAL EXAMINATION:  VITAL SIGNS:  Blood pressure is 114/69, pulse 84,  respirations 18, O2 SAT 92% on room air.  GENERAL:  In no acute distress, mood and affect appropriate.  PAIN AND REHAB EVALUATION:  Her gait is somewhat stenotic-appearing;  otherwise, no evidence of toe dragging or knee instability.  She has no  tenderness over the ribs today.  She had had some tenderness midline in  the back.  Pain increases with extension compared to with flexion.   She has good strength in the lower extremities, some decreased hip  internal and external rotation bilaterally.   IMPRESSION:  Lumbar spinal stenosis and probable facet arthropathy.  She  would benefit from a medial branch block that can be performed without  corticosteroid  using only Lidocaine.  We did review the recent lumbar  magnetic resonance imaging, which was done on September 09, 2006, with the  question of low paracentral disk annular tear with possible right S1  radiculitis; however, she does not have any asymmetry in terms of lower  extremity complaints and mainly her pain is in the axial area.   As noted, I recommend medial branch blocks, will continue her  hydrocodone, will hold off on medication for intercostal neuralgia other  than the above, but other potential neurogenic pain medicines would  include Lyrica versus Topamax.      Erick Colace, M.D.  Electronically Signed     AEK/MedQ  D:  10/28/2006 15:27:56  T:  10/28/2006 21:30:31  Job #:  [id number]   cc:   Willow Ora, MD  [phone] [address] [place], [place] [address]

## 2010-06-27 NOTE — Group Therapy Note (Signed)
Consult requested by Willow Ora, MD, for evaluation of left-sided back and  rib pain.   HISTORY:  A 77 year old female with a history of osteoporosis and  multiple lumbar compression fractures.  She has undergone a kyphoplasty,  L1, L2 and T8, in May 2006.  This did not cause any improvement in her  symptoms.  She has been treated with narcotic analgesics, both Lorcet  100 a month.  Has been tried on OxyContin 20 mg b.i.d.  She has been  seen by orthopedics in 2006 and had some type of in-office injection,  which caused some increase in pain.  Celebrex was added.  She has a  history of CHF that was compensated.  She has had a new diagnosis of  breast carcinoma without axillary nodes.  She had a left partial  mastectomy on November 08, 2005, and postoperative radiation therapy.  She thinks that after the radiation therapy her left-sided rib and flank  pain seemed to get worse.  She saw Dr. Ethelene Hal from physiatry at  [facility] as well.  She thinks she got some type of  epidural injections and she thinks it made her swell up but really does  not have any idea where the injection was done or any other details.  She does indicate that she has had cortisone before without difficulties  as well as local anesthetic without difficulties.  She has tried  Lidoderm patch over that area, which did help but was too expensive  since she missed some Medicare Part D payments and is no longer in that  program.  Most recent imaging studies include a left hip two views,  which was normal; lumbar spine showing multiple lumbar compression  deformities, slight anterolisthesis of L4 on L5, degenerative changes in  the lumbar facet joints.  She had a pelvic x-ray June 05, 2006, which  was negative.   Functionally she is doing quite well.  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  She does her own canning.   She  does all her self-care.  Her pain level is graded at a 6/10, but this is  with medications.  She has been taking hydrocodone one tablet three  times a day and two tablets at night.  This helps her rest.  She has  chronic problems with constipation, which she manages with Vear Clock  Caplets 2 tablets every other night.  Her medical physician is Dr. Willow Ora.   PAST SURGICAL HISTORY:  In addition to above, she has had right knee  arthroscopy May 11, 2005, per Dr. Lequita Halt with debridement of a  lateral meniscal tear.  She has had cardiology evaluations, last on August 05, 2006, with Dr. Charlies Constable.  CHF was compensated and on August 12, 2006, she saw Dr. Arbie Cookey from vascular surgery to follow up on a CT of  the abdomen which showed some aortic ectasia with less than 3-cm  aneurysm, and a serial ultrasound was recommended as follow-up.   Recent x-rays reviewed demonstrate L1-2 vertebroplasty with L5 superior  endplate compression and biconcave shape to L3 and L4.   She has not had any new medical problems recently.   SOCIAL:  She is divorced.  As noted above, she still works.   FAMILY HISTORY:  Heart disease, high blood pressure.   EXAMINATION:  Blood pressure 105/65, pulse 93, respiratory rate 16,  O2  saturation 98% on room air.  GENERAL:  In no acute distress, mood and affect appropriate.  She is  alert.  Forward-stooped but otherwise normal.  Her lower extremity sensation is reduced in the knees, feet and ankles  bilaterally compared to the upper extremities.  There is no evidence of  intrinsic atrophy.  She has no evidence of swelling in the lower  extremities.  She had good range of motion in the hips, knees and  ankles.  Upper extremity strength is normal.  Lower extremity strength  is normal as well.  She has some tenderness around the lower border of  T11-12 as well as upper iliac crest area.  She has tenderness in the  paraspinal muscles bilaterally in the lumbar area.   She has no evidence  of pinprick abnormalities to sensation in the thoracic area.   IMPRESSION:  1. Chronic left-sided lower costal margin pain.  It does not appear to      be an intra-abdominal process, has had workup in this regard.  It      does not appear to be an intercostal neuralgia given the mechanical      nature of this.  I suspect that she has an upper lumbar facet      problem that may be causing some radiating pain.  I would like to      rule out any new compression fractures and will get an MRI of the      lumbar spine to do so and then consider lumbar facet medial branch      blocks, blocking at T12, L1, L2 to see if this would help to      alleviate her pain in the left side.  If so, may benefit from a      radiofrequency procedure.  2. I have checked a urine drug screen and have written her same dosage      of hydrocodone 10/325 mg one p.o. t.i.d., two p.o. q.h.s.  I will      see her back in one month to follow up on her MRI.      Erick Colace, M.D.  Electronically Signed     AEK/MedQ  D:  09/02/2006 11:37:45  T:  09/02/2006 12:13:21  Job #:  [id number]   cc:   Willow Ora, MD  [phone] [address] [place], [place] [address]

## 2010-06-27 NOTE — Assessment & Plan Note (Signed)
[facility]                         GASTROENTEROLOGY OFFICE NOTE   Jean, Mitchell                        MRN:          [id number]  DATE:06/19/2007                            DOB:          07/27/1927    Jean Mitchell is a very nice 77 year old patient of Dr. Drue Novel and Dr. Charlies Constable who is here today for consultation of iron-deficiency anemia.  On  April 27, 2007, hemoglobin 9.6, hematocrit 29.30 with MCV of 97.  On  April 24, 2007, hemoglobin 8.7, hematocrit 25.8 with MCV of 96.  Her  iron saturation was 5%.  B12 levels were normal.  Patient denies any  rectal bleeding.  We have seen her in the past for adenomatous polyps of  the colon which were initially found on colonoscopy in June 1998 and  subsequently were hyperplastic polyp in 2001.  Last colonoscopy November  2007 did not show any recurrent polyps.  There was mild diverticulosis  of the sigmoid colon.  She also had external hemorrhoids.  There is a  history of dysphagia which was treated with esophageal dilation during  upper endoscopy in 2004.  There was no definite stricture, but she had  small hiatal hernia and superficial erosions.  She at that time was  taking Celebrex.  Patient has a history of chronic anemia.  In her old  chart in our office in 1998, her hemoglobin was 10.2 on several  occasions.   PAST MEDICAL HISTORY:  1. Congestive heart failure with diastolic dysfunction followed by Dr.      Charlies Constable.  She is an ex-smoker.  2. She has a history COPD with recent history of hospitalizations for      pneumonia.  3. There is history of hyperlipidemia.  4. Breast cancer in November 2007 involving left breast.   OPERATIONS:  1. Hemorrhoidectomy 1965.  2. Hysterectomy 1968.  3. Breast surgery 2007.   FAMILY HISTORY:  Negative for colon cancer.   SOCIAL HISTORY:  Divorced with 2 children.  She used to work at [employer] and currently retired.  Patient does not smoke  and does  not drink alcohol.   REVIEW OF SYSTEMS:  Positive for weight loss of about 40 pounds over  past several years.  She had recent fever associated with pneumonia,  swelling of her feet.  She has arthritic complaints pertaining to her  knees status post arthroscopies by Dr. Lequita Halt, sleeping problems, night  sweats and back pain.   PHYSICAL EXAMINATION:  VITAL SIGNS:  Blood pressure 90/62.  Pulse 90 and  weight 121 pounds.  She was alert, oriented in no distress.  She had mild kyphosis of the thoracic spine.  Oral cavity was normal.  NECK:  Supple, no lymphadenopathy.  LUNGS:  With decreased breath sounds.  COR:  With __________ normal S1, normal S2.  ABDOMEN:  Soft, nontender with normoactive bowel sounds.  No distention.  No bruit.  There was minimal tenderness in left upper quadrant.  RECTAL:  With dark Hemoccult-negative stool.   IMPRESSION:  A 77 year old white female with history of chronic  anemia  currently progressed.  Hemoglobin 9.8 on recent exam, but she is  Hemoccult-negative.  She is up to date on her colonoscopy, last one  November 2007.  She had 2 prior colonoscopies with findings of  adenomatous polyps.  Her anemia may be multifactorial.  I am concerned  about possibly recurrent esophageal stricture, since she has some solid  food dysphagia and has had some regurgitation of food at times.  One can  wonder whether she could possibly have had aspiration pneumonia  recently.   PLAN:  1. Upper endoscopy with possible esophageal dilation and appropriate      biopsies to rule out upper gastrointestinal source of bleeding.  2. Patient is up to date on her colonoscopies.  3. Continue iron supplements.  Recheck h and h and iron studies      periodically until iron-deficiency has been resolved.     Hedwig Morton. Juanda Chance, MD  Electronically Signed    DMB/MedQ  DD: 06/19/2007  DT: 06/19/2007  Job #: [id number]   cc:   Willow Ora, MD

## 2010-06-27 NOTE — Assessment & Plan Note (Signed)
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  She was hospitalized in 2006 with  heart failure related to diastolic dysfunction.  She had been doing  fairly well since that time from a cardiac standpoint, has had no recent  chest pain or swelling.  She does have chronic shortness of breath  related to her chronic obstructive pulmonary disease.   She was hospitalized with pneumonia with Haemophilus influenzae sepsis.  She was quite sick and developed acute renal insufficiency with this,  but finally recovered.  She is now doing well.   Her past medical history is significant for hypertension,  hyperlipidemia, and COPD.  She also has a history of breast cancer and  followed in the [facility]cology [facility].   Her current medications include iron, Lasix 40 mg daily, benazepril 10  mg daily, p.r.n. oxygen at home, Lipitor 20 mg daily, and potassium 20  mEq daily.   PHYSICAL EXAMINATION:  VITAL SIGNS:  Blood pressure is 114/73 and the  pulse 84 and regular.  NECK:  There was no venous distension.  The carotid pulses were full  without bruits.  CHEST:  Clear.  CARDIAC:  Rhythm was regular.  No murmurs or gallops.  ABDOMEN:  Soft.  No organomegaly.  EXTREMITIES:  Peripheral pulses are full and no peripheral edema.   An electrocardiogram showed sinus rhythm and was normal.   IMPRESSION:  1. Diastolic heart failure, now compensated and euvolemic.  2. Hypertension, under good control.  3. Hyperlipidemia, treated.  4. Chronic obstructive pulmonary disease with home O2 and recent      Haemophilus influenzae pneumonia.  5. History of breast cancer.   RECOMMENDATIONS:  I think Ms. Cavan is doing well from the standpoint of  her heart.  We renewed her potassium prescription today.  I will plan to  continue the same medications.  I will see her back in followup in a  year.     Bruce Elvera Lennox Juanda Chance, MD, [facility]  Electronically Signed    BRB/MedQ  DD: 08/28/2007  DT: 08/29/2007  Job #: [id number]

## 2010-06-29 ENCOUNTER — Ambulatory Visit (INDEPENDENT_AMBULATORY_CARE_PROVIDER_SITE_OTHER): Payer: Medicare Other | Admitting: Cardiology

## 2010-06-29 ENCOUNTER — Encounter: Payer: Self-pay | Admitting: Cardiology

## 2010-06-29 ENCOUNTER — Other Ambulatory Visit: Payer: Self-pay | Admitting: *Deleted

## 2010-06-29 DIAGNOSIS — Z79899 Other long term (current) drug therapy: Secondary | ICD-10-CM

## 2010-06-29 DIAGNOSIS — I714 Abdominal aortic aneurysm, without rupture: Secondary | ICD-10-CM

## 2010-06-29 DIAGNOSIS — I5032 Chronic diastolic (congestive) heart failure: Secondary | ICD-10-CM

## 2010-06-29 DIAGNOSIS — E7800 Pure hypercholesterolemia, unspecified: Secondary | ICD-10-CM

## 2010-06-29 DIAGNOSIS — I509 Heart failure, unspecified: Secondary | ICD-10-CM

## 2010-06-29 DIAGNOSIS — I359 Nonrheumatic aortic valve disorder, unspecified: Secondary | ICD-10-CM

## 2010-06-29 DIAGNOSIS — I350 Nonrheumatic aortic (valve) stenosis: Secondary | ICD-10-CM

## 2010-06-29 LAB — BASIC METABOLIC PANEL
BUN: 11 mg/dL (ref 6–23)
CO2: 33 mEq/L — ABNORMAL HIGH (ref 19–32)
Calcium: 9.8 mg/dL (ref 8.4–10.5)
Glucose, Bld: 58 mg/dL — ABNORMAL LOW (ref 70–99)
Potassium: 5.4 mEq/L — ABNORMAL HIGH (ref 3.5–5.1)
Sodium: 143 mEq/L (ref 135–145)

## 2010-06-29 LAB — HEPATIC FUNCTION PANEL
AST: 18 U/L (ref 0–37)
Bilirubin, Direct: 0 mg/dL (ref 0.0–0.3)
Total Bilirubin: 0.5 mg/dL (ref 0.3–1.2)

## 2010-06-29 LAB — LIPID PANEL
HDL: 95.3 mg/dL (ref >39.00)
Total CHOL/HDL Ratio: 2

## 2010-06-29 MED ORDER — HYDROCODONE-ACETAMINOPHEN 10-325 MG PO TABS: 1.0000 | ORAL_TABLET | Freq: Four times a day (QID) | ORAL | Status: DC | PRN

## 2010-06-29 NOTE — Assessment & Plan Note (Signed)
Continue statin. Check lipids and liver. 

## 2010-06-29 NOTE — Telephone Encounter (Signed)
Ok 120 and 1 RF

## 2010-06-29 NOTE — Assessment & Plan Note (Signed)
Euvolemic on examination. Continue present dose of diuretics. Check potassium and renal function. 

## 2010-06-29 NOTE — Patient Instructions (Signed)
Your physician wants you to follow-up in: ONE YEAR You will receive a reminder letter in the mail two months in advance. If you don't receive a letter, please call our office to schedule the follow-up appointment.   Your physician has requested that you have an abdominal aorta duplex. During this test, an ultrasound is used to evaluate the aorta. Allow 30 minutes for this exam. Do not eat after midnight the day before and avoid carbonated beverages   Your physician recommends that you return for lab work in: TODAY

## 2010-06-29 NOTE — Assessment & Plan Note (Signed)
Plan followup echocardiograms in the future. 

## 2010-06-29 NOTE — Assessment & Plan Note (Signed)
Followup abdominal ultrasound in July 2012.

## 2010-06-29 NOTE — Progress Notes (Signed)
HPI: Pleasant female previously followed by Dr. Juanda Chance for diastolic congestive heart failure. Last nuclear study was performed in September 2010 and showed an ejection fraction of 63% and normal perfusion. Echocardiogram in July of 2011 showed normal LV function and mild aortic stenosis with a mean gradient of 10 mm of mercury. Abdominal ultrasound in July 2011 showed an aneurysm at 2.6 cm. She was last seen in September of 2010. Since then, the patient has dyspnea with more extreme activities but not with routine activities. It is relieved with rest. It is not associated with chest pain. There is no orthopnea, PND or pedal edema. There is no syncope or palpitations. There is no exertional chest pain.   Current Outpatient Prescriptions  Medication Sig Dispense Refill  . albuterol (PROVENTIL) (2.5 MG/3ML) 0.083% nebulizer solution Take 3 mLs (2.5 mg total) by nebulization 4 (four) times daily.  1080 mL  0  . chlordiazePOXIDE (LIBRIUM) 5 MG capsule 5 mg. 1 in the morning, 1 in the afternoon and 2 at bedtime       . citalopram (CELEXA) 20 MG tablet 20 mg. 1.5 tablets daily       . fluticasone (FLONASE) 50 MCG/ACT nasal spray 2 sprays by Nasal route daily.        . Fluticasone-Salmeterol (ADVAIR DISKUS) 100-50 MCG/DOSE AEPB Inhale 1 puff into the lungs every 12 (twelve) hours.        . furosemide (LASIX) 20 MG tablet Take 20 mg by mouth daily.        Marland Kitchen HYDROcodone-acetaminophen (NORCO) 10-325 MG per tablet Take 1 tablet by mouth every 6 (six) hours as needed.        Marland Kitchen ibuprofen (ADVIL,MOTRIN) 200 MG tablet Take 200 mg by mouth 2 (two) times daily.        . pravastatin (PRAVACHOL) 40 MG tablet Take 1 tablet (40 mg total) by mouth daily.  30 tablet  0     Past Medical History  Diagnosis Date  . Breast cancer 2007    s/p XRT on Tamoxifen  . Hyperlipidemia   . CHF (congestive heart failure)   . COPD (chronic obstructive pulmonary disease)     o2 at night  . Depression   . AAA (abdominal aortic  aneurysm)   . Back pain     lumbar, chronic  . Hypertension   . Osteoporosis     dexa 02-18-08  . Insomnia   . Esophageal stricture   . H/O: hysterectomy     Past Surgical History  Procedure Date  . Oophorectomy     unilateral  . Knee arthroscopy     right   . Hemorrhoid surgery     History   Social History  . Marital Status: Divorced    Spouse Name: N/A    Number of Children: 2  . Years of Education: N/A   Occupational History  . retired    Social History Main Topics  . Smoking status: Former Smoker    Quit date: 02/13/2004  . Smokeless tobacco: Not on file  . Alcohol Use: Not on file  . Drug Use: Not on file  . Sexually Active: Not on file   Other Topics Concern  . Not on file   Social History Narrative  . No narrative on file    ROS: no fevers or chills, productive cough, hemoptysis, dysphasia, odynophagia, melena, hematochezia, dysuria, hematuria, rash, seizure activity, orthopnea, PND, pedal edema, claudication. Remaining systems are negative.  Physical Exam: Well-developed well-nourished in no  acute distress.  Skin is warm and dry.  HEENT is normal.  Neck is supple. No thyromegaly.  Chest is clear to auscultation with normal expansion.  Cardiovascular exam is regular rate and rhythm. 2/6 systolic murmur lower left sternal border. Abdominal exam nontender or distended. No masses palpated. Extremities show no edema. neuro grossly intact  ECG Normal sinus rhythm at a rate of 66. No significant ST changes.

## 2010-06-29 NOTE — Assessment & Plan Note (Signed)
Continue present blood pressure medications. 

## 2010-06-30 NOTE — Assessment & Plan Note (Signed)
[facility]                            CARDIOLOGY OFFICE NOTE   DOMINQUE, LEVANDOWSKI                        MRN:          [id number]  DATE:01/14/2006                            DOB:          [birth year]    PRIMARY CARE PHYSICIAN:  Willow Ora, M.D.   CLINICAL HISTORY:  Mrs. Milberger is 77 years old and has congestive heart  failure related to diastolic dysfunction.  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 I took care of her brother, Marshell Levan, for  many years.  She was hospitalized in 2006 with congestive heart failure  and she has been managed with diuretics and has done fairly well since  that time.  She says she feels like the fluid is now under good control  and she has had no major symptoms of shortness of breath and no chest  pain or palpitations.  She was last seen in our office by Dr. Gala Romney  on December 6, but because I knew her previously, she requested long-  term followup with me.   PAST MEDICAL HISTORY:  1. Hypertension.  2. She also has had some venous insufficiency in the lower      extremities.  3. Hyperlipidemia.  4. COPD.  5. She has no documented coronary disease in the past. Previous      nonischemic Cardiolite scan showed good left ventricular function      with an ejection fraction of 69%.  Did not seen an echo in the      chart.  6. She recently had a lumpectomy and was found to have breast cancer      on the left side and is scheduled for radiation therapy beginning      this week.   CURRENT MEDICATIONS:  Clonazepam, benazepril, Caltrate,  Lasix,  hydrocodone, K-Dur, Advair, cocutan, Vytorin, Combivent.   PHYSICAL EXAMINATION:  VITAL SIGNS:  Blood pressure 120/72, pulse 91 and  regular.  NECK:  There was no venous distention.  Carotid pulses were full without  bruits.  CHEST:  Clear without rales or rhonchi.  Breath sounds were slightly  decreased.  CARDIAC:  Rhythm was regular.  The heart sounds were  normal.  No  murmurs.  No gallops.  ABDOMEN:  Soft with normal bowel sounds.  No hepatosplenomegaly.  EXTREMITIES:  Peripheral pulses were equal and there was __________ .   LABORATORY DATA:  An electrocardiogram was normal.   IMPRESSION:  1. Congestive heart failure related to diastolic dysfunction, now      compensated.  2. Hypertension.  3. History of venous insufficiency.  4. Hyperlipidemia.  5. Chronic obstructive pulmonary disease.  6. Cancer of the breast, undergoing radiation therapy.   RECOMMENDATIONS:  I think Mrs. Regula is very well compensated from the  standpoint of congestive heart failure and her blood pressure is under  good control.  We will not make any changes in her medications today.  We will get a BMP and BNP on her today. She had other labs done recently  as part of her cancer work up.  I will plan to see her in six months.  I  told her to watch her weight and watch her fluid and call us if she has  any change in the interim.     Bruce Elvera Lennox Juanda Chance, MD, [facility]  Electronically Signed    BRB/MedQ  DD: 01/14/2006  DT: 01/15/2006  Job #: [id number]

## 2010-06-30 NOTE — Consult Note (Signed)
Jean Mitchell, Jean Mitchell                 ACCOUNT NO.:  [id number]   MEDICAL RECORD NO.:  [id number]          PATIENT TYPE:  OUT   LOCATION:  XRAY                         FACILITY:  [facility]   PHYSICIAN:  Sanjeev K. Deveshwar, M.D.DATE OF BIRTH:  1927/03/13   DATE OF CONSULTATION:  DATE OF DISCHARGE:                                   CONSULTATION   BRIEF HISTORY:  This is a very pleasant 77 year old female followed by Dr.  Claudette Stapler as her primary care physician, her orthopedist is Dr. Lequita Halt.  The  patient has a history of compression fractures, she has been treated by Dr.  Eustace Quail on two different occasions.  On Jun 12, 2004, she had a  kyphoplasty at the L1 level, she was to have two other levels done at that  time, however, the patient was experiencing severe pain and the other levels  were not performed.  On Jun 16, 2004, the patient returned and had  kyphoplasties performed at T8 and L2 under anesthesia.  She tolerated this  much better.  The pathology report on all of these procedures showed no sign  of any malignancy.   The patient is seen back today for followup, she is accompanied once again  by her son.  She reports that she is doing well, she has no pain at the  kyphoplasty sites.  She has been experiencing some chest discomfort, she has  had a cough and sputum and was treated with antibiotics by Dr. Claudette Stapler.  She  had a CT scan of her chest performed on May 9, this showed an opacity  posteriorly at the right lung base, inferiorly, consistent with atelectasis  scarring and/or possible pneumonia.  A possible follow-up chest x-ray was  recommended.  She also had changes consistent with COPD.  I discussed these  results with the patient and her son at their visit today.  I told them that  this might be the reason for her continued chest discomfort.  She tells me  that her pain is worse with inspiration.  She also may still be having some  referred pain from her recent kyphoplasties,  however, this should improve  with time.  She is currently taking Vicodin p.r.n. and I have recommended  that she try some scheduled Tylenol or Advil with possible heating pad  applied to her shoulders as she is having some discomfort there.  I also  questioned her about the possibility of coronary artery disease as a source  of her pain, however, from what she tells me it sounds more consistent with  either musculoskeletal-type pain or possibly pulmonary in nature.   Other history for this patient is significant for COPD, hypertension,  hyperlipidemia, osteoporosis, congestive heart failure, and a remote tobacco  history.   IMPRESSION AND PLAN:  As noted, the patient appears to be doing well  following her kyphoplasties performed May 1 and May 5. As noted, the  pathology reports came back negative.  She is no long having any pain at  these sites.  I have told her to gradually resume her previous activities,  however, she will still be at risk for further  compression fractures.  She has been told to limit bending, twisting,  stooping, and lifting as much as possible.  I told her she could resume  driving in another couple of days.  Followup from here will be on a p.r.n.  only basis.  The patient and her son have our phone number and have been  told to call if they have any further questions or problems.      DR/MEDQ  D:  06/26/2004  T:  06/26/2004  Job:  [id number]   cc:   Dr. Renaee Munda, M.D.  [facility]  [address]  [address]  [place]  [place] [address]  Fax: [phone]

## 2010-06-30 NOTE — Op Note (Signed)
NAMEHOORIA, Jean Mitchell                 ACCOUNT NO.:  [id number]   MEDICAL RECORD NO.:  [id number]          PATIENT TYPE:  AMB   LOCATION:  DSC                          FACILITY:  [facility]   PHYSICIAN:  Rose Phi. Maple Hudson, M.D.   DATE OF BIRTH:  [birth year]   DATE OF PROCEDURE:  11/08/2005  DATE OF DISCHARGE:                                 OPERATIVE REPORT   PREOPERATIVE DIAGNOSIS:  Stage I carcinoma of the left breast.   POSTOPERATIVE DIAGNOSIS:  Stage I carcinoma of the left breast.   OPERATION:  1. Blue dye injection.  2. Left axillary sentinel lymph node biopsy.  3. Left partial mastectomy.   SURGEON:  Rose Phi. Maple Hudson, M.D.   ANESTHESIA:  General.   OPERATIVE PROCEDURE:  Prior to going to the operating room, 1 mCi of  technetium sulfur colloid was injected intradermally.  After suitable  general anesthesia was induced the patient was placed in supine position  with the arms extended on the arm board.  Ten 5 mL of a mixture of 2 mL of  methylene blue and 3 mL of injectable saline was injected in the subareolar  tissue and the breast gently massaged for 3 minutes.  We then prepped and  draped the breast and axilla.   A short transverse axillary incision was made with dissection through  subcutaneous tissue to the clavipectoral fascia.  There was a warm spot that  was not excessively hot, but a blue and lymphatic was going into a bluish  and hot lymph node; and we excised that as a sentinel node.  I carefully  palpated and scanned then could not find any other blue, hot, or palpable  nodes.  This was submitted as the sentinel node.   While that was being evaluated the palpable tumor nodule in the upper outer  quadrant was outlined with the marking pencil.  I then outlined the incision  using a marking pen including an ellipse of skin over this because it was so  superficial.  I then made the incisions then excised the palpable tumor.  Specimen oriented for the pathologist.   Pathologist reported the sentinel node is negative for metastatic disease;  and the margins as clean.   Both incisions were very thoroughly infiltrated with 0.25% Marcaine and then  closed in one-layer of subcuticular for Monocryl and Steri-Strips.  Dressings were applied; and the patient transferred to the recovery room in  satisfactory condition having tolerated procedure well.      Rose Phi. Maple Hudson, M.D.  Electronically Signed     PRY/MEDQ  D:  11/08/2005  T:  11/09/2005  Job:  [id number]

## 2010-06-30 NOTE — Discharge Summary (Signed)
NAMESHAWNE, Jean Mitchell                 ACCOUNT NO.:  [id number]   MEDICAL RECORD NO.:  [id number]          PATIENT TYPE:  INP   LOCATION:  4705                         FACILITY:  [facility]   PHYSICIAN:  Rene Paci, M.D. LHCDATE OF BIRTH:  09-03-1927   DATE OF ADMISSION:  07/01/2004  DATE OF DISCHARGE:  07/04/2004                                 DISCHARGE SUMMARY   DISCHARGE DIAGNOSIS:  1.  Chronic obstructive pulmonary disease with mild pulmonary hypertension.  2.  Diastolic dysfunction with resulting pulmonary edema.   HISTORY OF PRESENT ILLNESS:  The patient is a 77 year old female who was  admitted with a 2-3 day history of shortness of breath.  The patient was  noted at the time of admission to have lower extremity edema as well as mild  chest pain x 4-5 days.  The patient was admitted for further evaluation.   PAST MEDICAL HISTORY:  1.  Hypertension.  2.  Depression.   HOSPITAL COURSE:  COPD and mild pulmonary hypertension.  The patient  underwent 2D echo which showed a normal left ventricular function, however,  revealed mild diastolic dysfunction.  The patient was diuresed with Lasix 20  mg daily to assist with pulmonary edema.  In addition, the patient underwent  serial cardiac enzymes.  CT angiogram was performed which showed no  pulmonary embolus.  On chest x-ray, remote rib fracture was noted.  The  patient was maintained on Albuterol and Atrovent  nebulizers and showed some  improvement.   DISCHARGE MEDICATIONS:  1.  Lasix 20 mg p.o. daily.  2.  Lexapro 10 mg p.o. daily.  3.  Lotensin 10 mg p.o. daily.  4.  K-Dur 10 mEq p.o. daily.  5.  Norvasc 10 mg p.o. daily.  6.  Clonazepam dosing as prior to admission.  7.  Combivent inhaler 2 puffs every 4 hours as needed for shortness of      breath.   DISCHARGE LABORATORY DATA:  Hemoglobin 12.6, hematocrit 37, white blood cell  count 5.9, platelets 346, BUN 6, creatinine 0.8.   DISCHARGE INSTRUCTIONS:  The patient was  scheduled for a follow up  appointment with Dr. Willow Ora on May 30 at 8:45 a.m.  The patient was  requesting cardiology consult and will defer to primary MD.       MSO/MEDQ  D:  08/21/2004  T:  08/21/2004  Job:  [id number]   cc:   Wanda Plump, MD [facility]  [phone] [address] [place], [place] [address]

## 2010-06-30 NOTE — Op Note (Signed)
NAMEYARLIN, BREISCH                 ACCOUNT NO.:  [id number]   MEDICAL RECORD NO.:  [id number]          PATIENT TYPE:  AMB   LOCATION:  NESC                         FACILITY:  [facility]   PHYSICIAN:  Ollen Gross, M.D.    DATE OF BIRTH:  01-01-1928   DATE OF PROCEDURE:  05/11/2005  DATE OF DISCHARGE:                                 OPERATIVE REPORT   PREOPERATIVE DIAGNOSIS:  Right knee lateral meniscal tear,   POSTOPERATIVE DIAGNOSIS:  Right knee lateral meniscal tear.   PROCEDURE:  Right knee arthroscopy with lateral meniscal debridement.   SURGEON:  Ollen Gross, M.D.   ASSISTANT:  None.   ANESTHESIA:  General.   ESTIMATED BLOOD LOSS:  Minimal   DRAINS:  None.   COMPLICATIONS:  None.   CONDITION:  Stable to recovery.   BRIEF CLINICAL NOTE:  [name] is a 77 year old female who has had a  previous right knee arthroscopy and lateral meniscal debridement several  years ago.  She did very well, but then had a fall back several months ago  with persistent lateral pain and mechanical symptoms.  She had nonoperative  management including injections and then had an MRI which did show recurrent  lateral meniscal tear.  She presents now for arthroscopy and debridement.   PROCEDURE IN DETAIL:  After successful administration of general anesthetic,  tourniquet was placed high on the right thigh and right lower extremity  prepped and draped in usual sterile fashion.  Standard superomedial inflow  lateral incision is made.  Inflow cannula passed superomedial, camera passed  inferolateral.  Arthroscopic visualization proceeds.  Undersurface of the  patella and trochlea looked fairly normal.  Medial and lateral gutters are  visualized.  There were no loose bodies.  Flexion and valgus force is  applied to the knee and the medial compartment entered.  The medial  compartment is normal.  Spinal needle is used to localize the inferomedial  portal, small incision made, dilator placed and  the intercondylar notch is  then visualized, ACL looks normal.  Lateral compartment is entered and she  has grade IV change on the lateral tibial plateau.  So most all exposed bone  on the lateral tibial plateau.  There is some grade II and III change in the  lateral femoral condyle.  The meniscus is torn at the body and posterior  horn.  This is subsequently debrided back to stable base with baskets and  4.2 mm shaver and then sealed with the ArthroCare device.  We again  inspected throughout the knee and did not see any loose bodies or other  areas of torn cartilage.  The arthroscopic equipment was then removed from  inferior portals which are closed  with interrupted 4-0 nylon.  We injected 20 mL of 0.25% Marcaine with epi  through the inflow cannula. Then that is removed and that portal closed with  nylon.  Bulky sterile dressing is applied.  She is awakened and transported  to recovery in stable condition.      Ollen Gross, M.D.  Electronically Signed     FA/MEDQ  D:  05/11/2005  T:  05/14/2005  Job:  [id number]

## 2010-06-30 NOTE — H&P (Signed)
[name], [name]                 ACCOUNT NO.:  [id number]   MEDICAL RECORD NO.:  [id number]          PATIENT TYPE:  INP   LOCATION:  1826                         FACILITY:  [facility]   PHYSICIAN:  Valetta Mole. Swords, M.D. [facility] OF BIRTH:  [birth year]   DATE OF ADMISSION:  07/01/2004  DATE OF DISCHARGE:                                HISTORY & PHYSICAL   CHIEF COMPLAINT:  Shortness of breath.   HISTORY OF PRESENT ILLNESS:  Jean Mitchell is a patient of Dr. Drue Novel who comes in  complaining of two to three day history of shortness of breath with  progressive lower extremity edema. She also complains of mild vague  bilateral chest discomfort, mostly on the lateral ribs. She denies any PND  or orthopnea. She has noticed some lower extremity edema. Shortness of  breath is worse with exertion. She denies any fevers or chills. She denies  any cough.   PAST MEDICAL HISTORY:  1.  Tobacco abuse.  2.  Cesarean section x2.  3.  Compression fractions, and she has undergone kyphoplasty.  4.  Hypertension.  5.  Depression.   CURRENT MEDICATIONS:  1.  Norvasc 10 mg p.o. q.d.  2.  Triamterene 1 p.o. q.d.  3.  Lexapro 10 mg p.o. q.d.  4.  Klonopin 0.5 mg p.o. q.h.s. p.r.n.  5.  Caltrate.   FAMILY HISTORY:  Noncontributory.   SOCIAL HISTORY:  She smokes one pack per day. She lives alone. Her son  checks on her frequently. She does not drink alcohol.   REVIEW OF SYSTEMS:  She denies any abdominal pain, change in bowel movements  or urinary habits, rashes, fevers, chills, or any other complaints in the  review of systems other than those listed above.   PHYSICAL EXAMINATION:  VITAL SIGNS:  Initial temperature 98.9, respirations  28, heart rate 102, blood pressure 121/70. Saturation initially 88%. Current  saturation 95%, temperature 98.6, heart rate 84, blood pressure 118/64,  respirations 24. At the time of examination, respirations were 16.  GENERAL:  She appears as an elderly female in no acute  distress.  HEENT:  Atraumatic and normocephalic. Extraocular muscles are intact.  NECK:  Supple without lymphadenopathy. No thyromegaly, jugular venous  distention, or carotid bruits.  CHEST:  Clear to auscultation without increased work of breathing. No  dullness to percussion. No fremitus.  CARDIAC:  S1 and S2 are normal without significant murmurs or gallops. PMI  is normal.  ABDOMEN:  Active bowel sounds, soft, nontender. There is no  hepatosplenomegaly. No masses are palpated.  EXTREMITIES:  There is 2+ pretibial edema.  DERMATOLOGIC:  No rashes.  BREAST/PELVIC:  Not performed.  NEUROLOGICAL:  She is alert and oriented without any motor or sensory  deficits. Sensation seems intact.   LABORATORY DATA:  CBC:  White count 9.2, hemoglobin 12.9, platelet count  376,000. Sodium 136, potassium, potassium 3.9, chloride 98, CO2 26, glucose  122, BUN 8, creatinine 0.8. LFTs are normal. D-dimer is elevated at 1.59.  BNP is normal at 90.   CT of the chest:  No PE. I have ordered  an EKG.   ASSESSMENT/PLAN:  Shortness of breath, lower extremity edema, chest  discomfort, unknown cause. Also abnormalities with elevated D-dimer. It is  worth noting a BNP is normal. Her symptoms are most consistent with  congestive heart failure. I have not seen an EKG yet but have ordered it. It  is unusual that her BNP would be normal with congestive heart failure. She  has had a pretty significant evaluation for PE with CAT scan. Her D-dimer is  elevated. No clot noted in her legs with PE protocol. It might be reasonable  to do an ultrasound of her legs as well. She does need to be evaluated given  her chest discomfort and shortness of breath. Will rule out for a myocardial  infarction. Will check an echocardiogram and check an EKG. Will empirically  treat with Lasix 40 mg. Will continue her other medications for the time  being. It is worth noting Norvasc could be the cause of her lower extremity  edema,  but I do not think it would cause her to be short of breath.       BHS/MEDQ  D:  07/01/2004  T:  07/01/2004  Job:  [id number]   cc:   Wanda Plump, MD [facility]  [phone] [place], [place] [address]

## 2010-06-30 NOTE — Consult Note (Signed)
NAMEBRENLY, TRAWICK                 ACCOUNT NO.:  [id number]   MEDICAL RECORD NO.:  [id number]          PATIENT TYPE:  OUT   LOCATION:  XRAY                         FACILITY:  [facility]   PHYSICIAN:  Sanjeev K. Deveshwar, M.D.DATE OF BIRTH:  1927/06/02   DATE OF CONSULTATION:  DATE OF DISCHARGE:                                   CONSULTATION   CHIEF COMPLAINT:  Compression fractures.   HISTORY OF PRESENT ILLNESS:  This is a 77 year old female followed by Dr.  Drue Novel.  On Good Friday, the patient was working in her bedroom removing some  objects from under her bed when she suddenly developed severe back pain.  She had an MRI performed on June 02, 2004, that showed multiple compression  fractures at the T8, L1 and L2 levels.  The L1 and T8 levels appeared to be  acute or subacute.  The patient has been seeing Dr. Drue Novel and has been  receiving medication for pain control, however, she continues to be in  severe pain.  She is currently taking Tylox one tablet q.4h.  She states  that even with the Tylox her pain is an 8 or 9 on a 1 to 10 scale.  She has  been very limited with her activity due to the pain.  She has also had  difficulty breathing, difficulty sleeping and had a decreased appetite.  She  has noted some lower extremity edema as well.  However, this may not be  related to her back pain.  She is here today for evaluation by Dr. Corliss Skains  for possible kyphoplasty or vertebroplasty.   PAST MEDICAL HISTORY:  1.  Negative for diabetes mellitus.  2.  She has never had a stroke.  3.  She denies COPD, although she has a previous history of tobacco use.  4.  She does have a history of osteoporosis.  5.  She has hypertension.  6.  She has hyperlipidemia.  7.  She tells me she had congestive heart failure in the past.  8.  She is status post tonsillectomy, hysterectomy, C-sections x2, status      post shoulder surgery for  rotator cuff repair as well as knee surgery.   ALLERGIES:  No known  drug allergies.   CURRENT MEDICATIONS:  1.  Norvasc 10 mg daily.  2.  Lexapro 10 mg at bedtime.  3.  Clonazepam 0.5 mg at bedtime.  4.  OxyContin and oxycodone p.r.n. for pain.   SOCIAL HISTORY:  The patient is separated.  She has two sons.  She lives in  [place], [place], alone.  She quit smoking several years ago.  She  has a long tobacco history.  She does not use alcohol.  She is retired from  [employer]. 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   FAMILY HISTORY:  Her mother died at age 29 from congestive heart failure.  Father died at age 89 from colon CA.   IMPRESSION:  1.  Multiple compression fractures appearing acute to subacute at levels T8,      L1 and L2.  2.  Severe back pain.  3.  Osteoporosis.  4.  Hyperlipidemia.  5.  History of remote congestive heart failure with recent lower extremity      edema.  6.  History of multiple surgeries as noted above.  7.  History of anxiety and depression.   PLAN:  The risks and benefits of kyphoplasty and vertebroplasty were  discussed with the patient, her son and her granddaughter in depth.  Questions were answered.  The patient feels that she wants to proceed with  the intervention and it has been tentatively scheduled for Monday, Jun 12, 2004, at 9 a.m.   The patient is due to see Dr. Drue Novel tomorrow.  We have asked her to have Dr.  Drue Novel evaluate her shortness of breath and lower extremity edema and to rule  out congestive heart failure.  Provided Dr. Drue Novel feels that the patient is  medically stable to proceed with the vertebroplasty and/or kyphoplasty.  We  will have her return on Monday for the intervention.  She will continue with  her pain medications in the interim.  She has been told to call us for any  problems or questions.      DR/MEDQ  D:  06/08/2004  T:  06/08/2004  Job:  [id number]   cc:   Ollen Gross, M.D.  [facility]  [address]  [address]  [place]  [place] [address]  Fax:  [phone]

## 2010-06-30 NOTE — Op Note (Signed)
NAME:  Jean Mitchell, Jean Mitchell                           ACCOUNT NO.:  [id number]   MEDICAL RECORD NO.:  [id number]                   PATIENT TYPE:  AMB   LOCATION:  ENDO                                 FACILITY:  [facility]   PHYSICIAN:  [name], M.D. [facility]               DATE OF BIRTH:  1928/02/08   DATE OF PROCEDURE:  12/21/2002  DATE OF DISCHARGE:                                 OPERATIVE REPORT   NAME OF PROCEDURE:  Upper endoscopy and esophageal dilation.   INDICATIONS:  This 77 year old white female has episodes of hematemesis.  She has had dysphagia to solids and pills.  She denies any heartburn.  The  patient was on Celebrex previously, but this was discontinued.  There has  been occasional cough, but no hoarseness.  The patient does not smoke, does  not drink alcohol.  She is undergoing upper endoscopy to evaluate her  hematemesis to rule out Mallory-Weiss tear.  The patient has been already on  Prevacid 30 mg a day x7 days.   ENDOSCOPE:  Olympus single chamber video endoscope.   SEDATION:  1. Versed 5 mg IV.  2. Fentanyl 60 mcg IV.   FINDINGS:  Olympus single-chamber video endoscope passed under direct vision  through the posterior pharynx and into the esophagus.  The patient was  monitored by pulse oximeter.  Her oxygen saturations were normal.  The  proximal and distal esophageal mucosa was unremarkable.  There was no  evidence of esophagitis.  The squamocolumnar junction was normal.  There was  normal excentric diaphragmatic hiatus with normal __________ esophageal  sphincter, no evidence of stricture or hiatal hernia.   STOMACH:  The stomach was insufflated with air, and there were normal rugal  folds.  The gastric antrum, pyloric outlet, retroflexion of endoscope  revealed normal fundus and cardia.   Duodenum, duodenal bulb, and descending duodenum was normal.  The endoscope  was then brought back through the stomach and again retroflexed.  No  evidence of a lesion in the  cardia.  Maloney dilator of 66 French passed  blindly through the esophagus without any resistance, there was no blood on  the dilator.   IMPRESSION:  Normal upper endoscopy of esophagus, stomach, and duodenum,  with no evidence of esophageal stricture, status post passage of a 54 [place]  Maloney dilator.    PLAN:  The patient's hematemesis was possibly related to a superficial  Mallory-Weiss tear which has healed up since the event occurred two weeks  ago.  We also did not see any stricture.  Her dysphagia to pills may be  related to esophageal dysmotility or presbyesophagus.  She is to continue  her Prevacid 30 mg a day for now.  Chew carefully.  If the dysphagia  worsens, an esophagram may be helpful to assess the motility.  [name], M.D. [facility]    DB/MEDQ  D:  12/21/2002  T:  12/21/2002  Job:  [id number]   cc:   Genene Churn. Love, M.D.  [address]  [address]  [place]  [place] [address]  Fax: [phone]   Corwin Levins, M.D. [facility]

## 2010-06-30 NOTE — Consult Note (Signed)
NAMEJONIECE, Jean Mitchell                 ACCOUNT NO.:  [id number]   MEDICAL RECORD NO.:  [id number]           PATIENT TYPE:   LOCATION:                                 FACILITY:   PHYSICIAN:  Deveshwar, M.D.        DATE OF BIRTH:  18-Nov-1927   DATE OF CONSULTATION:  06/08/2004  DATE OF DISCHARGE:                                   CONSULTATION   ADDENDUM:  It should be noted that approximately 40 minutes was spent on  this consult.      Markus.Osmond   DR/MEDQ  D:  08/21/2004  T:  08/21/2004  Job:  [id number]

## 2010-06-30 NOTE — Op Note (Signed)
NAME:  Jean Mitchell, Jean Mitchell                           ACCOUNT NO.:  [id number]   MEDICAL RECORD NO.:  [id number]                   PATIENT TYPE:  OIB   LOCATION:  2899                                 FACILITY:  [facility]   PHYSICIAN:  Robert L. Dione Booze, M.D.               DATE OF BIRTH:  1927-05-19   DATE OF PROCEDURE:  09/09/2003  DATE OF DISCHARGE:                                 OPERATIVE REPORT   INDICATIONS AND JUSTIFICATIONS FOR THE PROCEDURE:  This lady has been  followed in my office for several years dating back to 1997 and has had  previous cataract surgery with lens implants.  She has always had some  redundant skin of each upper eyelid but was seen recently on August 18, 2003,  complaining that this was blocking her peripheral vision and she could see  better when she lifted the skin up.  The skin felt heavy and made her feel  tired.  Indeed, this was confirmed with photographs and performing visual  fields.  Visual field test shows that she looses approximately 15-20 degrees  of each superior field when the skin is taped compared to when it is not  taped.  The reflex distance is about 2 mm.  The pressure is 10 in each eye.  The vision is 20/20.  The motility, pupils, conjunctiva, cornea, anterior  chamber, and fundus in each eye were negative.  On the slit lamp, she does  have lens implants.  The skin actually droops over the lashes and comes  close to the pupil.  She decided to have upper eyelid blepharoplasties in  order to improve her symptoms.  Medically, she should be stable for this.   Justification for performing the procedure in an outpatient setting is  routine.   Justification for an overnight stay is none.   PREOPERATIVE DIAGNOSIS:  Dermatochalasis with visual impairment.   POSTOPERATIVE DIAGNOSIS:  Dermatochalasis with visual impairment.   OPERATION PERFORMED:  Upper eyelid blepharoplasties.   SURGEON:  Robert L. Dione Booze, M.D.   ANESTHESIA:  1% Xylocaine with  epinephrine.   PROCEDURE:  The patient arrived in the minor surgery room and was prepped  and draped in the routine fashion.  An injection of 1% Xylocaine with  epinephrine was given to the skin of each upper eyelid and the skin to be  removed was carefully demarcated and then excised.  Some underlying fatty  tissue was excised and bleeding was controlled with pressure and cautery.  Each wound was then closed with a running 6-0 nylon suture and cold  compresses were applied.  The patient then left the minor operating room  having done well.   FOLLOW UP:  The patient is to use cold compresses today and warm compresses  starting tomorrow.  She is to keep her head elevated today.  She is to use  Polysporin ointment in her eyes at night.  She  is to call my office to have  the sutures removed in about 5-6 days.                                               Robert L. Dione Booze, M.D.    RLG/MEDQ  D:  09/09/2003  T:  09/09/2003  Job:  [id number]

## 2010-07-03 ENCOUNTER — Other Ambulatory Visit: Payer: Self-pay | Admitting: *Deleted

## 2010-07-03 DIAGNOSIS — E875 Hyperkalemia: Secondary | ICD-10-CM

## 2010-07-06 ENCOUNTER — Other Ambulatory Visit (INDEPENDENT_AMBULATORY_CARE_PROVIDER_SITE_OTHER): Payer: Medicare Other | Admitting: *Deleted

## 2010-07-06 ENCOUNTER — Encounter (HOSPITAL_BASED_OUTPATIENT_CLINIC_OR_DEPARTMENT_OTHER): Payer: Medicare Other | Admitting: Oncology

## 2010-07-06 DIAGNOSIS — Z170 Estrogen receptor positive status [ER+]: Secondary | ICD-10-CM

## 2010-07-06 DIAGNOSIS — J449 Chronic obstructive pulmonary disease, unspecified: Secondary | ICD-10-CM

## 2010-07-06 DIAGNOSIS — C211 Malignant neoplasm of anal canal: Secondary | ICD-10-CM

## 2010-07-06 DIAGNOSIS — E875 Hyperkalemia: Secondary | ICD-10-CM

## 2010-07-06 DIAGNOSIS — C50419 Malignant neoplasm of upper-outer quadrant of unspecified female breast: Secondary | ICD-10-CM

## 2010-07-06 LAB — BASIC METABOLIC PANEL
Calcium: 9.4 mg/dL (ref 8.4–10.5)
Creatinine, Ser: 0.8 mg/dL (ref 0.4–1.2)
GFR: 78.84 mL/min (ref >60.00)
Sodium: 139 mEq/L (ref 135–145)

## 2010-07-07 ENCOUNTER — Telehealth: Payer: Self-pay | Admitting: Cardiology

## 2010-07-07 NOTE — Telephone Encounter (Signed)
Pt calling re blood work results. Pt wants to talk to a nurse.

## 2010-07-07 NOTE — Telephone Encounter (Signed)
Pt aware of 07/06/10 BMP results by phone.

## 2010-07-26 ENCOUNTER — Encounter: Payer: Self-pay | Admitting: Internal Medicine

## 2010-07-28 ENCOUNTER — Encounter: Payer: Self-pay | Admitting: Internal Medicine

## 2010-07-28 ENCOUNTER — Ambulatory Visit (INDEPENDENT_AMBULATORY_CARE_PROVIDER_SITE_OTHER): Payer: Medicare Other | Admitting: Internal Medicine

## 2010-07-28 DIAGNOSIS — L989 Disorder of the skin and subcutaneous tissue, unspecified: Secondary | ICD-10-CM | POA: Insufficient documentation

## 2010-07-28 DIAGNOSIS — I509 Heart failure, unspecified: Secondary | ICD-10-CM

## 2010-07-28 DIAGNOSIS — J449 Chronic obstructive pulmonary disease, unspecified: Secondary | ICD-10-CM

## 2010-07-28 DIAGNOSIS — F329 Major depressive disorder, single episode, unspecified: Secondary | ICD-10-CM

## 2010-07-28 NOTE — Assessment & Plan Note (Signed)
Stable on Lasix 3 times a week

## 2010-07-28 NOTE — Assessment & Plan Note (Signed)
Well controlled with current medications.

## 2010-07-28 NOTE — Assessment & Plan Note (Signed)
Skin lesions at the right ear, BCC? Refer to dermatology

## 2010-07-28 NOTE — Assessment & Plan Note (Signed)
Well controlled on present regimen

## 2010-07-28 NOTE — Progress Notes (Signed)
  Subjective:    Patient ID: Jean Mitchell, female    DOB: [birth year], 77 y.o.   MRN: [id number]  HPI Routine office visit, doing well. She recently saw cardiology, she was stable. Potassium was a slightly elevated, repeated K was within normal  Past Medical History  Diagnosis Date  . Breast cancer 2007    s/p XRT on Tamoxifen  . Hyperlipidemia   . CHF (congestive heart failure)   . COPD (chronic obstructive pulmonary disease)     o2 at night  . Depression   . AAA (abdominal aortic aneurysm)   . Back pain     lumbar, chronic  . Hypertension   . Osteoporosis     dexa 02-18-08  . Insomnia   . Esophageal stricture   . H/O: hysterectomy   . Hip fracture 08/2009    R, s/p ORIF   Past Surgical History  Procedure Date  . Oophorectomy     unilateral  . Knee arthroscopy     right   . Hemorrhoid surgery   . Left partrial mastectomy   . Abdominal hysterectomy      Review of Systems COPD well controlled, good  compliance with Advair, uses the nebulizer about once daily and oxygen at night. Depression well controlled with current treatment. Has a place (scab?) In the right ear that likes me to check. First noticed 3 months ago      Objective:   Physical Exam Alert, oriented, in no apparent distress. Lungs: Clear to auscultation, no wheezing or rhonchi. Cardiovascular: Regular with and rhythm Skin right ear, has a 3 mm superficial ulcer with slightly prominent borders.        Assessment & Plan:

## 2010-08-01 ENCOUNTER — Encounter: Payer: Self-pay | Admitting: Internal Medicine

## 2010-08-01 LAB — HM MAMMOGRAPHY

## 2010-08-02 ENCOUNTER — Other Ambulatory Visit: Payer: Self-pay | Admitting: *Deleted

## 2010-08-02 MED ORDER — CITALOPRAM HYDROBROMIDE 20 MG PO TABS: ORAL_TABLET | ORAL | Status: DC

## 2010-08-02 MED ORDER — PRAVASTATIN SODIUM 40 MG PO TABS: 40.0000 mg | ORAL_TABLET | Freq: Every day | ORAL | Status: DC

## 2010-08-09 ENCOUNTER — Other Ambulatory Visit: Payer: Self-pay | Admitting: Dermatology

## 2010-08-14 ENCOUNTER — Other Ambulatory Visit: Payer: Self-pay | Admitting: *Deleted

## 2010-08-14 ENCOUNTER — Encounter (INDEPENDENT_AMBULATORY_CARE_PROVIDER_SITE_OTHER): Payer: Medicare Other | Admitting: Cardiology

## 2010-08-14 DIAGNOSIS — I714 Abdominal aortic aneurysm, without rupture: Secondary | ICD-10-CM

## 2010-08-14 DIAGNOSIS — I700 Atherosclerosis of aorta: Secondary | ICD-10-CM

## 2010-08-14 NOTE — Telephone Encounter (Signed)
Denied, got 1 RF 06-2010, has enough until mid July

## 2010-08-18 MED ORDER — HYDROCODONE-ACETAMINOPHEN 10-325 MG PO TABS: 1.0000 | ORAL_TABLET | Freq: Four times a day (QID) | ORAL | Status: DC | PRN

## 2010-08-18 NOTE — Telephone Encounter (Signed)
Addended by: Army Fossa R on: 08/18/2010 04:56 PM   Modules accepted: Orders

## 2010-08-18 NOTE — Telephone Encounter (Signed)
Left detailed message that rx was sent

## 2010-08-18 NOTE — Telephone Encounter (Signed)
Patient called to check status of pain prescription--told her about message that it was too early---she says she has had problem with ear (saw Dr Elsie Amis for skin cancer--result=benign) but, due to pain from ear, has been taking pain pills  ---Dr Terri Piedra offered her prescription for pain pills, but she said she already had pills at home--now she is out--says she normally doesn't run out but had to take more recently due to painful ear---can she get a refill???

## 2010-08-18 NOTE — Telephone Encounter (Signed)
Dr.Paz please advise  

## 2010-08-18 NOTE — Telephone Encounter (Signed)
agree

## 2010-08-18 NOTE — Telephone Encounter (Signed)
Per Dr.Paz send in 120 no refills.

## 2010-08-21 ENCOUNTER — Encounter: Payer: Self-pay | Admitting: Cardiology

## 2010-08-22 ENCOUNTER — Telehealth: Payer: Self-pay | Admitting: Cardiology

## 2010-08-22 NOTE — Telephone Encounter (Signed)
Patient aware of abdominal aortic Aneurysm Duplex results and MD recommendations. She verbalized understanding.

## 2010-08-22 NOTE — Telephone Encounter (Signed)
Patient returning call to [facility] from yesterday.

## 2010-09-15 ENCOUNTER — Telehealth: Payer: Self-pay | Admitting: Internal Medicine

## 2010-09-15 MED ORDER — HYDROCODONE-ACETAMINOPHEN 10-325 MG PO TABS: 1.0000 | ORAL_TABLET | Freq: Four times a day (QID) | ORAL | Status: DC | PRN

## 2010-09-15 NOTE — Telephone Encounter (Signed)
#  60 ; 1 every 6 hrs PRN ONLY

## 2010-09-15 NOTE — Telephone Encounter (Signed)
Last refilled 08/18/10 #120, no refills. Last OV- 07/28/10.

## 2010-09-19 ENCOUNTER — Telehealth: Payer: Self-pay | Admitting: *Deleted

## 2010-09-19 NOTE — Telephone Encounter (Signed)
Pt states that she refill on pain meds. It looks like it was faxed to the pharmacy on 09/15/10. Left message for pt to call back.

## 2010-10-03 ENCOUNTER — Other Ambulatory Visit: Payer: Self-pay | Admitting: Internal Medicine

## 2010-10-03 MED ORDER — HYDROCODONE-ACETAMINOPHEN 10-325 MG PO TABS: 1.0000 | ORAL_TABLET | Freq: Four times a day (QID) | ORAL | Status: DC | PRN

## 2010-10-03 NOTE — Telephone Encounter (Signed)
120, no RF

## 2010-10-03 NOTE — Telephone Encounter (Signed)
Hydrocodone-APAP request [last refill 09/15/10 #60x0]

## 2010-10-03 NOTE — Telephone Encounter (Signed)
Rx Done . 

## 2010-10-06 ENCOUNTER — Other Ambulatory Visit: Payer: Self-pay | Admitting: Internal Medicine

## 2010-10-06 MED ORDER — FLUTICASONE-SALMETEROL 100-50 MCG/DOSE IN AEPB: 1.0000 | INHALATION_SPRAY | Freq: Two times a day (BID) | RESPIRATORY_TRACT | Status: DC

## 2010-10-06 MED ORDER — FLUTICASONE PROPIONATE 50 MCG/ACT NA SUSP: 2.0000 | Freq: Every day | NASAL | Status: DC

## 2010-10-06 NOTE — Telephone Encounter (Signed)
Rx [2] Done. 

## 2010-11-01 ENCOUNTER — Other Ambulatory Visit: Payer: Self-pay | Admitting: Internal Medicine

## 2010-11-01 MED ORDER — HYDROCODONE-ACETAMINOPHEN 10-325 MG PO TABS: 1.0000 | ORAL_TABLET | Freq: Four times a day (QID) | ORAL | Status: DC | PRN

## 2010-11-01 NOTE — Telephone Encounter (Signed)
120, no RF 

## 2010-11-01 NOTE — Telephone Encounter (Signed)
Notified pharmacy spoke with Shanda Bumps ok # 120 only. Updated EPIC.Marland KitchenMarland Kitchen9/19/12@2 :34pm/LMB

## 2010-11-06 LAB — BASIC METABOLIC PANEL
BUN: 30 — ABNORMAL HIGH
BUN: 6
CO2: 22
CO2: 27
CO2: 33 — ABNORMAL HIGH
Calcium: 7.2 — ABNORMAL LOW
Calcium: 8 — ABNORMAL LOW
Calcium: 8.4
Calcium: 8.6
Chloride: 102
Chloride: 108
Creatinine, Ser: 0.54
Creatinine, Ser: 0.62
GFR calc Af Amer: >60
GFR calc Af Amer: >60
GFR calc Af Amer: >60
GFR calc Af Amer: >60
GFR calc non Af Amer: 41 — ABNORMAL LOW
GFR calc non Af Amer: >60
Glucose, Bld: 92
Potassium: 3.3 — ABNORMAL LOW
Potassium: 4
Sodium: 130 — ABNORMAL LOW
Sodium: 133 — ABNORMAL LOW
Sodium: 139

## 2010-11-06 LAB — POCT I-STAT 3, ART BLOOD GAS (G3+)
Bicarbonate: 22.5
Patient temperature: 98.1
TCO2: 23
pH, Arterial: 7.455 — ABNORMAL HIGH

## 2010-11-06 LAB — CBC
HCT: 24.4 — ABNORMAL LOW
HCT: 25.8 — ABNORMAL LOW
HCT: 26.3 — ABNORMAL LOW
Hemoglobin: 8.1 — ABNORMAL LOW
Hemoglobin: 8.7 — ABNORMAL LOW
Hemoglobin: 8.9 — ABNORMAL LOW
MCHC: 33.5
MCHC: 33.8
MCV: 96.9
Platelets: 163
Platelets: 310
RBC: 2.5 — ABNORMAL LOW
RBC: 2.67 — ABNORMAL LOW
RBC: 2.69 — ABNORMAL LOW
RDW: 13.2
RDW: 13.8
WBC: 9.3
WBC: 7.6

## 2010-11-06 LAB — CULTURE, BLOOD (ROUTINE X 2): Culture: NO GROWTH

## 2010-11-06 LAB — URINALYSIS, ROUTINE W REFLEX MICROSCOPIC
Glucose, UA: NEGATIVE
Hgb urine dipstick: NEGATIVE
Ketones, ur: 15 — AB
Protein, ur: 30 — AB
Urobilinogen, UA: 0.2

## 2010-11-06 LAB — CROSSMATCH: Antibody Screen: NEGATIVE

## 2010-11-06 LAB — DIFFERENTIAL
Basophils Absolute: 0
Basophils Absolute: 0
Eosinophils Absolute: 0
Lymphocytes Relative: 5 — ABNORMAL LOW
Lymphocytes Relative: 8 — ABNORMAL LOW
Lymphs Abs: 0.6 — ABNORMAL LOW
Neutro Abs: 8 — ABNORMAL HIGH
Neutrophils Relative %: 94 — ABNORMAL HIGH
Neutrophils Relative %: 81 — ABNORMAL HIGH

## 2010-11-06 LAB — ABO/RH: ABO/RH(D): O POS

## 2010-11-06 LAB — URINE MICROSCOPIC-ADD ON

## 2010-11-06 LAB — EXPECTORATED SPUTUM ASSESSMENT W GRAM STAIN, RFLX TO RESP C

## 2010-11-06 LAB — VITAMIN B12: Vitamin B-12: 864 (ref 211–911)

## 2010-11-06 LAB — LACTIC ACID, PLASMA: Lactic Acid, Venous: 1.3

## 2010-11-06 LAB — LEGIONELLA ANTIGEN, URINE: Legionella Antigen, Urine: NEGATIVE

## 2010-11-06 LAB — COMPREHENSIVE METABOLIC PANEL
Albumin: 2.2 — ABNORMAL LOW
Alkaline Phosphatase: 39
BUN: 12
Chloride: 108
Glucose, Bld: 102 — ABNORMAL HIGH
Potassium: 3.5
Total Bilirubin: 0.3

## 2010-11-06 LAB — PROTIME-INR
INR: 1
INR: 1.3
Prothrombin Time: 13.5
Prothrombin Time: 16.6 — ABNORMAL HIGH

## 2010-11-06 LAB — CULTURE, RESPIRATORY W GRAM STAIN

## 2010-11-06 LAB — IRON AND TIBC
Iron: 15 — ABNORMAL LOW
TIBC: 171 — ABNORMAL LOW
UIBC: 156

## 2010-11-06 LAB — FOLATE: Folate: 12.8

## 2010-11-06 LAB — CARBOXYHEMOGLOBIN: Total oxygen content: 2 — ABNORMAL LOW

## 2010-11-06 LAB — STREP PNEUMONIAE URINARY ANTIGEN: Strep Pneumo Urinary Antigen: NEGATIVE

## 2010-11-09 ENCOUNTER — Other Ambulatory Visit: Payer: Self-pay | Admitting: Internal Medicine

## 2010-11-09 NOTE — Telephone Encounter (Signed)
Chlordiazepoxide request [last refill #360x3 04/28/2010]

## 2010-11-10 MED ORDER — CHLORDIAZEPOXIDE HCL 5 MG PO CAPS: ORAL_CAPSULE | ORAL | Status: DC

## 2010-11-10 NOTE — Telephone Encounter (Signed)
RX manually faxed  

## 2010-11-10 NOTE — Telephone Encounter (Signed)
Ok  #360, no RF (90 day supply)

## 2010-11-23 ENCOUNTER — Other Ambulatory Visit: Payer: Self-pay | Admitting: Internal Medicine

## 2010-11-23 MED ORDER — PRAVASTATIN SODIUM 40 MG PO TABS: 40.0000 mg | ORAL_TABLET | Freq: Every day | ORAL | Status: DC

## 2010-11-28 ENCOUNTER — Telehealth: Payer: Self-pay | Admitting: Internal Medicine

## 2010-11-28 ENCOUNTER — Other Ambulatory Visit: Payer: Self-pay | Admitting: Internal Medicine

## 2010-11-28 MED ORDER — CITALOPRAM HYDROBROMIDE 20 MG PO TABS: ORAL_TABLET | ORAL | Status: DC

## 2010-11-28 MED ORDER — HYDROCODONE-ACETAMINOPHEN 10-325 MG PO TABS: 1.0000 | ORAL_TABLET | Freq: Four times a day (QID) | ORAL | Status: DC | PRN

## 2010-11-28 NOTE — Telephone Encounter (Signed)
Faxed to pharmacy

## 2010-11-28 NOTE — Telephone Encounter (Signed)
Didn't  we refilled her pain meds  11/23/2010 ?

## 2010-11-28 NOTE — Telephone Encounter (Signed)
The pt called and is requesting to switch from Dr.Paz to [facility].  Can she do this?

## 2010-11-28 NOTE — Telephone Encounter (Signed)
Celexa request [last refill 08/02/10 #45x3] Hydrocodone -APAP request [last refill 11/01/10 #120x0]

## 2010-11-28 NOTE — Telephone Encounter (Signed)
Pharmacy didn't received rx request medication resent to pharmacy

## 2010-11-29 NOTE — Telephone Encounter (Signed)
Ok with me, we need to ask Dr Laury Axon also

## 2010-12-02 ENCOUNTER — Ambulatory Visit (INDEPENDENT_AMBULATORY_CARE_PROVIDER_SITE_OTHER): Payer: Medicare Other | Admitting: Family Medicine

## 2010-12-02 ENCOUNTER — Other Ambulatory Visit: Payer: Self-pay | Admitting: Family Medicine

## 2010-12-02 ENCOUNTER — Ambulatory Visit (HOSPITAL_COMMUNITY): Admission: RE | Admit: 2010-12-02 | Payer: Medicare Other | Source: Ambulatory Visit

## 2010-12-02 ENCOUNTER — Ambulatory Visit (HOSPITAL_COMMUNITY)
Admission: RE | Admit: 2010-12-02 | Discharge: 2010-12-02 | Disposition: A | Discharge status: Home / Self Care | Payer: Medicare Other | Source: Ambulatory Visit | Attending: Family Medicine | Admitting: Family Medicine

## 2010-12-02 VITALS — BP 120/70 | HR 73 | Temp 98.7°F | Wt 108.0 lb

## 2010-12-02 DIAGNOSIS — R0789 Other chest pain: Secondary | ICD-10-CM | POA: Insufficient documentation

## 2010-12-02 DIAGNOSIS — R071 Chest pain on breathing: Secondary | ICD-10-CM

## 2010-12-02 DIAGNOSIS — S22009A Unspecified fracture of unspecified thoracic vertebra, initial encounter for closed fracture: Secondary | ICD-10-CM | POA: Insufficient documentation

## 2010-12-02 DIAGNOSIS — X58XXXA Exposure to other specified factors, initial encounter: Secondary | ICD-10-CM | POA: Insufficient documentation

## 2010-12-02 DIAGNOSIS — J449 Chronic obstructive pulmonary disease, unspecified: Secondary | ICD-10-CM

## 2010-12-02 NOTE — Progress Notes (Signed)
Subjective:    Patient ID: Jean Mitchell, female    DOB: 05-24-1927, 77 y.o.   MRN: [id number]  HPI Here for side pain  For the last few months has been having cramps in her side  This time just her R side hurts -- starting this am  Worse with inspiration - and radiates over to the middle of the chest  Some cough-mild with yellow mucous  No uri lately ,but Uses 02 at night - prior smoker with copd    Pulse  ox id 93 off her 02 right now  No fever  No known sick contacts - but hard to tell   Is up to date on pneumovax   Cancer of breast and rectum in the past  No recurrence known  Patient Active Problem List  Diagnoses  . ANAL CANCER  . HYPERLIPIDEMIA  . ANEMIA, IRON DEFICIENCY  . DEPRESSION  . HYPERTENSION, BENIGN  . CHF  . AAA  . VENOUS INSUFFICIENCY  . COPD  . ESOPHAGEAL STRICTURE  . BACK PAIN, LUMBAR, CHRONIC  . OSTEOPOROSIS NOS  . INSOMNIA  . FATIGUE  . WEIGHT LOSS  . COLONOSCOPY, HX OF  . HEMORRHOIDECTOMY, HX OF  . Aortic stenosis  . Skin lesion  . Chest wall pain   Past Medical History  Diagnosis Date  . Breast cancer 2007    s/p XRT on Tamoxifen  . Hyperlipidemia   . CHF (congestive heart failure)   . COPD (chronic obstructive pulmonary disease)     o2 at night  . Depression   . AAA (abdominal aortic aneurysm)   . Back pain     lumbar, chronic  . Hypertension   . Osteoporosis     dexa 02-18-08  . Insomnia   . Esophageal stricture   . H/O: hysterectomy   . Hip fracture 08/2009    R, s/p ORIF   Past Surgical History  Procedure Date  . Oophorectomy     unilateral  . Knee arthroscopy     right   . Hemorrhoid surgery   . Left partrial mastectomy   . Abdominal hysterectomy    History  Substance Use Topics  . Smoking status: Former Smoker    Quit date: 02/13/2004  . Smokeless tobacco: Not on file  . Alcohol Use: Not on file   Family History  Problem Relation Age of Onset  . Heart disease Mother   . Cancer Mother     bladder  .  Cancer Father     colon  . Heart disease Sister   . Cancer Sister     breast   Allergies  Allergen Reactions  . Ezetimibe-Simvastatin     REACTION: unspecified  . Gabapentin     REACTION: severe reaction, whelps   Current Outpatient Prescriptions on File Prior to Visit  Medication Sig Dispense Refill  . albuterol (PROVENTIL) (2.5 MG/3ML) 0.083% nebulizer solution Take 3 mLs (2.5 mg total) by nebulization 4 (four) times daily.  1080 mL  0  . chlordiazePOXIDE (LIBRIUM) 5 MG capsule 1 in the morning, 1 in the afternoon and 2 at bedtime  360 capsule  0  . citalopram (CELEXA) 20 MG tablet 1.5 tablets daily  45 tablet  3  . fluticasone (FLONASE) 50 MCG/ACT nasal spray Place 2 sprays into the nose daily.  16 g  2  . Fluticasone-Salmeterol (ADVAIR DISKUS) 100-50 MCG/DOSE AEPB Inhale 1 puff into the lungs every 12 (twelve) hours.  60 each  2  .  furosemide (LASIX) 20 MG tablet Take 20 mg by mouth. 3 times a week      . HYDROcodone-acetaminophen (NORCO) 10-325 MG per tablet Take 1 tablet by mouth every 6 (six) hours as needed.  120 tablet  1  . ibuprofen (ADVIL,MOTRIN) 200 MG tablet Take 200 mg by mouth 2 (two) times daily.        . pravastatin (PRAVACHOL) 40 MG tablet Take 1 tablet (40 mg total) by mouth daily.  30 tablet  3  . vitamin B-12 (CYANOCOBALAMIN) 100 MCG tablet Take 50 mcg by mouth daily.        Marland Kitchen VITAMIN D, CHOLECALCIFEROL, PO Take by mouth.                Review of Systems Review of Systems  Constitutional: Negative for fever, appetite change, fatigue and unexpected weight change.  Eyes: Negative for pain and visual disturbance.  Respiratory: pos for baseline cough and sob, neg for wheezing today  Cardiovascular: Negative for exertional cp or palpitations or racing heart    Gastrointestinal: Negative for nausea, diarrhea and constipation.  Genitourinary: Negative for urgency and frequency.  Skin: Negative for pallor or rash   Neurological: Negative for weakness,  light-headedness, numbness and headaches.  Hematological: Negative for adenopathy. Does not bruise/bleed easily.  Psychiatric/Behavioral: Negative for dysphoric mood. The patient is not nervous/anxious.          Objective:   Physical Exam  Constitutional: She appears well-developed and well-nourished. No distress.       Frail appearing elderly female  HENT:  Head: Normocephalic and atraumatic.  Right Ear: External ear normal.  Left Ear: External ear normal.  Nose: Nose normal.  Mouth/Throat: Oropharynx is clear and moist.       Some clear post nasal drip   Eyes: Conjunctivae and EOM are normal. Pupils are equal, round, and reactive to light. Right eye exhibits no discharge. Left eye exhibits no discharge. No scleral icterus.  Neck: Normal range of motion. Neck supple. Carotid bruit is not present. No tracheal deviation present. No thyromegaly present.  Cardiovascular: Normal rate, regular rhythm and normal heart sounds.   Pulmonary/Chest: Effort normal. No respiratory distress. She has no wheezes. She exhibits tenderness.       Diffusely distant bs in all areas  No rales or rhonchi Wheeze only on forced exp   Tender ribs ant lat R side No crepitice or skin change No soft tissue tenderness under ribs No CVA tenderness   Abdominal: Soft. Bowel sounds are normal. She exhibits no distension and no mass. There is no tenderness. There is no rebound and no guarding.  Musculoskeletal: Normal range of motion. She exhibits no edema.  Lymphadenopathy:    She has no cervical adenopathy.  Neurological: She is alert. She has normal reflexes. She exhibits normal muscle tone.  Skin: Skin is warm and dry. No rash noted. No erythema. No pallor.  Psychiatric: She has a normal mood and affect.          Assessment & Plan:

## 2010-12-02 NOTE — Patient Instructions (Addendum)
You can take your norco every 4-6 hours as needed for pain (you may need a stool softener for constipation with this )  Drink lots of fluids  Use warm compress on chest as needed for pain  Please go across the street to [facility] for a chest xray and I will call you with results and plan  Call or follow up if worse pain/ fever/ shortness of breath or change in symptoms

## 2010-12-03 ENCOUNTER — Telehealth: Payer: Self-pay | Admitting: Family Medicine

## 2010-12-03 NOTE — Telephone Encounter (Signed)
Called pt at home number to see how she is feeling -- no answer Left message to call back if no improvement or any concerns

## 2010-12-03 NOTE — Assessment & Plan Note (Signed)
R sided chest wall pain with rib tenderness in pt with copd (with cough) , and also s/p several cancers  No fever /sob or other symptoms and no trauma  Suspect costocondritis but need to r/o infiltrate or bony change Sent for chest xray at [facility] and pend report  Adv she could inc frequency of her pain med to Q4-6 hours as needed (with caution of sedation and stool softener if needed)  Will update her with result  inst her to call / follow up if worse pain or any sob/ racing heart or fever or other symptoms

## 2010-12-29 ENCOUNTER — Encounter: Payer: Self-pay | Admitting: Internal Medicine

## 2010-12-29 ENCOUNTER — Ambulatory Visit (INDEPENDENT_AMBULATORY_CARE_PROVIDER_SITE_OTHER): Payer: Medicare Other | Admitting: Internal Medicine

## 2010-12-29 DIAGNOSIS — R5381 Other malaise: Secondary | ICD-10-CM

## 2010-12-29 DIAGNOSIS — R634 Abnormal weight loss: Secondary | ICD-10-CM

## 2010-12-29 DIAGNOSIS — F329 Major depressive disorder, single episode, unspecified: Secondary | ICD-10-CM

## 2010-12-29 DIAGNOSIS — J4489 Other specified chronic obstructive pulmonary disease: Secondary | ICD-10-CM

## 2010-12-29 DIAGNOSIS — I509 Heart failure, unspecified: Secondary | ICD-10-CM

## 2010-12-29 DIAGNOSIS — M810 Age-related osteoporosis without current pathological fracture: Secondary | ICD-10-CM

## 2010-12-29 DIAGNOSIS — R5383 Other fatigue: Secondary | ICD-10-CM

## 2010-12-29 DIAGNOSIS — D509 Iron deficiency anemia, unspecified: Secondary | ICD-10-CM

## 2010-12-29 DIAGNOSIS — J449 Chronic obstructive pulmonary disease, unspecified: Secondary | ICD-10-CM

## 2010-12-29 DIAGNOSIS — F3289 Other specified depressive episodes: Secondary | ICD-10-CM

## 2010-12-29 LAB — CBC WITH DIFFERENTIAL/PLATELET
Basophils Relative: 0.3 % (ref 0.0–3.0)
Eosinophils Relative: 0.5 % (ref 0.0–5.0)
HCT: 35.9 % — ABNORMAL LOW (ref 36.0–46.0)
Lymphs Abs: 1.1 10*3/uL (ref 0.7–4.0)
MCV: 99.6 fl (ref 78.0–100.0)
Monocytes Absolute: 0.6 10*3/uL (ref 0.1–1.0)
RBC: 3.6 Mil/uL — ABNORMAL LOW (ref 3.87–5.11)
WBC: 4.7 10*3/uL (ref 4.5–10.5)

## 2010-12-29 LAB — BASIC METABOLIC PANEL
BUN: 11 mg/dL (ref 6–23)
Chloride: 101 mEq/L (ref 96–112)
Potassium: 4.1 mEq/L (ref 3.5–5.1)

## 2010-12-29 NOTE — Assessment & Plan Note (Addendum)
DEXA: 02-2008 Had a reclast 09-2009 States she's not interested on more Reclast (states "I know is bad for you"), due to esophageal issues she cannot take oral biphosphonates. We discussed possible prolia or Forteo and she is not really interested. Eventually she agreed to do a bone density test and see where she is at

## 2010-12-29 NOTE — Assessment & Plan Note (Signed)
Well controlled, counseled (see HPI)

## 2010-12-29 NOTE — Assessment & Plan Note (Signed)
Labs

## 2010-12-29 NOTE — Assessment & Plan Note (Signed)
Seems to be doing well, had a flu shot already. i reviewed w/ her appropriate use of rescue inhalers

## 2010-12-29 NOTE — Assessment & Plan Note (Addendum)
States she " feels bad and tired most days", I'm concerned about that statement (although since I know her she hardly ever said she feels well) so I reviewed her  meds and labs, ROS is essentially ok; saw oncology few months ago and was in remission. Everything seems ok, will observe for now.

## 2010-12-29 NOTE — Assessment & Plan Note (Signed)
Lost 4 pounds since last vist, will continue monitoring

## 2010-12-29 NOTE — Assessment & Plan Note (Signed)
Seems  well-controlled, labs 

## 2010-12-29 NOTE — Progress Notes (Signed)
  Subjective:    Patient ID: Jean Mitchell, female    DOB: 03-05-1927, 77 y.o.   MRN: [id number]  HPI Routine office visit "I feel tired and bad all the time" Also complains of aches and pains in the shoulders and back, taking pain medication as prescribed, less than 4 times a day. She is helping 67+-year-old lady who is in hospice, she feels that is too much for her and wonders if she should stop. I recommend her to stop if it is making her tired.  Past Medical History  Diagnosis Date  . Breast cancer 2007    s/p XRT on Tamoxifen  . Hyperlipidemia   . CHF (congestive heart failure)   . COPD (chronic obstructive pulmonary disease)     o2 at night  . Depression   . AAA (abdominal aortic aneurysm)   . Back pain     lumbar, chronic  . Hypertension   . Osteoporosis     dexa 02-18-08  . Insomnia   . Esophageal stricture   . H/O: hysterectomy   . Hip fracture 08/2009    R, s/p ORIF   History   Social History  . Marital Status: Divorced    Spouse Name: N/A    Number of Children: 2  . Years of Education: N/A   Occupational History  . retired    Social History Main Topics  . Smoking status: Former Smoker    Quit date: 02/13/2004  . Smokeless tobacco: Never Used  . Alcohol Use: No  . Drug Use: No  . Sexually Active: Not on file   Other Topics Concern  . Not on file   Social History Narrative   Lives by herself--son brings food, limited ADL since 7/11--- still drives     Review of Systems Depression reportedly well-controlled with citalopram. No suicidal ideas No fever or chills No headache but sometimes has neck pain early in the mornings, goes away as the day goes by, no radiation, no paresthesias. Her appetite is good, reports good by mouth tolerance. Weight has decreased 4 pounds in the last few months. No nausea, vomiting, diarrhea no blood in the stools. Denies any swelling or redness in the hands or wrists.    Objective:   Physical Exam  Constitutional: She  is oriented to person, place, and time. She appears well-developed. No distress.       Slightly underwt appearing  HENT:  Head: Normocephalic and atraumatic.  Cardiovascular: Normal rate, regular rhythm and normal heart sounds.        Mild systolic murmur  Pulmonary/Chest: Effort normal. No respiratory distress. She has no wheezes. She has no rales.       Slightly decreased breath sounds  Musculoskeletal: She exhibits no edema.       Hands and wrists without evidence of synovitis  Neurological: She is alert and oriented to person, place, and time.  Skin: She is not diaphoretic.  Psychiatric: She has a normal mood and affect. Her behavior is normal. Judgment and thought content normal.          Assessment & Plan:

## 2011-01-02 ENCOUNTER — Telehealth: Payer: Self-pay

## 2011-01-02 NOTE — Telephone Encounter (Signed)
Message copied by Beverely Low on Tue Jan 02, 2011 10:27 AM ------      Message from: Willow Ora E      Created: Mon Jan 01, 2011  5:21 PM       Advise patient, all labs satisfactory

## 2011-01-02 NOTE — Telephone Encounter (Signed)
Left message for pt to call back. Labs mailed 

## 2011-01-16 ENCOUNTER — Other Ambulatory Visit: Payer: Self-pay | Admitting: Internal Medicine

## 2011-01-16 MED ORDER — FLUTICASONE PROPIONATE 50 MCG/ACT NA SUSP: 2.0000 | Freq: Every day | NASAL | Status: DC

## 2011-01-16 MED ORDER — ALBUTEROL SULFATE HFA 108 (90 BASE) MCG/ACT IN AERS: 2.0000 | INHALATION_SPRAY | Freq: Four times a day (QID) | RESPIRATORY_TRACT | Status: DC | PRN

## 2011-01-16 MED ORDER — FLUTICASONE-SALMETEROL 100-50 MCG/DOSE IN AEPB: 1.0000 | INHALATION_SPRAY | Freq: Two times a day (BID) | RESPIRATORY_TRACT | Status: DC

## 2011-01-16 NOTE — Telephone Encounter (Signed)
Too early for hydrocodone.

## 2011-01-16 NOTE — Telephone Encounter (Signed)
Last OV 12/29/10. Last fill date for hydrocodone 11/28/10

## 2011-01-16 NOTE — Telephone Encounter (Signed)
All Rx's sent to pharmacy except hydrocodone

## 2011-01-19 ENCOUNTER — Other Ambulatory Visit: Payer: Self-pay | Admitting: *Deleted

## 2011-01-19 MED ORDER — HYDROCODONE-ACETAMINOPHEN 10-325 MG PO TABS: 1.0000 | ORAL_TABLET | Freq: Four times a day (QID) | ORAL | Status: DC | PRN

## 2011-01-19 NOTE — Telephone Encounter (Signed)
Call 120, no refills. Please are none patient that 120 tabs  is one-month supply.

## 2011-01-19 NOTE — Telephone Encounter (Signed)
Pt indicated that she usually take 1 tab in am, 1 at lunch time and then 2 tabs at bedtime. Pt notes that she has about 6 tabs left. Pt advise med is supposed to be taking 1 tab every 6 hours prn and will forward to dr Drue Novel.

## 2011-01-19 NOTE — Telephone Encounter (Signed)
Pt indicated that she is unable to function without med and desperately needs this med in order to do her daily activity. Last filled 11-28-10 #120 1, last OV 12-29-10. See decline 01-16-11

## 2011-01-19 NOTE — Telephone Encounter (Signed)
Was Rx #120 tablets and 1 RF on 11-28-10. Has enough to last until 01-28-11 unless she is taking more than what is advised

## 2011-01-19 NOTE — Telephone Encounter (Signed)
Discuss with patient, Rx sent. 

## 2011-02-08 ENCOUNTER — Other Ambulatory Visit: Payer: Self-pay | Admitting: Internal Medicine

## 2011-02-08 MED ORDER — CHLORDIAZEPOXIDE HCL 5 MG PO CAPS: ORAL_CAPSULE | ORAL | Status: DC

## 2011-02-08 NOTE — Telephone Encounter (Signed)
Sent!

## 2011-02-11 ENCOUNTER — Encounter: Payer: Self-pay | Admitting: Internal Medicine

## 2011-02-11 DIAGNOSIS — Z0000 Encounter for general adult medical examination without abnormal findings: Secondary | ICD-10-CM | POA: Insufficient documentation

## 2011-02-15 ENCOUNTER — Other Ambulatory Visit: Payer: Self-pay | Admitting: Internal Medicine

## 2011-02-15 ENCOUNTER — Encounter: Payer: Self-pay | Admitting: Internal Medicine

## 2011-02-15 ENCOUNTER — Ambulatory Visit (INDEPENDENT_AMBULATORY_CARE_PROVIDER_SITE_OTHER): Payer: Medicare Other | Admitting: Internal Medicine

## 2011-02-15 VITALS — BP 120/70 | HR 76 | Temp 98.2°F | Ht 64.0 in | Wt 106.0 lb

## 2011-02-15 DIAGNOSIS — M545 Low back pain: Secondary | ICD-10-CM

## 2011-02-15 DIAGNOSIS — J449 Chronic obstructive pulmonary disease, unspecified: Secondary | ICD-10-CM

## 2011-02-15 DIAGNOSIS — F329 Major depressive disorder, single episode, unspecified: Secondary | ICD-10-CM

## 2011-02-15 DIAGNOSIS — F411 Generalized anxiety disorder: Secondary | ICD-10-CM

## 2011-02-15 DIAGNOSIS — R7309 Other abnormal glucose: Secondary | ICD-10-CM

## 2011-02-15 DIAGNOSIS — F419 Anxiety disorder, unspecified: Secondary | ICD-10-CM

## 2011-02-15 DIAGNOSIS — F32A Depression, unspecified: Secondary | ICD-10-CM | POA: Insufficient documentation

## 2011-02-15 DIAGNOSIS — R7302 Impaired glucose tolerance (oral): Secondary | ICD-10-CM

## 2011-02-15 DIAGNOSIS — C50919 Malignant neoplasm of unspecified site of unspecified female breast: Secondary | ICD-10-CM | POA: Insufficient documentation

## 2011-02-15 HISTORY — DX: Anxiety disorder, unspecified: F41.9

## 2011-02-15 MED ORDER — CLONAZEPAM 0.5 MG PO TABS: ORAL_TABLET | ORAL | Status: DC

## 2011-02-15 MED ORDER — HYDROCODONE-ACETAMINOPHEN 10-325 MG PO TABS: 1.0000 | ORAL_TABLET | Freq: Four times a day (QID) | ORAL | Status: AC | PRN

## 2011-02-15 MED ORDER — FUROSEMIDE 20 MG PO TABS: 20.0000 mg | ORAL_TABLET | Freq: Every day | ORAL | Status: DC | PRN

## 2011-02-15 MED ORDER — TIOTROPIUM BROMIDE MONOHYDRATE 18 MCG IN CAPS: 18.0000 ug | ORAL_CAPSULE | Freq: Every day | RESPIRATORY_TRACT | Status: DC

## 2011-02-15 NOTE — Patient Instructions (Addendum)
OK to cancel march 2013 appt with Dr Drue Novel OK to stop the librium (chlordiazopoxide) Start the Clonazepam at .5 mg twice during the day, and then up to 3 pills at night for sleep OK to increase the pain medication to four times per day as needed Continue all other medications as before OK to re-start the spiriva regularly OK to use the proair only as needed Please return in May 2013 or sooner if needed

## 2011-02-15 NOTE — Telephone Encounter (Signed)
Please advise Hyrdrocodone refill?

## 2011-02-18 ENCOUNTER — Encounter: Payer: Self-pay | Admitting: Internal Medicine

## 2011-02-18 DIAGNOSIS — R7302 Impaired glucose tolerance (oral): Secondary | ICD-10-CM

## 2011-02-18 HISTORY — DX: Impaired glucose tolerance (oral): R73.02

## 2011-02-18 NOTE — Assessment & Plan Note (Signed)
Ok to adjust chronic med to qid prn, consider pain clinic, exam stable

## 2011-02-18 NOTE — Assessment & Plan Note (Signed)
Ok to consolidate med to one benzo for anxiety/sleep  - klonopin asd, d/c librium

## 2011-02-18 NOTE — Assessment & Plan Note (Signed)
Ok to use the spiriva daily scheduled, but the alb inh prn only

## 2011-02-18 NOTE — Assessment & Plan Note (Signed)
Denies worsening depressive symptoms, suicidal ideation, or panic; stable overall by hx and exam, and pt to continue medical treatment as before.

## 2011-02-18 NOTE — Progress Notes (Signed)
Subjective:    Patient ID: Jean Mitchell, female    DOB: 08-Oct-1927, 78 y.o.   MRN: [id number]  HPI  Here in transfer from Dr Drue Novel care as she is mostly concerned about the need for better uncontrolled chronic pain, anxiety and sleep issues on current meds;  Denies worsening depressive symptoms, suicidal ideation, or panic, though has ongoing anxiety.  Pt denies chest pain, increased sob or doe, wheezing, orthopnea, PND, increased LE swelling, palpitations, dizziness or syncope.  Pt denies new neurological symptoms such as new headache, or facial or extremity weakness or numbness   Pt denies polydipsia, polyuria, or low sugar symptoms such as weakness or confusion improved with po intake.  Pt states overall good compliance with meds.  Does have sense of ongoing fatigue, but denies signficant hypersomnolence.  States she has subjectively lost 30 lbs over 2 yrs.   Is also concerned as she is under the impression that the spiriva is a prn medication, and the albut inhaler is qid scheduled  Past Medical History  Diagnosis Date  . Breast cancer 2007    s/p XRT on Tamoxifen  . Hyperlipidemia   . CHF (congestive heart failure)   . COPD (chronic obstructive pulmonary disease)     o2 at night  . Depression   . AAA (abdominal aortic aneurysm)   . Back pain     lumbar, chronic  . Hypertension   . Osteoporosis     dexa 02-18-08  . Insomnia   . Esophageal stricture   . H/O: hysterectomy   . Hip fracture 08/2009    R, s/p ORIF  . Arthritis   . Ulcer   . Heart disease   . Colon polyps   . Anxiety 02/15/2011   Past Surgical History  Procedure Date  . Oophorectomy     unilateral  . Knee arthroscopy     right   . Hemorrhoid surgery   . Left partrial mastectomy   . Abdominal hysterectomy   . Tonsillectomy 1968  . Rotator cuff repair   . Cancer buttock     reports that she quit smoking about 7 years ago. She has never used smokeless tobacco. She reports that she does not drink alcohol or use  illicit drugs. family history includes Cancer in her father, mother, and sister and Heart disease in her mother and sister. Allergies  Allergen Reactions  . Ezetimibe-Simvastatin     REACTION: unspecified  . Gabapentin     REACTION: severe reaction, whelps   Current Outpatient Prescriptions on File Prior to Visit  Medication Sig Dispense Refill  . albuterol (PROVENTIL HFA;VENTOLIN HFA) 108 (90 BASE) MCG/ACT inhaler Inhale 2 puffs into the lungs every 6 (six) hours as needed for wheezing.  3.7 g  2  . albuterol (PROVENTIL) (2.5 MG/3ML) 0.083% nebulizer solution Take 3 mLs (2.5 mg total) by nebulization 4 (four) times daily.  1080 mL  0  . citalopram (CELEXA) 20 MG tablet 1.5 tablets daily  45 tablet  3  . fluticasone (FLONASE) 50 MCG/ACT nasal spray Place 2 sprays into the nose daily.  16 g  2  . Fluticasone-Salmeterol (ADVAIR DISKUS) 100-50 MCG/DOSE AEPB Inhale 1 puff into the lungs every 12 (twelve) hours.  60 each  2  . ibuprofen (ADVIL,MOTRIN) 200 MG tablet Take 200 mg by mouth 2 (two) times daily.        . pravastatin (PRAVACHOL) 40 MG tablet Take 1 tablet (40 mg total) by mouth daily.  30 tablet  3  . vitamin B-12 (CYANOCOBALAMIN) 100 MCG tablet Take 50 mcg by mouth daily.        Marland Kitchen VITAMIN D, CHOLECALCIFEROL, PO Take by mouth.         Review of Systems Review of Systems  Constitutional: Negative for diaphoresis and unexpected weight change.  HENT: Negative for drooling and tinnitus.   Eyes: Negative for photophobia and visual disturbance.  Respiratory: Negative for choking and stridor.   Gastrointestinal: Negative for vomiting and blood in stool.  Genitourinary: Negative for hematuria and decreased urine volume.    Objective:   Physical Exam BP 120/70  Pulse 76  Temp(Src) 98.2 F (36.8 C) (Oral)  Ht 5\' 4"  (1.626 m)  Wt 106 lb (48.081 kg)  BMI 18.19 kg/m2  SpO2 92% Physical Exam  VS noted Constitutional: Pt appears well-developed and well-nourished.  HENT: Head:  Normocephalic.  Right Ear: External ear normal.  Left Ear: External ear normal.  Eyes: Conjunctivae and EOM are normal. Pupils are equal, round, and reactive to light.  Neck: Normal range of motion. Neck supple.  Cardiovascular: Normal rate and regular rhythm.   Pulmonary/Chest: Effort normal and breath sounds normal.  Abd:  Soft, NT, non-distended, + BS Neurological: Pt is alert. No cranial nerve deficit.  Skin: Skin is warm. No erythema.  Psychiatric: Pt behavior is normal. Thought content normal. 1-2+ nervous, not dysphoric    Assessment & Plan:

## 2011-02-21 ENCOUNTER — Encounter: Payer: Self-pay | Admitting: Internal Medicine

## 2011-02-22 ENCOUNTER — Encounter: Payer: Self-pay | Admitting: Internal Medicine

## 2011-02-26 ENCOUNTER — Telehealth: Payer: Self-pay | Admitting: Internal Medicine

## 2011-02-26 ENCOUNTER — Encounter: Payer: Self-pay | Admitting: *Deleted

## 2011-02-26 NOTE — Telephone Encounter (Signed)
I Agree

## 2011-02-26 NOTE — Telephone Encounter (Signed)
States she has been sick since Christmas. States she started with constipation and took MOM 4 weeks ago. She now has diarrhea 4 or more times a day. States she is wearing diapers and soils her clothes sometimes. States when she get up in AM, sometimes her diaper is dirty or when she stands it just pours out. The diarrhea is string like with no bleeding. States when she eats if goes straight through her. She has abdominal pain with cramping sometimes. Denies antibiotic use or travel out of country. She tried taking Imodium with no relief. States she has had cancer on her buttocks and has a lot of cancer in her family and she is concerned. Scheduled patient on 02/27/11 at 3:00 PM.

## 2011-02-27 ENCOUNTER — Other Ambulatory Visit (INDEPENDENT_AMBULATORY_CARE_PROVIDER_SITE_OTHER): Payer: Medicare Other

## 2011-02-27 ENCOUNTER — Ambulatory Visit (INDEPENDENT_AMBULATORY_CARE_PROVIDER_SITE_OTHER): Payer: Medicare Other | Admitting: Internal Medicine

## 2011-02-27 ENCOUNTER — Encounter: Payer: Self-pay | Admitting: Internal Medicine

## 2011-02-27 DIAGNOSIS — R197 Diarrhea, unspecified: Secondary | ICD-10-CM

## 2011-02-27 DIAGNOSIS — R1319 Other dysphagia: Secondary | ICD-10-CM

## 2011-02-27 MED ORDER — RIFAXIMIN 550 MG PO TABS: 550.0000 mg | ORAL_TABLET | Freq: Two times a day (BID) | ORAL | Status: DC

## 2011-02-27 MED ORDER — PEG-KCL-NACL-NASULF-NA ASC-C 100 G PO SOLR: 1.0000 | Freq: Once | ORAL | Status: DC

## 2011-02-27 MED ORDER — ALIGN 4 MG PO CAPS: 1.0000 | ORAL_CAPSULE | ORAL | Status: DC

## 2011-02-27 NOTE — Patient Instructions (Signed)
You have been scheduled for an endoscopy with dilation and colonoscopy. Please follow the written instructions given to you at your visit today. Please pick up your prep at the pharmacy within the next 2-3 days. Your physician has requested that you go to the basement for the following lab work before leaving today: Stool culture, Stool for lactoferrin, C Diff by PCR, Sprue Profile, Sedimentation Rate, TSH. We have given you samples of Align. This puts good bacteria back into your intestines. You should take 1 capsule by mouth once daily. If this works well for you, it can be purchased over the counter. We have given you samples of Xifaxan to take 1 capsule twice daily x 7 days. CC: Dr [name]

## 2011-02-27 NOTE — Progress Notes (Signed)
Jean Mitchell August 16, 1927 MRN [id number]   History of Present Illness:  This is a 78 year old white female with several weeks of diarrhea which is watery and dark, and malodorous  but there is no visible blood. She is having mild crampy abdominal pain with no fever. She denies taking any antibiotics. Her symptoms started before Christmas. She has lost weight. In 2007, she weighed 123 pounds. She is currently down to 107 pounds. There is a history of an esophageal stricture and recurrent solid food dysphagia. She is status post prior dilatation in May 2009. A colonoscopy in November 2007 showed hemorrhoids. A prior colonoscopy in 2001 showed  tubular adenoma. She was treated for squamous cell carcinoma  of the perirectal area last year by Dr Janee Morn, she had a skin graft to the perineum. There is a family hx of colon cancer in her father and colon polyps in her sister   Past Medical History  Diagnosis Date  . Breast cancer 2007    s/p XRT on Tamoxifen  . Hyperlipidemia   . CHF (congestive heart failure)   . COPD (chronic obstructive pulmonary disease)     o2 at night  . Depression   . AAA (abdominal aortic aneurysm)   . Back pain     lumbar, chronic  . Hypertension   . Osteoporosis     dexa 02-18-08  . Insomnia   . Esophageal stricture   . Adenomatous colon polyp   . Hip fracture 08/2009    R, s/p ORIF  . Arthritis   . Ulcer   . Heart disease   . Diverticulosis   . Anxiety 02/15/2011  . Impaired glucose tolerance 02/18/2011  . Internal hemorrhoids   . 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 Oophorectomy     unilateral  . Knee arthroscopy     right   . Hemorrhoid surgery 1965  . Mastectomy, partial 2007    left  . Abdominal hysterectomy 1968  . Tonsillectomy 1968  . Rotator cuff repair   . Anal carcinoma resection   . Kyphosis surgery     balloon  . Cesarean section     x 2    reports that she quit smoking about 7 years ago. She  has never used smokeless tobacco. She reports that she does not drink alcohol or use illicit drugs. family history includes Breast cancer in her sister; Cancer in her mother; Colon cancer (age of onset:80) in her father; Colon polyps in her sister; and Heart disease in her mother and sister. Allergies  Allergen Reactions  . Ezetimibe-Simvastatin     REACTION: unspecified  . Gabapentin     REACTION: severe reaction, whelps        Review of Systems: Positive for dysphagia to solids. Negative for odynophagia. Denies chest pain. Positive for weight loss and diarrhea  The remainder of the 10 point ROS is negative except as outlined in H&P   Physical Exam: General appearance  Well developed, in no distress. Cachectic Eyes- non icteric. HEENT nontraumatic, normocephalic. Mouth no lesions, tongue papillated, no cheilosis. Neck supple without adenopathy, thyroid not enlarged, no carotid bruits, no JVD. Lungs Clear to auscultation bilaterally. Cor normal S1, normal S2, regular rhythm, no murmur,  quiet precordium. Abdomen: soft with palpable loops of the bowel. Normoactive bowel sounds. No tenderness. No palpable mass. Rectal: Perianal scarring from skin graft at the rectum. Rectal sphincter tone is normal and stool is Hemoccult negative. Extremities  no pedal edema. Skin no lesions. Neurological alert and oriented x 3. Psychological normal mood and affect.  Assessment and Plan:  Problem #1 Acute diarrheal illness with watery stools. She is Hemoccult-negative. We will order stool studies today and will schedule  colonoscopy to rule out microscopic colitis. She has a long-standing history of constipation and redundant colon and this is very unusual for her to be having diarrhea and weight loss. We will also obtain a sprue profile and try her on Xifaxan 550 mg twice a day and Align as a probiotic ( samples provided).  Problem #2 Solid food dysphagia. Patient has a history of esophageal  stricture. We will go away and schedule an upper endoscopy with esophageal dilation.   02/27/2011 [name]

## 2011-02-28 ENCOUNTER — Other Ambulatory Visit: Payer: Medicare Other

## 2011-02-28 DIAGNOSIS — R197 Diarrhea, unspecified: Secondary | ICD-10-CM

## 2011-02-28 LAB — CELIAC PANEL 10
Endomysial Screen: NEGATIVE
Gliadin IgA: 22.6 U/mL — ABNORMAL HIGH (ref <20)
Tissue Transglut Ab: 6.2 U/mL (ref <20)
Tissue Transglutaminase Ab, IgA: 4.7 U/mL (ref <20)

## 2011-03-01 ENCOUNTER — Telehealth: Payer: Self-pay | Admitting: *Deleted

## 2011-03-01 NOTE — Telephone Encounter (Signed)
Spoke with patient and gave her results and recommendation. She is scheduled for ECl on 03/14/11.

## 2011-03-01 NOTE — Telephone Encounter (Signed)
Message copied by Daphine Deutscher on Thu Mar 01, 2011  1:36 PM ------      Message from: Hart Carwin      Created: Thu Mar 01, 2011 10:32 AM       I will do a small bowl biopsy on her when she has her EGD/colonoscopy scheduled, Stool lactoferin negative. When is she scheduled for EGD/colon??

## 2011-03-08 NOTE — Telephone Encounter (Signed)
She is now patient of Dr Jonny Ruiz, refer request to him

## 2011-03-08 NOTE — Telephone Encounter (Signed)
Please allow MD in the office to handle

## 2011-03-11 NOTE — Telephone Encounter (Signed)
Too soon, as pt has rx in January 2013, good for total 5 mo

## 2011-03-12 NOTE — Telephone Encounter (Signed)
Called the patient informed of MD's instructions. She did not realize she had refills.

## 2011-03-12 NOTE — Telephone Encounter (Signed)
Called patient; left message to call back.

## 2011-03-14 ENCOUNTER — Ambulatory Visit (AMBULATORY_SURGERY_CENTER): Payer: Medicare Other | Admitting: Internal Medicine

## 2011-03-14 ENCOUNTER — Encounter: Payer: Self-pay | Admitting: Internal Medicine

## 2011-03-14 DIAGNOSIS — K2970 Gastritis, unspecified, without bleeding: Secondary | ICD-10-CM

## 2011-03-14 DIAGNOSIS — R1319 Other dysphagia: Secondary | ICD-10-CM

## 2011-03-14 DIAGNOSIS — Z86010 Personal history of colonic polyps: Secondary | ICD-10-CM

## 2011-03-14 DIAGNOSIS — K2990 Gastroduodenitis, unspecified, without bleeding: Secondary | ICD-10-CM

## 2011-03-14 DIAGNOSIS — D126 Benign neoplasm of colon, unspecified: Secondary | ICD-10-CM

## 2011-03-14 DIAGNOSIS — R197 Diarrhea, unspecified: Secondary | ICD-10-CM

## 2011-03-14 MED ORDER — SODIUM CHLORIDE 0.9 % IV SOLN: 500.0000 mL | INTRAVENOUS | Status: DC

## 2011-03-14 NOTE — Op Note (Signed)
[facility] [address] [place], [place]  [address]  ENDOSCOPY PROCEDURE REPORT  PATIENT:  Jean Mitchell, Jean Mitchell  MR#:  [id number] BIRTHDATE:  1927/12/02, 83 yrs. old  GENDER:  female  ENDOSCOPIST:  Hedwig Morton. Juanda Chance, MD Referred by:  PROCEDURE DATE:  03/14/2011 PROCEDURE:  EGD with biopsy, 43239, Maloney Dilation of Esophagus ASA CLASS:  Class III INDICATIONS:  weight loss, dysphagia hx es. stricture 2000, 06/2007   MEDICATIONS:   These medications were titrated to patient response per physician's verbal order, Versed 3 mg, Fentanyl 60 mcg TOPICAL ANESTHETIC:  DESCRIPTION OF PROCEDURE:   After the risks benefits and alternatives of the procedure were thoroughly explained, informed consent was obtained.  The [facility] [id number] endoscope was introduced through the mouth and advanced to the second portion of the duodenum, without limitations.  The instrument was slowly withdrawn as the mucosa was fully examined. <<PROCEDUREIMAGES>>  Presbyesophagus was found (see image1 and image2). no evidence of a stricture, angulated distal esophagus maloney dilator 48 F Maloney dil passed  Mild gastritis was found. With standard forceps, a biopsy was obtained and sent to pathology (see image4). Otherwise the examination was normal (see image3 and image5). Retroflexed views revealed no abnormalities.    The scope was then withdrawn from the patient and the procedure completed.  COMPLICATIONS:  None  ENDOSCOPIC IMPRESSION: 1) Presbyesophagus 2) Mild gastritis 3) Otherwise normal examination s/p passage of 38F maloney dilator RECOMMENDATIONS: 1) Anti-reflux regimen to be follow soft diet, suspect motility dysfunction  REPEAT EXAM:  In 0 year(s) for.  ______________________________ Hedwig Morton. Juanda Chance, MD  CC:  n. eSIGNED:   Hedwig Morton. Danahi Reddish at 03/14/2011 04:45 PM  [place]aks, [id number]

## 2011-03-14 NOTE — Progress Notes (Signed)
Patient did not experience any of the following events: a burn prior to discharge; a fall within the facility; wrong site/side/patient/procedure/implant event; or a hospital transfer or hospital admission upon discharge from the facility. (G8907) Patient did not have preoperative order for IV antibiotic SSI prophylaxis. (G8918)  

## 2011-03-14 NOTE — Progress Notes (Signed)
Pt given 50 mcg fentanyl and 4 mg versed and states doesn't feel drowsy at all. Per dr Juanda Chance 12.5 mg benadryl given iv x1. Pt still appears alert. 2 mg versed additional administered per md. Pt fought with passage of scope for endoscopy.. Procedure did complete. Additional sedation given per dr Juanda Chance for colonoscopy and pt fought through out entire procedure. Rolling on back, yelling in room and unable to sedate or comfort pt. All meds titrated per dr Juanda Chance. After multiple attempts to pass scope to cecum, procedure incomplete per dr. Juanda Chance. Pt resting with eyes closed as scope with drawn. Abdomen tight and distended and md aware. No further yelling or moving restless in bed.  After procedure over, pt sleeping in procedure room, abdomen distended and pt with no distress noted. ewm

## 2011-03-14 NOTE — Patient Instructions (Signed)
Discharge instructions given with verbal understanding. Handouts on polyps, gastritis and a dilatation diet given. Resume previous medications.

## 2011-03-14 NOTE — Op Note (Signed)
[facility] [address] [place], [place]  [address]  COLONOSCOPY PROCEDURE REPORT  PATIENT:  Jean Mitchell, Jean Mitchell  MR#:  [id number] BIRTHDATE:  November 29, 1927, 83 yrs. old  GENDER:  female ENDOSCOPIST:  Hedwig Morton. Juanda Chance, MD REF. BY:  [name], M.D. PROCEDURE DATE:  03/14/2011 PROCEDURE:  Colonoscopy with biopsy ASA CLASS:  Class III INDICATIONS:  family history of colon cancer, history of pre-cancerous (adenomatous) colon polyps, weight loss, family Hx of polyps father with colon cancer, prior colon 2001-tubular adenoma, recent hx of squamous cell carcinoma of the anal canal MEDICATIONS:   These medications were titrated to patient response per physician's verbal order, Versed 4 mg, Fentanyl 50 mcg  DESCRIPTION OF PROCEDURE:   After the risks and benefits and of the procedure were explained, informed consent was obtained. Digital rectal exam was performed and revealed decreased sphincter tone.   The [facility] [id number] endoscope was introduced through the anus and advanced to the hepatic flexure.  The quality of the prep was good, using MoviPrep.  The instrument was then slowly withdrawn as the colon was fully examined. <<PROCEDUREIMAGES>>  FINDINGS:  incomplete exam. tortuous colon,  A sessile polyp was found. at 30 cm 4 mm sessile polyp The polyp was removed using cold biopsy forceps (see image3 and image2).  This was otherwise a normal examination of the colon (see image1, image4, image5, and image6). difficult sedation due to COPD, tortuous colon, incomplete sedation,   Retroflexed views in the rectum revealed no abnormalities.    The scope was then withdrawn from the patient and the procedure completed.  COMPLICATIONS:  None ENDOSCOPIC IMPRESSION: 1) Incomplete exam 2) Sessile polyp 3) Otherwise normal examination reached around hepatic flexure, RECOMMENDATIONS: 1) Await pathology results follow up hemoccults, consider CT scan, would not recommend BE due to  general condition  REPEAT EXAM:  In 0 year(s) for.  ______________________________ Hedwig Morton. Juanda Chance, MD  CC:  n. eSIGNED:   Hedwig Morton. Armani Brar at 03/14/2011 04:39 PM  [place], [id number]

## 2011-03-15 ENCOUNTER — Telehealth: Payer: Self-pay | Admitting: *Deleted

## 2011-03-15 NOTE — Telephone Encounter (Signed)
Called number given in admitting yesterday and rang greater than 20 times with no answer so no message left.ewm

## 2011-03-16 ENCOUNTER — Other Ambulatory Visit: Payer: Self-pay | Admitting: *Deleted

## 2011-03-16 ENCOUNTER — Telehealth: Payer: Self-pay | Admitting: *Deleted

## 2011-03-16 DIAGNOSIS — D649 Anemia, unspecified: Secondary | ICD-10-CM

## 2011-03-16 NOTE — Telephone Encounter (Signed)
Spoke with patient and told her I would be mailing out hemoccult cards for her.

## 2011-03-20 ENCOUNTER — Other Ambulatory Visit: Payer: Self-pay

## 2011-03-20 ENCOUNTER — Encounter: Payer: Self-pay | Admitting: Internal Medicine

## 2011-03-20 NOTE — Telephone Encounter (Signed)
Already on klonopin, so should not take this as well

## 2011-03-20 NOTE — Telephone Encounter (Signed)
Pharmacy requesting refill on Chlordiazepoxide 5 mg cap. Directions to take 1 cap. In the morning, 1 in the afternoon and 2 at bedtime. Prescription is currently not on medication list. Please advise

## 2011-03-20 NOTE — Telephone Encounter (Signed)
Pharmacy informed of MD's instructions 

## 2011-03-26 ENCOUNTER — Encounter: Payer: Self-pay | Admitting: *Deleted

## 2011-03-27 ENCOUNTER — Ambulatory Visit: Payer: Medicare Other | Admitting: Internal Medicine

## 2011-03-30 ENCOUNTER — Emergency Department (HOSPITAL_COMMUNITY)
Admission: EM | Admit: 2011-03-30 | Discharge: 2011-03-30 | Disposition: A | Discharge status: Home / Self Care | Payer: Medicare Other | Attending: Emergency Medicine | Admitting: Emergency Medicine

## 2011-03-30 ENCOUNTER — Encounter (HOSPITAL_COMMUNITY): Payer: Self-pay

## 2011-03-30 ENCOUNTER — Emergency Department (HOSPITAL_COMMUNITY): Payer: Medicare Other

## 2011-03-30 DIAGNOSIS — I714 Abdominal aortic aneurysm, without rupture, unspecified: Secondary | ICD-10-CM | POA: Insufficient documentation

## 2011-03-30 DIAGNOSIS — G8929 Other chronic pain: Secondary | ICD-10-CM | POA: Insufficient documentation

## 2011-03-30 DIAGNOSIS — M810 Age-related osteoporosis without current pathological fracture: Secondary | ICD-10-CM | POA: Insufficient documentation

## 2011-03-30 DIAGNOSIS — M129 Arthropathy, unspecified: Secondary | ICD-10-CM | POA: Insufficient documentation

## 2011-03-30 DIAGNOSIS — K5730 Diverticulosis of large intestine without perforation or abscess without bleeding: Secondary | ICD-10-CM | POA: Insufficient documentation

## 2011-03-30 DIAGNOSIS — Z860100 Personal history of colon polyps, unspecified: Secondary | ICD-10-CM | POA: Insufficient documentation

## 2011-03-30 DIAGNOSIS — M545 Low back pain, unspecified: Secondary | ICD-10-CM | POA: Insufficient documentation

## 2011-03-30 DIAGNOSIS — I519 Heart disease, unspecified: Secondary | ICD-10-CM | POA: Insufficient documentation

## 2011-03-30 DIAGNOSIS — I10 Essential (primary) hypertension: Secondary | ICD-10-CM | POA: Insufficient documentation

## 2011-03-30 DIAGNOSIS — R279 Unspecified lack of coordination: Secondary | ICD-10-CM | POA: Insufficient documentation

## 2011-03-30 DIAGNOSIS — F411 Generalized anxiety disorder: Secondary | ICD-10-CM | POA: Insufficient documentation

## 2011-03-30 DIAGNOSIS — J449 Chronic obstructive pulmonary disease, unspecified: Secondary | ICD-10-CM | POA: Insufficient documentation

## 2011-03-30 DIAGNOSIS — E785 Hyperlipidemia, unspecified: Secondary | ICD-10-CM | POA: Insufficient documentation

## 2011-03-30 DIAGNOSIS — F3289 Other specified depressive episodes: Secondary | ICD-10-CM | POA: Insufficient documentation

## 2011-03-30 DIAGNOSIS — E876 Hypokalemia: Secondary | ICD-10-CM | POA: Insufficient documentation

## 2011-03-30 DIAGNOSIS — J4489 Other specified chronic obstructive pulmonary disease: Secondary | ICD-10-CM | POA: Insufficient documentation

## 2011-03-30 DIAGNOSIS — Z853 Personal history of malignant neoplasm of breast: Secondary | ICD-10-CM | POA: Insufficient documentation

## 2011-03-30 DIAGNOSIS — R42 Dizziness and giddiness: Secondary | ICD-10-CM | POA: Insufficient documentation

## 2011-03-30 DIAGNOSIS — G4700 Insomnia, unspecified: Secondary | ICD-10-CM | POA: Insufficient documentation

## 2011-03-30 DIAGNOSIS — F329 Major depressive disorder, single episode, unspecified: Secondary | ICD-10-CM | POA: Insufficient documentation

## 2011-03-30 DIAGNOSIS — R5383 Other fatigue: Secondary | ICD-10-CM

## 2011-03-30 DIAGNOSIS — R197 Diarrhea, unspecified: Secondary | ICD-10-CM | POA: Insufficient documentation

## 2011-03-30 DIAGNOSIS — I509 Heart failure, unspecified: Secondary | ICD-10-CM | POA: Insufficient documentation

## 2011-03-30 DIAGNOSIS — Z9181 History of falling: Secondary | ICD-10-CM | POA: Insufficient documentation

## 2011-03-30 DIAGNOSIS — Z79899 Other long term (current) drug therapy: Secondary | ICD-10-CM | POA: Insufficient documentation

## 2011-03-30 LAB — COMPREHENSIVE METABOLIC PANEL
ALT: 10 U/L (ref 0–35)
Alkaline Phosphatase: 51 U/L (ref 39–117)
BUN: 7 mg/dL (ref 6–23)
CO2: 34 mEq/L — ABNORMAL HIGH (ref 19–32)
Chloride: 102 mEq/L (ref 96–112)
GFR calc Af Amer: >90 mL/min (ref >90)
GFR calc non Af Amer: 86 mL/min — ABNORMAL LOW (ref >90)
Glucose, Bld: 87 mg/dL (ref 70–99)
Potassium: 3.4 mEq/L — ABNORMAL LOW (ref 3.5–5.1)
Sodium: 142 mEq/L (ref 135–145)
Total Bilirubin: 0.5 mg/dL (ref 0.3–1.2)
Total Protein: 6.5 g/dL (ref 6.0–8.3)

## 2011-03-30 LAB — DIFFERENTIAL
Eosinophils Absolute: 0.2 10*3/uL (ref 0.0–0.7)
Lymphocytes Relative: 23 % (ref 12–46)
Lymphs Abs: 1.5 10*3/uL (ref 0.7–4.0)
Monocytes Relative: 13 % — ABNORMAL HIGH (ref 3–12)
Neutrophils Relative %: 61 % (ref 43–77)

## 2011-03-30 LAB — URINALYSIS, ROUTINE W REFLEX MICROSCOPIC
Bilirubin Urine: NEGATIVE
Ketones, ur: NEGATIVE mg/dL
Leukocytes, UA: NEGATIVE
Nitrite: NEGATIVE
Protein, ur: NEGATIVE mg/dL
Urobilinogen, UA: 0.2 mg/dL (ref 0.0–1.0)

## 2011-03-30 LAB — CBC
Hemoglobin: 10.9 g/dL — ABNORMAL LOW (ref 12.0–15.0)
MCH: 31.5 pg (ref 26.0–34.0)
MCV: 96.2 fL (ref 78.0–100.0)
RBC: 3.46 MIL/uL — ABNORMAL LOW (ref 3.87–5.11)
WBC: 6.6 10*3/uL (ref 4.0–10.5)

## 2011-03-30 MED ORDER — POTASSIUM CHLORIDE ER 10 MEQ PO TBCR: 10.0000 meq | EXTENDED_RELEASE_TABLET | Freq: Every day | ORAL | Status: DC

## 2011-03-30 NOTE — ED Notes (Signed)
Patient states that since before thanksgiving she has been having diarrhea. Over the past few days she states that she has been feeling very sleepy and has been sleeping a lot lately. She denies any trauma. When asked if she has consulted with pcp about these issues she states that she quit going to see dr.paz because he was rude and she has been seen by dr. [name] but didn't get any answers. Alert and oriented.

## 2011-03-30 NOTE — ED Provider Notes (Signed)
Medical screening examination/treatment/procedure(s) were performed by non-physician practitioner and as supervising physician I was immediately available for consultation/collaboration.  Nicholes Stairs, MD 03/30/11 [phone]

## 2011-03-30 NOTE — ED Provider Notes (Signed)
Patient here with worsening fatigue over the past two weeks with multiple falls, her family has noted that she now has a shuffling gait, she fell again today, she reports a history of chronic lower back pain with a history of osteoporosis, denies headache but states that she feels dizzy with standing up.  Alert, oriented, MOEX4, lungs CTA bilaterally, Heart RRR without RMG, abd soft, nttp, gait shuffling with marked ataxia.  Jean Mitchell, [place] 03/30/11 2143

## 2011-03-30 NOTE — ED Notes (Signed)
Patient's son in waiting room. Will notify him once patient is placed in a room

## 2011-03-30 NOTE — ED Notes (Signed)
Patient sitting in waiting room with son; currently waiting to be placed in a room; aaox3, resp e/u, nad

## 2011-03-30 NOTE — ED Provider Notes (Signed)
History     CSN: [id number]  Arrival date & time 03/30/11  1633   First MD Initiated Contact with Patient 03/30/11 2022      Chief Complaint  Patient presents with  . Fatigue    multiple complaints    (Consider location/radiation/quality/duration/timing/severity/associated sxs/prior treatment) Patient is a 78 y.o. female presenting with diarrhea and weakness. The history is provided by the patient.  Diarrhea The primary symptoms include fatigue and diarrhea. Primary symptoms do not include fever, abdominal pain, nausea, vomiting, dysuria, arthralgias or rash. Primary symptoms comment: diarrhea and generalized fatigue The illness began more than 7 days ago (since november). The onset was gradual. The problem has not changed (was initially daily, now every other day) since onset. The illness does not include chills, constipation or back pain.  Weakness Primary symptoms do not include dizziness, fever, nausea or vomiting. Primary symptoms comment: diarrhea and generalized fatigue The symptoms began 3 to 5 days ago. The symptoms are unchanged. The neurological symptoms are diffuse. Context: none.  Additional symptoms include weakness (generalized). Additional symptoms do not include dysphoric mood. Procedure history comments: stool cultures, colonoscopy and EGD.    Past Medical History  Diagnosis Date  . Breast cancer 2007    s/p XRT on Tamoxifen  . Hyperlipidemia   . CHF (congestive heart failure)   . COPD (chronic obstructive pulmonary disease)     o2 at night  . Depression   . AAA (abdominal aortic aneurysm)   . Back pain     lumbar, chronic  . Hypertension   . Osteoporosis     dexa 02-18-08  . Insomnia   . Esophageal stricture   . Adenomatous colon polyp   . Hip fracture 08/2009    R, s/p ORIF  . Arthritis   . Ulcer   . Heart disease   . Diverticulosis   . Anxiety 02/15/2011  . Impaired glucose tolerance 02/18/2011  . Internal hemorrhoids   . Anal squamous cell carcinoma  12/13/08    Past Surgical History  Procedure Date  . Oophorectomy     unilateral  . Knee arthroscopy     right   . Hemorrhoid surgery 1965  . Mastectomy, partial 2007    left  . Abdominal hysterectomy 1968  . Tonsillectomy 1968  . Rotator cuff repair   . Anal carcinoma resection   . Kyphosis surgery     balloon  . Cesarean section     x 2    Family History  Problem Relation Age of Onset  . Heart disease Mother   . Cancer Mother     bladder  . Colon cancer Father 33  . Heart disease Sister   . Breast cancer Sister   . Colon polyps Sister     History  Substance Use Topics  . Smoking status: Former Smoker    Quit date: 02/13/2004  . Smokeless tobacco: Never Used  . Alcohol Use: No    OB History    Grav Para Term Preterm Abortions TAB SAB Ect Mult Living                  Review of Systems  Constitutional: Positive for fatigue. Negative for fever and chills.  HENT: Negative for congestion, sore throat and rhinorrhea.   Eyes: Negative for pain, redness and visual disturbance.  Respiratory: Negative for chest tightness and shortness of breath.   Cardiovascular: Negative for chest pain and palpitations.  Gastrointestinal: Positive for diarrhea. Negative for nausea, vomiting, abdominal  pain, constipation and rectal pain.  Genitourinary: Negative for dysuria and difficulty urinating.  Musculoskeletal: Negative for back pain and arthralgias.  Skin: Negative for rash and wound.  Neurological: Positive for weakness (generalized). Negative for dizziness and numbness.  Psychiatric/Behavioral: Negative for confusion and dysphoric mood.  All other systems reviewed and are negative.    Allergies  Ezetimibe-simvastatin and Gabapentin  Home Medications   Current Outpatient Rx  Name Route Sig Dispense Refill  . ALBUTEROL SULFATE HFA 108 (90 BASE) MCG/ACT IN AERS Inhalation Inhale 2 puffs into the lungs every 6 (six) hours as needed. For shortness of breath    .  ALBUTEROL SULFATE (2.5 MG/3ML) 0.083% IN NEBU Nebulization Take 2.5 mg by nebulization 4 (four) times daily.    Marland Kitchen CITALOPRAM HYDROBROMIDE 20 MG PO TABS Oral Take 10-20 mg by mouth 2 (two) times daily. Take half tablet in the morning and a whole tablet at night    . CLONAZEPAM 0.5 MG PO TABS Oral Take 0.5 mg by mouth 3 (three) times daily as needed. For anxiety    . FLUTICASONE PROPIONATE 50 MCG/ACT NA SUSP Nasal Place 2 sprays into the nose daily.    Marland Kitchen FLUTICASONE-SALMETEROL 100-50 MCG/DOSE IN AEPB Inhalation Inhale 1 puff into the lungs every 12 (twelve) hours.    . FUROSEMIDE 20 MG PO TABS Oral Take 20 mg by mouth every Monday, Wednesday, and Friday.    Marland Kitchen HYDROCODONE-ACETAMINOPHEN 10-325 MG PO TABS Oral Take 1 tablet by mouth 4 (four) times daily as needed. For pain    . IBUPROFEN 200 MG PO TABS Oral Take 200 mg by mouth 2 (two) times daily.      Marland Kitchen PRAVASTATIN SODIUM 40 MG PO TABS Oral Take 40 mg by mouth daily.    Marland Kitchen TIOTROPIUM BROMIDE MONOHYDRATE 18 MCG IN CAPS Inhalation Place 18 mcg into inhaler and inhale daily.    Marland Kitchen VITAMIN B-12 100 MCG PO TABS Oral Take 50 mcg by mouth daily.      Marland Kitchen VITAMIN D (CHOLECALCIFEROL) PO Oral Take 1 tablet by mouth daily.     Marland Kitchen POTASSIUM CHLORIDE ER 10 MEQ PO TBCR Oral Take 1 tablet (10 mEq total) by mouth daily. 5 tablet 0    BP 131/76  Pulse 72  Temp(Src) 98.1 F (36.7 C) (Oral)  Resp 18  SpO2 99%  Physical Exam  Nursing note and vitals reviewed. Constitutional: She is oriented to person, place, and time. She appears well-developed and well-nourished. No distress.  HENT:  Head: Normocephalic and atraumatic.  Mouth/Throat: Oropharynx is clear and moist.  Eyes: EOM are normal. Pupils are equal, round, and reactive to light.  Neck: Normal range of motion. Neck supple.  Cardiovascular: Normal rate, regular rhythm and normal heart sounds.  Exam reveals no friction rub.   No murmur heard. Pulmonary/Chest: Effort normal and breath sounds normal. No  respiratory distress. She has no wheezes. She has no rales.  Abdominal: Soft. There is no tenderness. There is no rebound and no guarding.  Musculoskeletal: Normal range of motion. She exhibits no edema and no tenderness.  Lymphadenopathy:    She has no cervical adenopathy.  Neurological: She is alert and oriented to person, place, and time.  Skin: Skin is warm and dry. No rash noted.  Psychiatric: She has a normal mood and affect. Her behavior is normal.    ED Course  Procedures (including critical care time)  Results for orders placed during the hospital encounter of 03/30/11  CBC  Component Value Range   WBC 6.6  4.0 - 10.5 (K/uL)   RBC 3.46 (*) 3.87 - 5.11 (MIL/uL)   Hemoglobin 10.9 (*) 12.0 - 15.0 (g/dL)   HCT 16.1 (*) 09.6 - 46.0 (%)   MCV 96.2  78.0 - 100.0 (fL)   MCH 31.5  26.0 - 34.0 (pg)   MCHC 32.7  30.0 - 36.0 (g/dL)   RDW 04.5  40.9 - 81.1 (%)   Platelets 216  150 - 400 (K/uL)  DIFFERENTIAL      Component Value Range   Neutrophils Relative 61  43 - 77 (%)   Neutro Abs 4.0  1.7 - 7.7 (K/uL)   Lymphocytes Relative 23  12 - 46 (%)   Lymphs Abs 1.5  0.7 - 4.0 (K/uL)   Monocytes Relative 13 (*) 3 - 12 (%)   Monocytes Absolute 0.9  0.1 - 1.0 (K/uL)   Eosinophils Relative 3  0 - 5 (%)   Eosinophils Absolute 0.2  0.0 - 0.7 (K/uL)   Basophils Relative 0  0 - 1 (%)   Basophils Absolute 0.0  0.0 - 0.1 (K/uL)  COMPREHENSIVE METABOLIC PANEL      Component Value Range   Sodium 142  135 - 145 (mEq/L)   Potassium 3.4 (*) 3.5 - 5.1 (mEq/L)   Chloride 102  96 - 112 (mEq/L)   CO2 34 (*) 19 - 32 (mEq/L)   Glucose, Bld 87  70 - 99 (mg/dL)   BUN 7  6 - 23 (mg/dL)   Creatinine, Ser 9.14  0.50 - 1.10 (mg/dL)   Calcium 78.2  8.4 - 10.5 (mg/dL)   Total Protein 6.5  6.0 - 8.3 (g/dL)   Albumin 4.0  3.5 - 5.2 (g/dL)   AST 16  0 - 37 (U/L)   ALT 10  0 - 35 (U/L)   Alkaline Phosphatase 51  39 - 117 (U/L)   Total Bilirubin 0.5  0.3 - 1.2 (mg/dL)   GFR calc non Af Amer 86 (*) >90  (mL/min)   GFR calc Af Amer >90  >90 (mL/min)  URINALYSIS, ROUTINE W REFLEX MICROSCOPIC      Component Value Range   Color, Urine YELLOW  YELLOW    APPearance CLEAR  CLEAR    Specific Gravity, Urine 1.008  1.005 - 1.030    pH 7.5  5.0 - 8.0    Glucose, UA NEGATIVE  NEGATIVE (mg/dL)   Hgb urine dipstick NEGATIVE  NEGATIVE    Bilirubin Urine NEGATIVE  NEGATIVE    Ketones, ur NEGATIVE  NEGATIVE (mg/dL)   Protein, ur NEGATIVE  NEGATIVE (mg/dL)   Urobilinogen, UA 0.2  0.0 - 1.0 (mg/dL)   Nitrite NEGATIVE  NEGATIVE    Leukocytes, UA NEGATIVE  NEGATIVE     Dg Chest 2 View  03/30/2011  *RADIOLOGY REPORT*  Clinical Data: Fatigue.  Fell today.  CHEST - 2 VIEW  Comparison: Chest radiograph 12/02/2010.  Lumbar spine radiographs 08/24/2009.  Findings: Stable mild cardiomegaly.  Tortuous thoracic aorta contour is stable.  Pulmonary vascularity is normal.  The lungs are clear.  Both lungs are markedly hyperexpanded, with flattening of the diaphragms.  There is no pleural effusion or pneumothorax.  Vertebroplasty changes are seen at one level of the thoracic spine, and in the L1 and L2 vertebral bodies.  Loss of vertebral body height at L3 appears stable compared to prior lumbar spine radiographs.  Loss of height of the thoracic spine vertebral body one level cephalad to the  augmented level in the thoracic spine is also stable compared to prior chest radiograph.  No acute rib fracture is identified.  IMPRESSION:  1.  Marked emphysema/COPD. 2.  No acute findings. 3.  Stable compression deformities and prepared to the plasty changes of the thoracic and visualized lumbar spine.  Original Report Authenticated By: Britta Mccreedy, M.D.   Ct Head Wo Contrast  03/30/2011  *RADIOLOGY REPORT*  Clinical Data: Fall hitting back of head.  CT HEAD WITHOUT CONTRAST  Technique:  Contiguous axial images were obtained from the base of the skull through the vertex without contrast.  Comparison: 08/02/2004  Findings: Bone windows  demonstrate no significant soft tissue swelling.  No skull fracture.  Clear paranasal sinuses and mastoid air cells.  Soft tissue windows demonstrate minimal artifact in the region of the posterior vertex.  Mild low density in the periventricular white matter likely related to small vessel disease. No  mass lesion, hemorrhage, hydrocephalus, acute infarct, intra-axial, or extra-axial fluid collection.  IMPRESSION: No acute intracranial abnormality.  Original Report Authenticated By: Consuello Bossier, M.D.     1. Diarrhea   2. Fatigue   3. Hypokalemia       MDM  68:79 AM 78 year old female with history of COPD seen here for diarrhea and fatigue. States the diarrhea started in November and initially was daily for 3 weeks and now is roughly every other day with 34 loose stools with mucus per day. She denies any blood in her stool. She states the past several days he's also been fatigued with generalized weakness. Todayshe was running after her dog and did trip and fall her head. she did not lose consciousness. She has had outpatient workup with GI including upper and lower endoscopy and stool cultures. Her primary concern here is that she might have bone cancer as she's had a friend with similar symptoms who was ultimately dx'd with bone cancer. She denies any any fever cough or other infectious symptoms. She is not on any antibiotics. She's had no recent travel. She has no known sick contacts with similar symptoms. She appears well. Her vitals are stable. Her abdominal exam is benign. A CBC, CMP, urine studies, head CT and chest x-ray were unremarkable here except for a mildly low potassium. Will treat with 5 days of of potassium/day and have this rechecked. a stool culture has been done by GI so was not felt necessary to repeat this. This was discussed with family and they are understanding. She will followup with her PCP for next available appointment for further evaluation. She was given return  precautions and was discharged in stable condition.        Sheran Luz, MD 03/31/11 0111

## 2011-03-30 NOTE — ED Notes (Signed)
Patient transported to CT 

## 2011-04-02 NOTE — ED Provider Notes (Signed)
I saw and evaluated the patient, reviewed the resident's note and I agree with the findings and plan. Diarrhea for 3 weeks. Previously extensively worked up. She's had a GI consult among other workups. Patient appears well. She has a mild hypokalemia that'll be treated. She'll be followed up by her primary care doctor as needed. Family is amenable to this treatment  Juliet Rude. Rubin Payor, MD 04/02/11 [phone]

## 2011-04-03 ENCOUNTER — Other Ambulatory Visit: Payer: Medicare Other

## 2011-04-04 DIAGNOSIS — Z1289 Encounter for screening for malignant neoplasm of other sites: Secondary | ICD-10-CM

## 2011-04-04 LAB — HEMOCCULT SLIDES (X 3 CARDS)
Fecal Occult Blood: NEGATIVE
OCCULT 3: NEGATIVE

## 2011-04-05 ENCOUNTER — Telehealth: Payer: Self-pay | Admitting: Oncology

## 2011-04-05 NOTE — Telephone Encounter (Signed)
pt called and lmovm to cancelled appt for 02/26 and she will rtn call to r/s

## 2011-04-05 NOTE — Telephone Encounter (Signed)
See previous note

## 2011-04-10 ENCOUNTER — Ambulatory Visit: Payer: Medicare Other | Admitting: Oncology

## 2011-04-11 ENCOUNTER — Other Ambulatory Visit: Payer: Self-pay | Admitting: Internal Medicine

## 2011-04-11 NOTE — Telephone Encounter (Signed)
Refill done.  

## 2011-04-30 ENCOUNTER — Encounter: Payer: Medicare Other | Admitting: Internal Medicine

## 2011-05-29 ENCOUNTER — Other Ambulatory Visit: Payer: Self-pay

## 2011-05-29 MED ORDER — FLUTICASONE-SALMETEROL 100-50 MCG/DOSE IN AEPB: 1.0000 | INHALATION_SPRAY | Freq: Two times a day (BID) | RESPIRATORY_TRACT | Status: DC

## 2011-06-18 ENCOUNTER — Encounter: Payer: Self-pay | Admitting: Internal Medicine

## 2011-06-18 ENCOUNTER — Ambulatory Visit (INDEPENDENT_AMBULATORY_CARE_PROVIDER_SITE_OTHER): Payer: Medicare Other | Admitting: Internal Medicine

## 2011-06-18 VITALS — BP 118/70 | HR 75 | Temp 98.2°F | Ht 65.0 in | Wt 111.5 lb

## 2011-06-18 DIAGNOSIS — I10 Essential (primary) hypertension: Secondary | ICD-10-CM

## 2011-06-18 DIAGNOSIS — I509 Heart failure, unspecified: Secondary | ICD-10-CM

## 2011-06-18 DIAGNOSIS — IMO0002 Reserved for concepts with insufficient information to code with codable children: Secondary | ICD-10-CM

## 2011-06-18 DIAGNOSIS — M545 Low back pain: Secondary | ICD-10-CM

## 2011-06-18 DIAGNOSIS — M5416 Radiculopathy, lumbar region: Secondary | ICD-10-CM

## 2011-06-18 MED ORDER — OXYCODONE-ACETAMINOPHEN 5-325 MG PO TABS: 1.0000 | ORAL_TABLET | Freq: Four times a day (QID) | ORAL | Status: DC | PRN

## 2011-06-18 NOTE — Assessment & Plan Note (Signed)
stable overall by hx and exam, most recent data reviewed with pt, and pt to continue medical treatment as before / BP Readings from Last 3 Encounters:  06/18/11 118/70  03/30/11 131/76  03/14/11 142/72

## 2011-06-18 NOTE — Assessment & Plan Note (Addendum)
Ok to take lasix as needed for now, volume stable and not really taking the lasix on regular basis

## 2011-06-18 NOTE — Patient Instructions (Addendum)
Please take the lasix as needed for swelling for now [facility] to stop the hydrocodone for pain OK to start the generic Percocet for pain (given hardcopy today) Continue all other medications as before Please have the pharmacy call with any refills you may need. You will be contacted regarding the referral for: MRI for the lower back, as well as orthopedic referral to [facility] Please go to LAB in the Basement for the blood and/or urine tests to be done today You will be contacted by phone if any changes need to be made immediately.  Otherwise, you will receive a letter about your results with an explanation.

## 2011-06-18 NOTE — Progress Notes (Signed)
Subjective:    Patient ID: Jean Mitchell, female    DOB: 01/12/1928, 78 y.o.   MRN: [id number]  HPI  Here to f/u;  Had EGD with dilation but she states not much improved in terms of the dysphagia; diarrhea now resolved;  Did have fall with chasing a dog in the road but seen in ER without serious injury,  Did have slight low K and chronic anemia, and gets too weak on the lasix, so often does not take.Marland Kitchen  Has occas dizziness but no other falls, feels overall weak, maybe weaker than last visit.  Pt continues to have recurring moderate to severe bilat LBP for 2 months without bowel or bladder change, fever, wt loss,   gait change or falls but pain now goes to feet, no numbness, and weakness, especially RLE.  Also with recurrent leg cramps bilat calves, and especially has to stretch the right leg when getting up in th AM to avoid falling down.  Tends to walk slower, and her son takes her in wheelchair to go anywhere as she cant keep up.  Also mentions pain to the right mid back pain, as well as pain to the post bilat lower neck with soreness worse in the AM, tends to be better in the afternon before she has better ROM of the neck.  Has recurring constipatoin, chronic for her in her life even before taking the hydrocodone chronic.  Did have a temp 100.4 2 wks ago, but none since.   Denies urinary symptoms such as dysuria, frequency, urgency,or hematuria except for very occas dysuria that comes and goes.  Hydrocodone use now going on 6 yrs, and does not seem to work well further.  She thinks advair may work better than spirirva but is using the spiriva daily.  Denies worsening depressive symptoms, suicidal ideation, or panic, though has ongoing anxiety - clonazepam seems to be helping as well.  Pt denies chest pain, increased sob or doe, wheezing, orthopnea, PND, increased LE swelling, palpitations, dizziness or syncope.  Pt denies new neurological symptoms such as new headache, or facial or extremity weakness or  numbness except for the above.   Pt denies polydipsia, polyuria.  Past Medical History  Diagnosis Date  . Breast cancer 2007    s/p XRT on Tamoxifen  . Hyperlipidemia   . CHF (congestive heart failure)   . COPD (chronic obstructive pulmonary disease)     o2 at night  . Depression   . AAA (abdominal aortic aneurysm)   . Back pain     lumbar, chronic  . Hypertension   . Osteoporosis     dexa 02-18-08  . Insomnia   . Esophageal stricture   . Adenomatous colon polyp   . Hip fracture 08/2009    R, s/p ORIF  . Arthritis   . Ulcer   . Heart disease   . Diverticulosis   . Anxiety 02/15/2011  . Impaired glucose tolerance 02/18/2011  . Internal hemorrhoids   . 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 Oophorectomy     unilateral  . Knee arthroscopy     right   . Hemorrhoid surgery 1965  . Mastectomy, partial 2007    left  . Abdominal hysterectomy 1968  . Tonsillectomy 1968  . Rotator cuff repair   . Anal carcinoma resection   . Kyphosis surgery     balloon  . Cesarean section     x 2  reports that she quit smoking about 7 years ago. She has never used smokeless tobacco. She reports that she does not drink alcohol or use illicit drugs. family history includes Breast cancer in her sister; Cancer in her mother; Colon cancer (age of onset:80) in her father; Colon polyps in her sister; and Heart disease in her mother and sister. Allergies  Allergen Reactions  . Ezetimibe-Simvastatin     REACTION: unspecified  . Gabapentin     REACTION: severe reaction, whelps   Current Outpatient Prescriptions on File Prior to Visit  Medication Sig Dispense Refill  . albuterol (PROVENTIL HFA;VENTOLIN HFA) 108 (90 BASE) MCG/ACT inhaler Inhale 2 puffs into the lungs every 6 (six) hours as needed. For shortness of breath      . citalopram (CELEXA) 20 MG tablet TAKE 1 AND 1/2 TABLETS ONCE A DAY  45 tablet  3  . clonazePAM (KLONOPIN) 0.5 MG tablet Take 0.5 mg  by mouth 3 (three) times daily as needed. For anxiety      . fluticasone (FLONASE) 50 MCG/ACT nasal spray Place 2 sprays into the nose daily.      . Fluticasone-Salmeterol (ADVAIR) 100-50 MCG/DOSE AEPB Inhale 1 puff into the lungs every 12 (twelve) hours.  60 each  6  . furosemide (LASIX) 20 MG tablet Take 20 mg by mouth every Monday, Wednesday, and Friday.      Marland Kitchen HYDROcodone-acetaminophen (NORCO) 10-325 MG per tablet Take 1 tablet by mouth 4 (four) times daily as needed. For pain      . ibuprofen (ADVIL,MOTRIN) 200 MG tablet Take 200 mg by mouth 2 (two) times daily.        . potassium chloride (K-DUR) 10 MEQ tablet Take 1 tablet (10 mEq total) by mouth daily.  5 tablet  0  . pravastatin (PRAVACHOL) 40 MG tablet Take 40 mg by mouth daily.      . pravastatin (PRAVACHOL) 40 MG tablet TAKE 1 TABLET BY MOUTH DAILY.  30 tablet  3  . tiotropium (SPIRIVA) 18 MCG inhalation capsule Place 18 mcg into inhaler and inhale daily.      . vitamin B-12 (CYANOCOBALAMIN) 100 MCG tablet Take 50 mcg by mouth daily.        Marland Kitchen VITAMIN D, CHOLECALCIFEROL, PO Take 1 tablet by mouth daily.       Marland Kitchen albuterol (PROVENTIL) (2.5 MG/3ML) 0.083% nebulizer solution Take 2.5 mg by nebulization 4 (four) times daily.       Current Facility-Administered Medications on File Prior to Visit  Medication Dose Route Frequency Provider Last Rate Last Dose  . 0.9 %  sodium chloride infusion  500 mL Intravenous Continuous Hart Carwin, MD       Review of Systems Review of Systems  Constitutional: Negative for diaphoresis and unexpected weight change.  HENT: Negative for drooling and tinnitus.   Eyes: Negative for photophobia and visual disturbance.  Respiratory: Negative for choking and stridor.   Gastrointestinal: Negative for vomiting and blood in stool.  Genitourinary: Negative for hematuria and decreased urine volume.  Skin: Negative for color change and wound.  Neurological: Negative for tremors and numbness.    Psychiatric/Behavioral: Negative for decreased concentration. The patient is not hyperactive.      Objective:   Physical Exam BP 118/70  Pulse 75  Temp(Src) 98.2 F (36.8 C) (Oral)  Ht 5\' 5"  (1.651 m)  Wt 111 lb 8 oz (50.576 kg)  BMI 18.55 kg/m2  SpO2 92% Physical Exam  VS noted Constitutional: Pt appears well-developed  and well-nourished.  HENT: Head: Normocephalic.  Right Ear: External ear normal.  Left Ear: External ear normal.  Eyes: Conjunctivae and EOM are normal. Pupils are equal, round, and reactive to light.  Neck: Normal range of motion. Neck supple.  Cardiovascular: Normal rate and regular rhythm.   Pulmonary/Chest: Effort normal and breath sounds normal.  Abd:  Soft, NT, non-distended, + BS, no flank tender Spine mod tender midline low lumbar without bilat paravertebral spasm, no thoracic or cervical tender or paravertebral spasm Neurological: Pt is alert. Not confused  LE motor 4+/5 distal LLE o/w 5/5, and sens/dtr intact Skin: Skin is warm. No erythema. No rash Psychiatric: Pt behavior is normal. Thought content normal. 1+ nervous    Assessment & Plan:

## 2011-06-18 NOTE — Assessment & Plan Note (Addendum)
Worsening x 6-8 wks now with RLE weakness on exam, for MRI lumbar and orthopedic referral  Note: time for pt history and exam, review of record with pt in room, determination of diagnosis and plan for further eval and tx is > 40 min

## 2011-06-18 NOTE — Assessment & Plan Note (Signed)
Ok to change to percocet prn; Done hardcopy

## 2011-06-19 ENCOUNTER — Encounter: Payer: Self-pay | Admitting: Internal Medicine

## 2011-06-19 ENCOUNTER — Other Ambulatory Visit (INDEPENDENT_AMBULATORY_CARE_PROVIDER_SITE_OTHER): Payer: Medicare Other

## 2011-06-19 DIAGNOSIS — I10 Essential (primary) hypertension: Secondary | ICD-10-CM

## 2011-06-19 LAB — BASIC METABOLIC PANEL
CO2: 32 mEq/L (ref 19–32)
Calcium: 9.5 mg/dL (ref 8.4–10.5)
Glucose, Bld: 87 mg/dL (ref 70–99)
Potassium: 4.9 mEq/L (ref 3.5–5.1)
Sodium: 140 mEq/L (ref 135–145)

## 2011-06-19 LAB — HEPATIC FUNCTION PANEL
AST: 21 U/L (ref 0–37)
Albumin: 3.9 g/dL (ref 3.5–5.2)
Alkaline Phosphatase: 49 U/L (ref 39–117)
Bilirubin, Direct: 0.1 mg/dL (ref 0.0–0.3)
Total Protein: 6.9 g/dL (ref 6.0–8.3)

## 2011-06-19 LAB — CBC WITH DIFFERENTIAL/PLATELET
Basophils Absolute: 0 10*3/uL (ref 0.0–0.1)
Eosinophils Absolute: 0.2 10*3/uL (ref 0.0–0.7)
HCT: 36.1 % (ref 36.0–46.0)
Hemoglobin: 11.8 g/dL — ABNORMAL LOW (ref 12.0–15.0)
Lymphocytes Relative: 26.7 % (ref 12.0–46.0)
Lymphs Abs: 1.6 10*3/uL (ref 0.7–4.0)
MCHC: 32.6 g/dL (ref 30.0–36.0)
Neutro Abs: 3.6 10*3/uL (ref 1.4–7.7)
Platelets: 254 10*3/uL (ref 150.0–400.0)
RDW: 14 % (ref 11.5–14.6)

## 2011-06-19 LAB — URINALYSIS, ROUTINE W REFLEX MICROSCOPIC
Bilirubin Urine: NEGATIVE
Ketones, ur: NEGATIVE
Leukocytes, UA: NEGATIVE
Nitrite: NEGATIVE
Urobilinogen, UA: 0.2 (ref 0.0–1.0)

## 2011-06-21 ENCOUNTER — Telehealth: Payer: Self-pay

## 2011-06-21 MED ORDER — HYDROCODONE-ACETAMINOPHEN 10-325 MG PO TABS: 1.0000 | ORAL_TABLET | Freq: Four times a day (QID) | ORAL | Status: AC | PRN

## 2011-06-21 NOTE — Telephone Encounter (Signed)
Patient returned bottle of medication and gave to MD. Faxed hardcopy of new medication to pharmacy.

## 2011-06-21 NOTE — Telephone Encounter (Signed)
Done hardcopy to robin  But pt needs to bring in the unused percocet to exchange for new rx

## 2011-06-21 NOTE — Telephone Encounter (Signed)
Pt called stating that pain medication Percocet is causing severe nausea and vomiting. Pt is requesting to have her medication changed back to hydrocodone, please advise.

## 2011-06-21 NOTE — Telephone Encounter (Signed)
Called the patient left message to call back 

## 2011-06-21 NOTE — Telephone Encounter (Signed)
Ca;;ed the patient informed of MD's instructions.  Patient stated she will come to the office today 06/21/11 and return unused bottle in return for rx. Prescription will be on Dr. Raphael Gibney CMA's desk waiting for the patient to arrive.

## 2011-06-22 ENCOUNTER — Other Ambulatory Visit: Payer: Self-pay | Admitting: *Deleted

## 2011-06-22 MED ORDER — ALBUTEROL SULFATE HFA 108 (90 BASE) MCG/ACT IN AERS: 2.0000 | INHALATION_SPRAY | Freq: Four times a day (QID) | RESPIRATORY_TRACT | Status: DC | PRN

## 2011-06-22 NOTE — Telephone Encounter (Signed)
R'cd fax from [facility] for Proair refill.

## 2011-06-27 ENCOUNTER — Ambulatory Visit
Admission: RE | Admit: 2011-06-27 | Discharge: 2011-06-27 | Disposition: A | Discharge status: Home / Self Care | Payer: Medicare Other | Source: Ambulatory Visit | Attending: Internal Medicine | Admitting: Internal Medicine

## 2011-06-27 ENCOUNTER — Encounter: Payer: Self-pay | Admitting: Internal Medicine

## 2011-06-27 DIAGNOSIS — M5416 Radiculopathy, lumbar region: Secondary | ICD-10-CM

## 2011-07-11 ENCOUNTER — Other Ambulatory Visit: Payer: Self-pay

## 2011-07-11 MED ORDER — CITALOPRAM HYDROBROMIDE 20 MG PO TABS: ORAL_TABLET | ORAL | Status: DC

## 2011-07-19 ENCOUNTER — Other Ambulatory Visit: Payer: Self-pay

## 2011-07-19 MED ORDER — CLONAZEPAM 0.5 MG PO TABS: 0.5000 mg | ORAL_TABLET | Freq: Three times a day (TID) | ORAL | Status: DC | PRN

## 2011-07-19 NOTE — Telephone Encounter (Signed)
Done hardcopy to robin  

## 2011-07-19 NOTE — Telephone Encounter (Signed)
Faxed hardcopy to pharmacy. 

## 2011-08-07 ENCOUNTER — Other Ambulatory Visit: Payer: Self-pay

## 2011-08-07 MED ORDER — HYDROCODONE-ACETAMINOPHEN 10-325 MG PO TABS: 1.0000 | ORAL_TABLET | Freq: Four times a day (QID) | ORAL | Status: AC | PRN

## 2011-08-07 MED ORDER — PRAVASTATIN SODIUM 40 MG PO TABS: 40.0000 mg | ORAL_TABLET | Freq: Every day | ORAL | Status: DC

## 2011-08-07 NOTE — Telephone Encounter (Signed)
Done hardcopy to robin  

## 2011-08-07 NOTE — Telephone Encounter (Signed)
Faxed hardcopy to pharmacy. 

## 2011-08-09 ENCOUNTER — Telehealth: Payer: Self-pay

## 2011-08-09 MED ORDER — CLONAZEPAM 0.5 MG PO TABS: 0.5000 mg | ORAL_TABLET | Freq: Three times a day (TID) | ORAL | Status: DC | PRN

## 2011-08-09 NOTE — Telephone Encounter (Signed)
Called informed the pharmacist of MD's instructions on medication directions.

## 2011-08-09 NOTE — Telephone Encounter (Signed)
Pharmacy states per patient that she takes 1 clonazepam every 6-8 hours and 3 tab. At bedtime, if correct instructions they will need a new rx.  Please advise

## 2011-08-09 NOTE — Telephone Encounter (Signed)
This appears to be incorrect  I have reivewed EMR and [place] controlled substance  In the past 6 mo, her rx has been always klonopin .5 mg tid prn only

## 2011-09-11 ENCOUNTER — Telehealth: Payer: Self-pay

## 2011-09-11 ENCOUNTER — Ambulatory Visit: Payer: Medicare Other | Admitting: Cardiology

## 2011-09-11 MED ORDER — HYDROCODONE-ACETAMINOPHEN 10-325 MG PO TABS: 1.0000 | ORAL_TABLET | Freq: Four times a day (QID) | ORAL | Status: AC | PRN

## 2011-09-11 NOTE — Telephone Encounter (Signed)
Pharmacy requesting refill on hydrocodone APAP 10/325 1 po q 6 hrs. Prn.

## 2011-09-11 NOTE — Telephone Encounter (Signed)
Faxed hardcopy to pharmacy.  Called the patient left message to call back

## 2011-09-11 NOTE — Telephone Encounter (Signed)
Ok for med - Done hardcopy to robin  BUT  - has she seen ortho as referred in may?

## 2011-09-11 NOTE — Telephone Encounter (Signed)
Called the patient and she did see the ortho.  She stated he did nothing to help her.  He wanted to do an injection in her back, but she stated she had an allergic reaction to in the past.  Her back pain has increased.

## 2011-09-12 ENCOUNTER — Other Ambulatory Visit: Payer: Self-pay

## 2011-09-12 MED ORDER — FLUTICASONE-SALMETEROL 100-50 MCG/DOSE IN AEPB: 1.0000 | INHALATION_SPRAY | Freq: Two times a day (BID) | RESPIRATORY_TRACT | Status: DC

## 2011-09-21 ENCOUNTER — Other Ambulatory Visit: Payer: Self-pay

## 2011-09-21 MED ORDER — FLUTICASONE PROPIONATE 50 MCG/ACT NA SUSP: 2.0000 | Freq: Every day | NASAL | Status: DC

## 2011-09-25 ENCOUNTER — Other Ambulatory Visit: Payer: Self-pay | Admitting: Internal Medicine

## 2011-10-10 ENCOUNTER — Other Ambulatory Visit: Payer: Self-pay

## 2011-10-10 MED ORDER — HYDROCODONE-ACETAMINOPHEN 10-325 MG PO TABS: 1.0000 | ORAL_TABLET | Freq: Four times a day (QID) | ORAL | Status: DC | PRN

## 2011-10-10 NOTE — Telephone Encounter (Signed)
Faxed hardcopy to pharmacy. 

## 2011-10-10 NOTE — Telephone Encounter (Signed)
Done hardcopy to robin  

## 2011-10-11 ENCOUNTER — Ambulatory Visit: Payer: Medicare Other | Admitting: Cardiology

## 2011-10-22 ENCOUNTER — Ambulatory Visit (INDEPENDENT_AMBULATORY_CARE_PROVIDER_SITE_OTHER): Payer: Medicare Other | Admitting: Internal Medicine

## 2011-10-22 ENCOUNTER — Other Ambulatory Visit (INDEPENDENT_AMBULATORY_CARE_PROVIDER_SITE_OTHER): Payer: Medicare Other

## 2011-10-22 ENCOUNTER — Encounter: Payer: Self-pay | Admitting: Internal Medicine

## 2011-10-22 VITALS — BP 104/70 | HR 78 | Temp 97.4°F | Ht 65.0 in | Wt 113.4 lb

## 2011-10-22 DIAGNOSIS — R1310 Dysphagia, unspecified: Secondary | ICD-10-CM

## 2011-10-22 DIAGNOSIS — R7309 Other abnormal glucose: Secondary | ICD-10-CM

## 2011-10-22 DIAGNOSIS — M810 Age-related osteoporosis without current pathological fracture: Secondary | ICD-10-CM

## 2011-10-22 DIAGNOSIS — R7302 Impaired glucose tolerance (oral): Secondary | ICD-10-CM

## 2011-10-22 DIAGNOSIS — I714 Abdominal aortic aneurysm, without rupture: Secondary | ICD-10-CM

## 2011-10-22 DIAGNOSIS — R109 Unspecified abdominal pain: Secondary | ICD-10-CM

## 2011-10-22 DIAGNOSIS — I509 Heart failure, unspecified: Secondary | ICD-10-CM

## 2011-10-22 DIAGNOSIS — G8929 Other chronic pain: Secondary | ICD-10-CM

## 2011-10-22 DIAGNOSIS — Z23 Encounter for immunization: Secondary | ICD-10-CM

## 2011-10-22 LAB — CBC WITH DIFFERENTIAL/PLATELET
Basophils Absolute: 0 10*3/uL (ref 0.0–0.1)
Eosinophils Relative: 1.6 % (ref 0.0–5.0)
Hemoglobin: 11.6 g/dL — ABNORMAL LOW (ref 12.0–15.0)
Lymphocytes Relative: 24.1 % (ref 12.0–46.0)
Monocytes Relative: 12.6 % — ABNORMAL HIGH (ref 3.0–12.0)
Neutro Abs: 3.8 10*3/uL (ref 1.4–7.7)
RBC: 3.66 Mil/uL — ABNORMAL LOW (ref 3.87–5.11)
RDW: 14 % (ref 11.5–14.6)
WBC: 6.2 10*3/uL (ref 4.5–10.5)

## 2011-10-22 LAB — SEDIMENTATION RATE: Sed Rate: 33 mm/hr — ABNORMAL HIGH (ref 0–22)

## 2011-10-22 LAB — URINALYSIS, ROUTINE W REFLEX MICROSCOPIC
Ketones, ur: NEGATIVE
Leukocytes, UA: NEGATIVE
Nitrite: NEGATIVE
Specific Gravity, Urine: 1.01 (ref 1.000–1.030)
pH: 8 (ref 5.0–8.0)

## 2011-10-22 LAB — HEPATIC FUNCTION PANEL
Albumin: 4.4 g/dL (ref 3.5–5.2)
Alkaline Phosphatase: 56 U/L (ref 39–117)
Total Protein: 7.2 g/dL (ref 6.0–8.3)

## 2011-10-22 LAB — LIPASE: Lipase: 19 U/L (ref 11.0–59.0)

## 2011-10-22 LAB — BASIC METABOLIC PANEL
CO2: 33 mEq/L — ABNORMAL HIGH (ref 19–32)
Calcium: 9.9 mg/dL (ref 8.4–10.5)
Chloride: 99 mEq/L (ref 96–112)
Glucose, Bld: 95 mg/dL (ref 70–99)
Potassium: 3.9 mEq/L (ref 3.5–5.1)
Sodium: 140 mEq/L (ref 135–145)

## 2011-10-22 MED ORDER — TRAMADOL HCL 50 MG PO TABS: 50.0000 mg | ORAL_TABLET | Freq: Four times a day (QID) | ORAL | Status: DC | PRN

## 2011-10-22 MED ORDER — PRAVASTATIN SODIUM 40 MG PO TABS: 40.0000 mg | ORAL_TABLET | Freq: Every day | ORAL | Status: AC

## 2011-10-22 NOTE — Patient Instructions (Addendum)
You had the flu shot today We will look into getting the Prolia for you for the osteoporosis; you should be called in 1-2 wks about the copay (if any) for this medication OK to stop the hydrocodone Please start the tramadol for pain as needed You will be contacted regarding the referral for: GI (Dr Juanda Chance) Please go to LAB in the [facility] for the blood and/or urine tests to be done today You will be contacted regarding the referral for: Abdomen ultrasound, which should also include the aortic aneurysm Please start Miralax every day (OTC) - 17 gm in water every day Continue all other medications as before, including the pravachol, but this may need to be stopped if the constipation does not improve Please return in 4 months, or sooner if needed

## 2011-10-22 NOTE — Progress Notes (Signed)
Subjective:    Patient ID: Jean Mitchell, female    DOB: 03/06/1927, 78 y.o.   MRN: [id number]  HPI  Here to f/u, overall doing ok, has not taken the lasix 20 mg m-w-f b/c felt weakened for over a month and Pt denies chest pain, increased sob or doe, wheezing, orthopnea, PND, increased LE swelling, palpitations, dizziness or syncope.    Has seen Dr Marlowe Shores, with EGD and colonoscopy jan 2013;  Now with worsening symptoms with dysphagia to solids, including breads.  No vomiting but "food comes back" after drink fluids, only choked severe one time in the past mo, no recurrent aspiration.  Has recurren abd pain/crampy assoc with constipation it seems without n/v and Denies worsening reflux, or blood.    Has osteoporosis, hx of hip fx.  Had reclast IV x 2 yrs, does not want to do further.    Has known spinal stenosis, has declined ESI tx per ortho to date and Pt continues to have recurring LBP without change in severity, bowel or bladder change, fever, wt loss,  worsening LE pain/numbness/weakness, gait change or falls.  Has known AAA, no recent f/u.  Pt denies new neurological symptoms such as new headache, or facial or extremity weakness or numbness   Pt denies polydipsia, polyuria. Past Medical History  Diagnosis Date  . Breast cancer 2007    s/p XRT on Tamoxifen  . Hyperlipidemia   . CHF (congestive heart failure)   . COPD (chronic obstructive pulmonary disease)     o2 at night  . Depression   . AAA (abdominal aortic aneurysm)   . Back pain     lumbar, chronic  . Hypertension   . Osteoporosis     dexa 02-18-08  . Insomnia   . Esophageal stricture   . Adenomatous colon polyp   . Hip fracture 08/2009    R, s/p ORIF  . Arthritis   . Ulcer   . Heart disease   . Diverticulosis   . Anxiety 02/15/2011  . Impaired glucose tolerance 02/18/2011  . Internal hemorrhoids   . 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 Oophorectomy     unilateral  . Knee  arthroscopy     right   . Hemorrhoid surgery 1965  . Mastectomy, partial 2007    left  . Abdominal hysterectomy 1968  . Tonsillectomy 1968  . Rotator cuff repair   . Anal carcinoma resection   . Kyphosis surgery     balloon  . Cesarean section     x 2    reports that she quit smoking about 7 years ago. She has never used smokeless tobacco. She reports that she does not drink alcohol or use illicit drugs. family history includes Breast cancer in her sister; Cancer in her mother; Colon cancer (age of onset:80) in her father; Colon polyps in her sister; and Heart disease in her mother and sister. Allergies  Allergen Reactions  . Ezetimibe-Simvastatin     REACTION: unspecified  . Gabapentin     REACTION: severe reaction, whelps  . Percocet (Oxycodone-Acetaminophen) Nausea And Vomiting    5/325 mg   Current Outpatient Prescriptions on File Prior to Visit  Medication Sig Dispense Refill  . albuterol (PROVENTIL HFA;VENTOLIN HFA) 108 (90 BASE) MCG/ACT inhaler Inhale 2 puffs into the lungs every 6 (six) hours as needed. For shortness of breath  8.5 g  2  . citalopram (CELEXA) 20 MG tablet TAKE 1 AND  1/2 TABLETS ONCE A DAY  45 tablet  3  . clonazePAM (KLONOPIN) 0.5 MG tablet Take 1 tablet (0.5 mg total) by mouth 3 (three) times daily as needed. For anxiety, to fill August 17, 2011  90 tablet  1  . fluticasone (FLONASE) 50 MCG/ACT nasal spray Place 2 sprays into the nose daily.  16 g  6  . Fluticasone-Salmeterol (ADVAIR) 100-50 MCG/DOSE AEPB Inhale 1 puff into the lungs every 12 (twelve) hours.  60 each  6  . ibuprofen (ADVIL,MOTRIN) 200 MG tablet Take 200 mg by mouth 2 (two) times daily.        . potassium chloride (K-DUR) 10 MEQ tablet Take 1 tablet (10 mEq total) by mouth daily.  5 tablet  0  . pravastatin (PRAVACHOL) 40 MG tablet Take 1 tablet (40 mg total) by mouth daily.  30 tablet  11  . tiotropium (SPIRIVA) 18 MCG inhalation capsule Place 18 mcg into inhaler and inhale daily.      .  vitamin B-12 (CYANOCOBALAMIN) 100 MCG tablet Take 50 mcg by mouth daily.        Marland Kitchen VITAMIN D, CHOLECALCIFEROL, PO Take 1 tablet by mouth daily.       Marland Kitchen albuterol (PROVENTIL) (2.5 MG/3ML) 0.083% nebulizer solution Take 2.5 mg by nebulization 4 (four) times daily.       Review of Systems  Constitutional: Negative for diaphoresis and unexpected weight change.  HENT: Negative for tinnitus.   Eyes: Negative for photophobia and visual disturbance.  Respiratory: Negative for choking and stridor.   Gastrointestinal: Negative for vomiting and blood in stool.  Genitourinary: Negative for hematuria and decreased urine volume.  Musculoskeletal: Negative for gait problem.  Skin: Negative for color change and wound.  Neurological: Negative for tremors and numbness.  Psychiatric/Behavioral: Negative for decreased concentration. The patient is not hyperactive.      Objective:   Physical Exam BP 104/70  Pulse 78  Temp 97.4 F (36.3 C) (Oral)  Ht 5\' 5"  (1.651 m)  Wt 113 lb 6 oz (51.427 kg)  BMI 18.87 kg/m2  SpO2 94% Physical Exam  VS noted Constitutional: Pt appears well-developed and well-nourished.  HENT: Head: Normocephalic.  Right Ear: External ear normal.  Left Ear: External ear normal.  Eyes: Conjunctivae and EOM are normal. Pupils are equal, round, and reactive to light.  Neck: Normal range of motion. Neck supple.  Cardiovascular: Normal rate and regular rhythm.   Pulmonary/Chest: Effort normal and breath sounds normal.  Abd:  Soft, NT, non-distended, + BS, benign exam Neurological: Pt is alert. Not confused ; spine with mild low mid lumbar tender, no swelling or rash Skin: Skin is warm. No erythema.  No rash    Assessment & Plan:

## 2011-10-22 NOTE — Assessment & Plan Note (Signed)
For f/u with abd u/s ordered today

## 2011-10-23 ENCOUNTER — Encounter: Payer: Self-pay | Admitting: Internal Medicine

## 2011-10-23 ENCOUNTER — Ambulatory Visit
Admission: RE | Admit: 2011-10-23 | Discharge: 2011-10-23 | Disposition: A | Discharge status: Home / Self Care | Payer: Medicare Other | Source: Ambulatory Visit | Attending: Internal Medicine | Admitting: Internal Medicine

## 2011-10-23 DIAGNOSIS — R109 Unspecified abdominal pain: Secondary | ICD-10-CM

## 2011-10-28 ENCOUNTER — Encounter: Payer: Self-pay | Admitting: Internal Medicine

## 2011-10-28 DIAGNOSIS — R1310 Dysphagia, unspecified: Secondary | ICD-10-CM | POA: Insufficient documentation

## 2011-10-28 NOTE — Assessment & Plan Note (Signed)
Unclear etiology, for GI referral 

## 2011-10-28 NOTE — Assessment & Plan Note (Signed)
Recent stable, I suspect related to decreased po intake including fluids, cont off the lasix for now

## 2011-10-28 NOTE — Assessment & Plan Note (Addendum)
?   MSK vs constipation vs other - for abd u/s ; to try OTC daily miralax for constipation, cont statin for now  Note:  Total time for pt hx, exam, review of record with pt in the room, determination of diagnoses and plan for further eval and tx is > 40 min, with over 50% spent in coordination and counseling of patient

## 2011-10-28 NOTE — Assessment & Plan Note (Signed)
Ok to try change hydrocodone to tramadol , ? Help with constipation

## 2011-10-28 NOTE — Assessment & Plan Note (Signed)
Pt decline further reclast, we will look into prolia for her

## 2011-10-28 NOTE — Assessment & Plan Note (Signed)
stable overall by hx and exam, most recent data reviewed with pt, and pt to continue medical treatment as before Lab Results  Component Value Date   HGBA1C 5.5 10/22/2011

## 2011-10-29 ENCOUNTER — Telehealth: Payer: Self-pay | Admitting: *Deleted

## 2011-10-29 MED ORDER — CLONAZEPAM 0.5 MG PO TABS: 0.5000 mg | ORAL_TABLET | Freq: Three times a day (TID) | ORAL | Status: DC | PRN

## 2011-10-29 MED ORDER — HYDROCODONE-ACETAMINOPHEN 5-325 MG PO TABS: 1.0000 | ORAL_TABLET | Freq: Four times a day (QID) | ORAL | Status: DC | PRN

## 2011-10-29 NOTE — Telephone Encounter (Signed)
Pt called several times on triage to discuss changing pain medication. She states that Tramadol given to her at last OV is not helping her back pain and that she wants another medication if possible.

## 2011-10-29 NOTE — Telephone Encounter (Signed)
Faxed hardcopy of both prescriptions to pharmacy.

## 2011-10-29 NOTE — Telephone Encounter (Signed)
Done hardcopy to robin  

## 2011-11-05 ENCOUNTER — Ambulatory Visit (INDEPENDENT_AMBULATORY_CARE_PROVIDER_SITE_OTHER): Payer: Medicare Other | Admitting: Cardiology

## 2011-11-05 ENCOUNTER — Encounter: Payer: Self-pay | Admitting: Cardiology

## 2011-11-05 ENCOUNTER — Encounter: Payer: Self-pay | Admitting: Internal Medicine

## 2011-11-05 VITALS — BP 120/69 | HR 84 | Wt 110.0 lb

## 2011-11-05 DIAGNOSIS — I350 Nonrheumatic aortic (valve) stenosis: Secondary | ICD-10-CM

## 2011-11-05 DIAGNOSIS — E785 Hyperlipidemia, unspecified: Secondary | ICD-10-CM

## 2011-11-05 DIAGNOSIS — I359 Nonrheumatic aortic valve disorder, unspecified: Secondary | ICD-10-CM

## 2011-11-05 DIAGNOSIS — I714 Abdominal aortic aneurysm, without rupture: Secondary | ICD-10-CM

## 2011-11-05 DIAGNOSIS — I10 Essential (primary) hypertension: Secondary | ICD-10-CM

## 2011-11-05 NOTE — Progress Notes (Signed)
HPI: Pleasant female previously followed by Dr. Juanda Chance for diastolic congestive heart failure. Last nuclear study was performed in September 2010 and showed an ejection fraction of 63% and normal perfusion. Echocardiogram in July of 2011 showed normal LV function and mild aortic stenosis with a mean gradient of 10 mm of mercury. Abdominal ultrasound in Sept 2013 showed an aneurysm at 3 cm. She was last seen in May 2012. Since then, the patient has dyspnea with more extreme activities but not with routine activities. It is relieved with rest. It is not associated with chest pain. There is no orthopnea, PND or pedal edema. There is no syncope or palpitations. There is no exertional chest pain.   Current Outpatient Prescriptions  Medication Sig Dispense Refill  . albuterol (PROVENTIL HFA;VENTOLIN HFA) 108 (90 BASE) MCG/ACT inhaler Inhale 2 puffs into the lungs every 6 (six) hours as needed. For shortness of breath  8.5 g  2  . citalopram (CELEXA) 20 MG tablet TAKE 1 AND 1/2 TABLETS ONCE A DAY  45 tablet  3  . clonazePAM (KLONOPIN) 0.5 MG tablet Take 1 tablet (0.5 mg total) by mouth 3 (three) times daily as needed.  90 tablet  1  . fluticasone (FLONASE) 50 MCG/ACT nasal spray Place 2 sprays into the nose daily.  16 g  6  . Fluticasone-Salmeterol (ADVAIR) 100-50 MCG/DOSE AEPB Inhale 1 puff into the lungs every 12 (twelve) hours.  60 each  6  . HYDROcodone-acetaminophen (NORCO/VICODIN) 5-325 MG per tablet Take 1 tablet by mouth every 6 (six) hours as needed for pain.  120 tablet  2  . ibuprofen (ADVIL,MOTRIN) 200 MG tablet Take 200 mg by mouth 2 (two) times daily.        . potassium chloride (K-DUR) 10 MEQ tablet Take 1 tablet (10 mEq total) by mouth daily.  5 tablet  0  . pravastatin (PRAVACHOL) 40 MG tablet Take 1 tablet (40 mg total) by mouth daily.  30 tablet  11  . tiotropium (SPIRIVA) 18 MCG inhalation capsule Place 18 mcg into inhaler and inhale daily.      . vitamin B-12 (CYANOCOBALAMIN) 100 MCG  tablet Take 50 mcg by mouth daily.        Marland Kitchen VITAMIN D, CHOLECALCIFEROL, PO Take 1 tablet by mouth daily.       Marland Kitchen DISCONTD: albuterol (PROVENTIL) (2.5 MG/3ML) 0.083% nebulizer solution Take 2.5 mg by nebulization 4 (four) times daily.         Past Medical History  Diagnosis Date  . Breast cancer 2007    s/p XRT on Tamoxifen  . Hyperlipidemia   . CHF (congestive heart failure)   . COPD (chronic obstructive pulmonary disease)     o2 at night  . Depression   . AAA (abdominal aortic aneurysm)   . Back pain     lumbar, chronic  . Hypertension   . Osteoporosis     dexa 02-18-08  . Insomnia   . Esophageal stricture   . Adenomatous colon polyp   . Hip fracture 08/2009    R, s/p ORIF  . Arthritis   . Ulcer   . Heart disease   . Diverticulosis   . Anxiety 02/15/2011  . Impaired glucose tolerance 02/18/2011  . Internal hemorrhoids   . 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 Oophorectomy     unilateral  . Knee arthroscopy     right   . Hemorrhoid surgery 1965  .  Mastectomy, partial 2007    left  . Abdominal hysterectomy 1968  . Tonsillectomy 1968  . Rotator cuff repair   . Anal carcinoma resection   . Kyphosis surgery     balloon  . Cesarean section     x 2    History   Social History  . Marital Status: Divorced    Spouse Name: N/A    Number of Children: 2  . Years of Education: N/A   Occupational History  . retired    Social History Main Topics  . Smoking status: Former Smoker    Quit date: 02/13/2004  . Smokeless tobacco: Never Used  . Alcohol Use: No  . Drug Use: No  . Sexually Active: Not on file   Other Topics Concern  . Not on file   Social History Narrative   Lives by [employer] brings food, limited ADL since 7/11--- still drives     ROS: some fatigue but no fevers or chills, productive cough, hemoptysis, dysphasia, odynophagia, melena, hematochezia, dysuria, hematuria, rash, seizure activity, orthopnea,  PND, pedal edema, claudication. Remaining systems are negative.  Physical Exam: Well-developed frail in no acute distress.  Skin is warm and dry.  HEENT is normal.  Neck is supple. Chest is clear to auscultation with normal expansion.  Cardiovascular exam is regular rate and rhythm.  Abdominal exam nontender or distended. No masses palpated. Extremities show no edema. neuro grossly intact  ECG sinus rhythm at a rate of 84. Normal axis. Nonspecific ST changes.

## 2011-11-05 NOTE — Assessment & Plan Note (Signed)
Continue statin. Lipids and liver monitored by primary care. 

## 2011-11-05 NOTE — Assessment & Plan Note (Signed)
Plan followup ultrasound of her abdomen in September 2014.

## 2011-11-05 NOTE — Assessment & Plan Note (Signed)
Blood pressure controlled. Continue present medications. 

## 2011-11-05 NOTE — Assessment & Plan Note (Signed)
Plan repeat echocardiogram when she returns in one year. 

## 2011-11-05 NOTE — Patient Instructions (Addendum)
Your physician wants you to follow-up in: 1 YEAR.  You will receive a reminder letter in the mail two months in advance. If you don't receive a letter, please call our office to schedule the follow-up appointment.  Your physician recommends that you continue on your current medications as directed. Please refer to the Current Medication list given to you today.  

## 2011-11-20 ENCOUNTER — Other Ambulatory Visit: Payer: Self-pay | Admitting: Internal Medicine

## 2011-11-30 ENCOUNTER — Encounter: Payer: Self-pay | Admitting: *Deleted

## 2011-12-19 ENCOUNTER — Other Ambulatory Visit: Payer: Self-pay

## 2011-12-19 MED ORDER — CLONAZEPAM 0.5 MG PO TABS: 0.5000 mg | ORAL_TABLET | Freq: Three times a day (TID) | ORAL | Status: DC | PRN

## 2011-12-19 NOTE — Telephone Encounter (Signed)
Faxed hardcopy to pharmacy. 

## 2011-12-19 NOTE — Telephone Encounter (Signed)
Done hardcopy to robin  

## 2011-12-25 ENCOUNTER — Ambulatory Visit: Payer: Medicare Other | Admitting: Internal Medicine

## 2012-01-08 ENCOUNTER — Encounter: Payer: Self-pay | Admitting: *Deleted

## 2012-01-16 ENCOUNTER — Telehealth: Payer: Self-pay

## 2012-01-16 MED ORDER — HYDROCODONE-ACETAMINOPHEN 5-325 MG PO TABS: 1.0000 | ORAL_TABLET | Freq: Four times a day (QID) | ORAL | Status: DC | PRN

## 2012-01-16 NOTE — Telephone Encounter (Signed)
Patient informing Tramadol is not controlling her pain.  Please advise

## 2012-01-16 NOTE — Telephone Encounter (Signed)
Faxed hardcopy to pharmacy. 

## 2012-01-16 NOTE — Telephone Encounter (Signed)
Only other option would be prn hydrocodone - Done hardcopy to robin

## 2012-01-21 ENCOUNTER — Other Ambulatory Visit: Payer: Self-pay | Admitting: Internal Medicine

## 2012-02-01 ENCOUNTER — Ambulatory Visit (INDEPENDENT_AMBULATORY_CARE_PROVIDER_SITE_OTHER): Payer: Medicare Other | Admitting: Internal Medicine

## 2012-02-01 ENCOUNTER — Encounter: Payer: Self-pay | Admitting: Internal Medicine

## 2012-02-01 VITALS — BP 112/72 | HR 96 | Ht 65.0 in | Wt 111.2 lb

## 2012-02-01 DIAGNOSIS — K224 Dyskinesia of esophagus: Secondary | ICD-10-CM

## 2012-02-01 DIAGNOSIS — R1310 Dysphagia, unspecified: Secondary | ICD-10-CM

## 2012-02-01 MED ORDER — OMEPRAZOLE 20 MG PO CPDR: 20.0000 mg | DELAYED_RELEASE_CAPSULE | Freq: Every day | ORAL | Status: DC

## 2012-02-01 NOTE — Patient Instructions (Addendum)
You have been scheduled for a Barium Esophogram at [facility] (1st floor of the hospital) on 02/18/12 at 9:30 am. Please arrive 15 minutes prior to your appointment for registration. Make certain not to have anything to eat or drink 6 hours prior to your test. If you need to reschedule for any reason, please contact radiology at [phone] to do so. __________________________________________________________________ A barium swallow is an examination that concentrates on views of the esophagus. This tends to be a double contrast exam (barium and two liquids which, when combined, create a gas to distend the wall of the oesophagus) or single contrast (non-ionic iodine based). The study is usually tailored to your symptoms so a good history is essential. Attention is paid during the study to the form, structure and configuration of the esophagus, looking for functional disorders (such as aspiration, dysphagia, achalasia, motility and reflux) EXAMINATION You may be asked to change into a gown, depending on the type of swallow being performed. A radiologist and radiographer will perform the procedure. The radiologist will advise you of the type of contrast selected for your procedure and direct you during the exam. You will be asked to stand, sit or lie in several different positions and to hold a small amount of fluid in your mouth before being asked to swallow while the imaging is performed .In some instances you may be asked to swallow barium coated marshmallows to assess the motility of a solid food bolus. The exam can be recorded as a digital or video fluoroscopy procedure. POST PROCEDURE It will take 1-2 days for the barium to pass through your system. To facilitate this, it is important, unless otherwise directed, to increase your fluids for the next 24-48hrs and to resume your normal diet.  This test typically takes about 30 minutes to  perform. __________________________________________________________________________________  We have sent the following medications to your pharmacy for you to pick up at your convenience: Prilosec  We have given you samples of the following medication to take: Prilosec  CC: Dr [name]

## 2012-02-01 NOTE — Progress Notes (Signed)
Jean Mitchell [birth year] MRN [id number]  History of Present Illness:  This is a 78 year old white female ex smoker with presbyesophagus and dysphagia to solids. An upper endoscopy in January 2013 showed a tortuous esophagus without stricture. She was dilated with a 48 [place] Maloney dilator without significant improvement in her dysphagia. She denies pill dysphagia. Prior endoscopies in 2000 and in 2004 showed an esophageal stricture which was dilated with 2 [place] Maloney. She has not taken any acid suppressing agents. She denies nocturnal cough or hoarseness. There is a history of adenomatous colon polyps. Her last colonoscopy was in January 2013. She had a tubular adenoma in 1998 and has a history of breast cancer and COPD.   Past Medical History  Diagnosis Date  . Breast cancer 2007    s/p XRT on Tamoxifen  . Hyperlipidemia   . CHF (congestive heart failure)   . COPD (chronic obstructive pulmonary disease)     o2 at night  . Depression   . AAA (abdominal aortic aneurysm)   . Back pain     lumbar, chronic  . Hypertension   . Osteoporosis     dexa 02-18-08  . Insomnia   . Esophageal stricture   . Adenomatous colon polyp   . Hip fracture 08/2009    R, s/p ORIF  . Arthritis   . Ulcer   . Heart disease   . Diverticulosis   . Anxiety 02/15/2011  . Impaired glucose tolerance 02/18/2011  . Internal hemorrhoids   . 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 Presbyesophagus   . Gastritis   . Tubular adenoma    Past Surgical History  Procedure Date  . Oophorectomy     unilateral  . Knee arthroscopy     right   . Hemorrhoid surgery 1965  . Mastectomy, partial 2007    left  . Abdominal hysterectomy 1968  . Tonsillectomy 1968  . Rotator cuff repair   . Anal carcinoma resection   . Kyphosis surgery     balloon  . Cesarean section     x 2  . Hip fracture surgery     left with plate    reports that she quit smoking about 7 years ago. She has never used smokeless tobacco. She  reports that she does not drink alcohol or use illicit drugs. family history includes Breast cancer in her sister; Cancer in her mother; Colon cancer (age of onset:76) in her father; Colon polyps in her sister; and Heart disease in her mother and sister. Allergies  Allergen Reactions  . Ezetimibe-Simvastatin     REACTION: unspecified  . Gabapentin     REACTION: severe reaction, whelps  . Percocet (Oxycodone-Acetaminophen) Nausea And Vomiting    5/325 mg        Review of Systems: Intermittent dysphagia to solids without evidence of stricture. Denies any lower GI symptoms  The remainder of the 10 point ROS is negative except as outlined in H&P   Physical Exam: General appearance  Well developed, in no distress. Eyes- non icteric. HEENT nontraumatic, normocephalic. Mouth no lesions, tongue papillated, no cheilosis. Neck supple without adenopathy, thyroid not enlarged, no carotid bruits, no JVD. Lungs Clear to auscultation bilaterally. Cor normal S1, normal S2, regular rhythm, no murmur,  quiet precordium. Abdomen: Soft, nontender with normoactive bowel sounds. Rectal: Not done Extremities no pedal edema. Skin no lesions. Neurological alert and oriented x 3. Psychological normal mood and affect.  Assessment and Plan:  Problem #1 This is an 78 year old  white female with presbyesophagus and esophageal dysmotility causing chronic dysphagia. Although the dysphagia is predominantly to solids, there was no stricture noted on a recent endoscopy. We will proceed with a barium esophagram to assess for motility and will start her on Prilosec 20 mg daily. Depending on the findings on the upper GI series, we may ask for a speech pathology consultation to assess her for possible LPR. She ought to be on antireflux measures and cut back on her ibuprofen.  02/01/2012 Jean Mitchell

## 2012-02-02 ENCOUNTER — Encounter: Payer: Self-pay | Admitting: Internal Medicine

## 2012-02-18 ENCOUNTER — Other Ambulatory Visit: Payer: Self-pay

## 2012-02-18 ENCOUNTER — Other Ambulatory Visit (HOSPITAL_COMMUNITY): Payer: Medicare Other

## 2012-02-18 MED ORDER — CLONAZEPAM 0.5 MG PO TABS: 0.5000 mg | ORAL_TABLET | Freq: Three times a day (TID) | ORAL | Status: DC | PRN

## 2012-02-18 NOTE — Telephone Encounter (Signed)
Done hardcopy to robin  

## 2012-02-19 NOTE — Telephone Encounter (Signed)
Faxed hardcopy to pharmacy. 

## 2012-02-25 ENCOUNTER — Other Ambulatory Visit (HOSPITAL_COMMUNITY): Payer: Medicare Other

## 2012-02-25 ENCOUNTER — Ambulatory Visit (INDEPENDENT_AMBULATORY_CARE_PROVIDER_SITE_OTHER): Payer: Medicare Other | Admitting: Internal Medicine

## 2012-02-25 ENCOUNTER — Encounter: Payer: Self-pay | Admitting: Internal Medicine

## 2012-02-25 VITALS — BP 120/60 | HR 86 | Temp 99.0°F | Ht 65.0 in | Wt 110.5 lb

## 2012-02-25 DIAGNOSIS — I10 Essential (primary) hypertension: Secondary | ICD-10-CM

## 2012-02-25 DIAGNOSIS — M5416 Radiculopathy, lumbar region: Secondary | ICD-10-CM

## 2012-02-25 DIAGNOSIS — F411 Generalized anxiety disorder: Secondary | ICD-10-CM

## 2012-02-25 DIAGNOSIS — S41119A Laceration without foreign body of unspecified upper arm, initial encounter: Secondary | ICD-10-CM | POA: Insufficient documentation

## 2012-02-25 DIAGNOSIS — F419 Anxiety disorder, unspecified: Secondary | ICD-10-CM

## 2012-02-25 DIAGNOSIS — IMO0002 Reserved for concepts with insufficient information to code with codable children: Secondary | ICD-10-CM

## 2012-02-25 DIAGNOSIS — S41109A Unspecified open wound of unspecified upper arm, initial encounter: Secondary | ICD-10-CM

## 2012-02-25 MED ORDER — ALPRAZOLAM 0.5 MG PO TABS: ORAL_TABLET | ORAL | Status: DC

## 2012-02-25 NOTE — Progress Notes (Signed)
Subjective:    Patient ID: Jean Mitchell, female    DOB: 07-18-1927, 79 y.o.   MRN: [id number]  HPI here to f/u with son, has walker and cane at home but doesn't use often, Pt continues to have recurring right LBP without change in severity, bowel or bladder change, fever, wt loss,  worsening LE pain/numbness/weakness, and has seen Dr Roosevelt Locks who plans possible ESI per pt, but she is just not sure about it yet.  Became off balanced and tripped, fell at home last Thursday with rather large skin tear to the left upper arm at least 2 cm diameter, bleeding stopped for the most part but still has some minor oozing, has been using neosporin daily and guase.  No fever, red, swelling,red streaks or drainage.  Denies worsening depressive symptoms, suicidal ideation, or panic; has ongoing anxiety, and states klonazepam just not working.  Overall good compliance with treatment, and good medicine tolerability, including the celexa.  Pt denies chest pain, increased sob or doe, wheezing, orthopnea, PND, increased LE swelling, palpitations, dizziness or syncope.  Pt denies new neurological symptoms such as new headache, or facial or extremity weakness or numbness   Pt denies polydipsia, polyuria  Past Medical History  Diagnosis Date  . Breast cancer 2007    s/p XRT on Tamoxifen  . Hyperlipidemia   . CHF (congestive heart failure)   . COPD (chronic obstructive pulmonary disease)     o2 at night  . Depression   . AAA (abdominal aortic aneurysm)   . Back pain     lumbar, chronic  . Hypertension   . Osteoporosis     dexa 02-18-08  . Insomnia   . Esophageal stricture   . Adenomatous colon polyp   . Hip fracture 08/2009    R, s/p ORIF  . Arthritis   . Ulcer   . Heart disease   . Diverticulosis   . Anxiety 02/15/2011  . Impaired glucose tolerance 02/18/2011  . Internal hemorrhoids   . 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 Presbyesophagus   . Gastritis   . Tubular adenoma    Past Surgical History    Procedure Date  . Oophorectomy     unilateral  . Knee arthroscopy     right   . Hemorrhoid surgery 1965  . Mastectomy, partial 2007    left  . Abdominal hysterectomy 1968  . Tonsillectomy 1968  . Rotator cuff repair   . Anal carcinoma resection   . Kyphosis surgery     balloon  . Cesarean section     x 2  . Hip fracture surgery     left with plate    reports that she quit smoking about 8 years ago. She has never used smokeless tobacco. She reports that she does not drink alcohol or use illicit drugs. family history includes Breast cancer in her sister; Cancer in her mother; Colon cancer (age of onset:80) in her father; Colon polyps in her sister; and Heart disease in her mother and sister. Allergies  Allergen Reactions  . Ezetimibe-Simvastatin     REACTION: unspecified  . Gabapentin     REACTION: severe reaction, whelps  . Percocet (Oxycodone-Acetaminophen) Nausea And Vomiting    5/325 mg   Current Outpatient Prescriptions on File Prior to Visit  Medication Sig Dispense Refill  . citalopram (CELEXA) 20 MG tablet TAKE 1 AND 1/2 TABLETS ONCE A DAY  45 tablet  3  . fluticasone (FLONASE) 50 MCG/ACT nasal spray Place 2  sprays into the nose daily.  16 g  6  . Fluticasone-Salmeterol (ADVAIR) 100-50 MCG/DOSE AEPB Inhale 1 puff into the lungs every 12 (twelve) hours.  60 each  6  . furosemide (LASIX) 20 MG tablet Take 20 mg by mouth every Monday, Wednesday, and Friday      . HYDROcodone-acetaminophen (NORCO/VICODIN) 5-325 MG per tablet Take 1 tablet by mouth every 6 (six) hours as needed for pain.  120 tablet  2  . ibuprofen (ADVIL,MOTRIN) 200 MG tablet Take 200 mg by mouth 2 (two) times daily.        Marland Kitchen omeprazole (PRILOSEC) 20 MG capsule Take 1 capsule (20 mg total) by mouth daily.  10 capsule  0  . potassium chloride (K-DUR) 10 MEQ tablet Take 1 tablet (10 mEq total) by mouth daily.  5 tablet  0  . pravastatin (PRAVACHOL) 40 MG tablet Take 1 tablet (40 mg total) by mouth daily.  30  tablet  11  . PROAIR HFA 108 (90 BASE) MCG/ACT inhaler INHALE 2 PUFFS INTO THE LUNGS EVERY 6 (SIX) HOURS AS NEEDED FOR SHORTNESS OF BREATH  8.5 Inhaler  2  . vitamin B-12 (CYANOCOBALAMIN) 100 MCG tablet Take 50 mcg by mouth daily.        Marland Kitchen VITAMIN D, CHOLECALCIFEROL, PO Take 1 tablet by mouth daily.       Marland Kitchen tiotropium (SPIRIVA) 18 MCG inhalation capsule Place 18 mcg into inhaler and inhale daily.       Review of Systems  Constitutional: Negative for unexpected weight change, or unusual diaphoresis  HENT: Negative for tinnitus.   Eyes: Negative for photophobia and visual disturbance.  Respiratory: Negative for choking and stridor.   Gastrointestinal: Negative for vomiting and blood in stool.  Genitourinary: Negative for hematuria and decreased urine volume.  Musculoskeletal: Negative for acute joint swelling Skin: Negative for color change and wound.  Neurological: Negative for tremors and numbness other than noted  Psychiatric/Behavioral: Negative for decreased concentration or  hyperactivity.       Objective:   Physical Exam BP 120/60  Pulse 86  Temp 99 F (37.2 C) (Oral)  Ht 5\' 5"  (1.651 m)  Wt 110 lb 8 oz (50.122 kg)  BMI 18.39 kg/m2  SpO2 90% VS noted,  Constitutional: Pt appears well-developed and well-nourished.  HENT: Head: NCAT.  Right Ear: External ear normal.  Left Ear: External ear normal.  Eyes: Conjunctivae and EOM are normal. Pupils are equal, round, and reactive to light.  Neck: Normal range of motion. Neck supple.  Cardiovascular: Normal rate and regular rhythm.   Pulmonary/Chest: Effort normal and breath sounds normal.  Neurological: Pt is alert. Not confused  Skin: Skin is warm. No erythema. except for left upper lateral arm just above the left elbow with 2 cm epidermal deficit/skin tear with slight oozing stopped with QR powder today, no significant other red, swelling, streaks or drainage, no fluctuacne Psychiatric: Pt behavior is normal. Thought content  normal.     Assessment & Plan:

## 2012-02-25 NOTE — Assessment & Plan Note (Signed)
To f/u Dr Roosevelt Locks who has rec'd ESI per pt, o/w stable overall by history and exam,  and pt to continue medical treatment as before,  to f/u any worsening symptoms or concerns

## 2012-02-25 NOTE — Patient Instructions (Addendum)
Jean Mitchell to add neosporin and gauze to the left upper arm skin tear area Please use Neosporin twice per day and gauze changes until healed Please call if you develop worsening fever, pain, redness or drainage from the area Please take all new medication as prescribed  - the alprazolam for nerves OK to stop the clonazepam Please continue all other medications as before, and refills have been done if requested. Please keep your appointments with your specialists as you have planned - Dr Beane/orthopedic, and the AAA yearly follow up as you do Please remember to sign up for My Chart if you have not done so, as this will be important to you in the future with finding out test results, communicating by private email, and scheduling acute appointments online when needed. Please return in 3 months, or sooner if needed

## 2012-02-25 NOTE — Assessment & Plan Note (Signed)
Klonopin not effective for her, to change to xanax bid prn, cont celexa,  to f/u any worsening symptoms or concerns

## 2012-02-25 NOTE — Assessment & Plan Note (Signed)
stable overall by history and exam, recent data reviewed with pt, and pt to continue medical treatment as before,  to f/u any worsening symptoms or concerns BP Readings from Last 3 Encounters:  02/25/12 120/60  02/01/12 112/72  11/05/11 120/69

## 2012-02-25 NOTE — Assessment & Plan Note (Signed)
Required minor application of QR otc type chemical cautery today with oozing stopped, rather large, to cont neosporin bid and gauze changes

## 2012-02-26 ENCOUNTER — Telehealth: Payer: Self-pay

## 2012-02-26 MED ORDER — CHLORDIAZEPOXIDE HCL 5 MG PO CAPS: ORAL_CAPSULE | ORAL | Status: DC

## 2012-02-26 NOTE — Telephone Encounter (Signed)
Received refill request for Chlordiazepoxide 5 mg cap 1 po in the morning, 1 po in the afternoon and 2 qhs.  Please advise as this medication currently not on medication list.

## 2012-02-26 NOTE — Telephone Encounter (Signed)
Faxed hardcopy to pharmacy. 

## 2012-02-26 NOTE — Telephone Encounter (Signed)
Done hardcopy to robin  

## 2012-02-27 ENCOUNTER — Other Ambulatory Visit (HOSPITAL_COMMUNITY): Payer: Medicare Other

## 2012-03-01 ENCOUNTER — Encounter (HOSPITAL_COMMUNITY): Payer: Self-pay | Admitting: Emergency Medicine

## 2012-03-01 ENCOUNTER — Emergency Department (HOSPITAL_COMMUNITY): Payer: Medicare Other

## 2012-03-01 ENCOUNTER — Emergency Department (HOSPITAL_COMMUNITY)
Admission: EM | Admit: 2012-03-01 | Discharge: 2012-03-01 | Disposition: A | Discharge status: Home / Self Care | Payer: Medicare Other | Attending: Emergency Medicine | Admitting: Emergency Medicine

## 2012-03-01 DIAGNOSIS — Z87891 Personal history of nicotine dependence: Secondary | ICD-10-CM | POA: Insufficient documentation

## 2012-03-01 DIAGNOSIS — Z85828 Personal history of other malignant neoplasm of skin: Secondary | ICD-10-CM | POA: Insufficient documentation

## 2012-03-01 DIAGNOSIS — Z872 Personal history of diseases of the skin and subcutaneous tissue: Secondary | ICD-10-CM | POA: Insufficient documentation

## 2012-03-01 DIAGNOSIS — Z853 Personal history of malignant neoplasm of breast: Secondary | ICD-10-CM | POA: Insufficient documentation

## 2012-03-01 DIAGNOSIS — Z8679 Personal history of other diseases of the circulatory system: Secondary | ICD-10-CM | POA: Insufficient documentation

## 2012-03-01 DIAGNOSIS — M129 Arthropathy, unspecified: Secondary | ICD-10-CM | POA: Insufficient documentation

## 2012-03-01 DIAGNOSIS — K921 Melena: Secondary | ICD-10-CM | POA: Insufficient documentation

## 2012-03-01 DIAGNOSIS — M810 Age-related osteoporosis without current pathological fracture: Secondary | ICD-10-CM | POA: Insufficient documentation

## 2012-03-01 DIAGNOSIS — Z79899 Other long term (current) drug therapy: Secondary | ICD-10-CM | POA: Insufficient documentation

## 2012-03-01 DIAGNOSIS — Z860100 Personal history of colon polyps, unspecified: Secondary | ICD-10-CM | POA: Insufficient documentation

## 2012-03-01 DIAGNOSIS — K5900 Constipation, unspecified: Secondary | ICD-10-CM | POA: Insufficient documentation

## 2012-03-01 DIAGNOSIS — J4489 Other specified chronic obstructive pulmonary disease: Secondary | ICD-10-CM | POA: Insufficient documentation

## 2012-03-01 DIAGNOSIS — G4700 Insomnia, unspecified: Secondary | ICD-10-CM | POA: Insufficient documentation

## 2012-03-01 DIAGNOSIS — Z90710 Acquired absence of both cervix and uterus: Secondary | ICD-10-CM | POA: Insufficient documentation

## 2012-03-01 DIAGNOSIS — K5903 Drug induced constipation: Secondary | ICD-10-CM

## 2012-03-01 DIAGNOSIS — Z8781 Personal history of (healed) traumatic fracture: Secondary | ICD-10-CM | POA: Insufficient documentation

## 2012-03-01 DIAGNOSIS — K5641 Fecal impaction: Secondary | ICD-10-CM

## 2012-03-01 DIAGNOSIS — Z8719 Personal history of other diseases of the digestive system: Secondary | ICD-10-CM | POA: Insufficient documentation

## 2012-03-01 DIAGNOSIS — Z9889 Other specified postprocedural states: Secondary | ICD-10-CM | POA: Insufficient documentation

## 2012-03-01 DIAGNOSIS — E785 Hyperlipidemia, unspecified: Secondary | ICD-10-CM | POA: Insufficient documentation

## 2012-03-01 DIAGNOSIS — J449 Chronic obstructive pulmonary disease, unspecified: Secondary | ICD-10-CM | POA: Insufficient documentation

## 2012-03-01 DIAGNOSIS — IMO0002 Reserved for concepts with insufficient information to code with codable children: Secondary | ICD-10-CM | POA: Insufficient documentation

## 2012-03-01 DIAGNOSIS — K625 Hemorrhage of anus and rectum: Secondary | ICD-10-CM | POA: Insufficient documentation

## 2012-03-01 DIAGNOSIS — F411 Generalized anxiety disorder: Secondary | ICD-10-CM | POA: Insufficient documentation

## 2012-03-01 DIAGNOSIS — I509 Heart failure, unspecified: Secondary | ICD-10-CM | POA: Insufficient documentation

## 2012-03-01 MED ORDER — PEG 3350-KCL-NABCB-NACL-NASULF 236 G PO SOLR: 4.0000 L | Freq: Once | ORAL | Status: DC

## 2012-03-01 NOTE — ED Notes (Signed)
Prior to enema, pt voices need to use the restroom. Bedside commode given to pt. Pt had large amt of stool. Nurse aware and MD notified.

## 2012-03-01 NOTE — ED Notes (Signed)
Enema not done/needed-patient had large BM, noted by tech

## 2012-03-01 NOTE — ED Provider Notes (Signed)
History     CSN: [id number]  Arrival date & time 03/01/12  1478   First MD Initiated Contact with Patient 03/01/12 [phone]      Chief Complaint  Patient presents with  . Constipation  . Rectal Bleeding    (Consider location/radiation/quality/duration/timing/severity/associated sxs/prior treatment) Patient is a 79 y.o. female presenting with constipation and hematochezia. The history is provided by the patient.  Constipation   Rectal Bleeding   She has chronic problems with constipation but felt especially badly constipated last night. She was unable to move her bowels although there is a sense that she needed to. This morning, she had tried using a fleets enema which was unsuccessful and she started having some bleeding following that. She denies any abdominal pain, nausea, vomiting.  Past Medical History  Diagnosis Date  . Breast cancer 2007    s/p XRT on Tamoxifen  . Hyperlipidemia   . CHF (congestive heart failure)   . COPD (chronic obstructive pulmonary disease)     o2 at night  . Depression   . AAA (abdominal aortic aneurysm)   . Back pain     lumbar, chronic  . Hypertension   . Osteoporosis     dexa 02-18-08  . Insomnia   . Esophageal stricture   . Adenomatous colon polyp   . Hip fracture 08/2009    R, s/p ORIF  . Arthritis   . Ulcer   . Heart disease   . Diverticulosis   . Anxiety 02/15/2011  . Impaired glucose tolerance 02/18/2011  . Internal hemorrhoids   . 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 Presbyesophagus   . Gastritis   . Tubular adenoma     Past Surgical History  Procedure Date  . Oophorectomy     unilateral  . Knee arthroscopy     right   . Hemorrhoid surgery 1965  . Mastectomy, partial 2007    left  . Abdominal hysterectomy 1968  . Tonsillectomy 1968  . Rotator cuff repair   . Anal carcinoma resection   . Kyphosis surgery     balloon  . Cesarean section     x 2  . Hip fracture surgery     left with plate    Family History    Problem Relation Age of Onset  . Heart disease Mother   . Cancer Mother     bladder  . Colon cancer Father 8  . Heart disease Sister   . Breast cancer Sister   . Colon polyps Sister     History  Substance Use Topics  . Smoking status: Former Smoker    Quit date: 02/13/2004  . Smokeless tobacco: Never Used  . Alcohol Use: No    OB History    Grav Para Term Preterm Abortions TAB SAB Ect Mult Living                  Review of Systems  Gastrointestinal: Positive for constipation and hematochezia.  All other systems reviewed and are negative.    Allergies  Ezetimibe-simvastatin; Gabapentin; and Percocet  Home Medications   Current Outpatient Rx  Name  Route  Sig  Dispense  Refill  . ALBUTEROL SULFATE HFA 108 (90 BASE) MCG/ACT IN AERS   Inhalation   Inhale 2 puffs into the lungs every 6 (six) hours as needed. For shortness of breath.         . ALPRAZOLAM 0.5 MG PO TABS   Oral   Take 0.25-0.5 mg by mouth  3 (three) times daily as needed. For anxiety.         . CHLORDIAZEPOXIDE HCL 5 MG PO CAPS   Oral   Take 5-10 mg by mouth 3 (three) times daily as needed. 1 cap in am, 1 cap in pm, 2 caps qhs if needed for anxiety.         Marland Kitchen CITALOPRAM HYDROBROMIDE 20 MG PO TABS   Oral   Take 30 mg by mouth daily.         Marland Kitchen FLUTICASONE PROPIONATE 50 MCG/ACT NA SUSP   Nasal   Place 2 sprays into the nose daily.   16 g   6   . FLUTICASONE-SALMETEROL 100-50 MCG/DOSE IN AEPB   Inhalation   Inhale 1 puff into the lungs every 12 (twelve) hours.   60 each   6   . HYDROCODONE-ACETAMINOPHEN 5-325 MG PO TABS   Oral   Take 1 tablet by mouth every 6 (six) hours as needed for pain.   120 tablet   2   . OMEPRAZOLE 20 MG PO CPDR   Oral   Take 1 capsule (20 mg total) by mouth daily.   10 capsule   0     Samples of this drug were given to the patient, qu ...   . PRAVASTATIN SODIUM 40 MG PO TABS   Oral   Take 1 tablet (40 mg total) by mouth daily.   30 tablet   11      BP 111/59  Pulse 82  Temp 98.1 F (36.7 C) (Oral)  Resp 20  SpO2 94%  Physical Exam  Nursing note and vitals reviewed.  79 year old female, resting comfortably and in no acute distress. Vital signs are normal. Oxygen saturation is 94%, which is normal. Head is normocephalic and atraumatic. PERRLA, EOMI. Oropharynx is clear. Neck is nontender and supple without adenopathy or JVD. Back is nontender and there is no CVA tenderness. Lungs are clear without rales, wheezes, or rhonchi. Chest is nontender. Heart has regular rate and rhythm without murmur. Abdomen is soft, flat, nontender without masses or hepatosplenomegaly and peristalsis is normoactive. Rectal: Small external skin tag is present. Soft fecal impaction is present and the patient is partially disimpacted but is unable to tolerate manual disimpaction so that was stopped. There is a small amount of blood mixed with the stool. No masses are felt other than the stool. Extremities have no cyanosis or edema, full range of motion is present. Skin is warm and dry without rash. Neurologic: Mental status is normal, cranial nerves are intact, there are no motor or sensory deficits.. ED Course  Procedures (including critical care time)  Dg Abd 1 View  03/01/2012  *RADIOLOGY REPORT*  Clinical Data: Abdominal pain, constipation, rectal bleeding  ABDOMEN - 1 VIEW  Comparison: None.  Findings:  Large colonic stool burden, with likely fecal impaction within the rectum and distal sigmoid colon.  No definite evidence of obstruction.  No supine evidence of pneumoperitoneum.  No definite pneumatosis or portal venous gas.  Limited visualization of the lower thorax is normal.  Multilevel upper lumbar spine vertebroplasty/kyphoplasty.  Post ORIF of the right femur and right femoral neck.  IMPRESSION: Large colonic stool burden with likely distal fecal impaction.   Original Report Authenticated By: Tacey Ruiz, MD      1. Fecal impaction   2.  Constipation due to pain medication       MDM  Constipation with fecal impaction. KUB will be  obtained to assess stool burden and she will be discharged with a prescription for GoLYTELY.        Dione Booze, MD 03/01/12 1357

## 2012-03-01 NOTE — ED Notes (Signed)
Pt woke this morning at 0400 w/ urge to have BM but was unable. Gave self an enema w/ only bloody return. Last normal BM was on Wednesday, pt does have a tendency to get constipated

## 2012-03-07 ENCOUNTER — Ambulatory Visit (HOSPITAL_COMMUNITY): Payer: Medicare Other

## 2012-03-07 ENCOUNTER — Telehealth: Payer: Self-pay | Admitting: Internal Medicine

## 2012-03-07 NOTE — Telephone Encounter (Signed)
Message copied by Verner Chol on Fri Mar 07, 2012  3:48 PM ------      Message from: Arvilla Meres P      Created: Thu Mar 06, 2012  2:41 PM      Contact: [phone]       Lmovm for pt to call office. Calling to inform patient we will be unable to re-establish her as a patient at this practice.      ----- Message -----         From: Sheliah Hatch, MD         Sent: 03/04/2012   1:02 PM           To: Marshell Garfinkel            No- she's already switched            KT      ----- Message -----         From: Marshell Garfinkel         Sent: 03/04/2012  12:29 PM           To: Lelon Perla, DO, Sheliah Hatch, MD            Patient switched from Dr. Drue Novel to Dr. Jonny Ruiz and is now unhappy with Dr. Jonny Ruiz and would like to see either Dr. Laury Axon or Dr. Beverely Low. She has Medicare and a supplement plan. Would either one of you want to see her?

## 2012-03-07 NOTE — Telephone Encounter (Signed)
ADVISED AS BELOW pt

## 2012-03-10 ENCOUNTER — Other Ambulatory Visit: Payer: Self-pay | Admitting: Internal Medicine

## 2012-03-14 ENCOUNTER — Ambulatory Visit (INDEPENDENT_AMBULATORY_CARE_PROVIDER_SITE_OTHER): Payer: Medicare Other | Admitting: Internal Medicine

## 2012-03-14 ENCOUNTER — Encounter: Payer: Self-pay | Admitting: Internal Medicine

## 2012-03-14 ENCOUNTER — Ambulatory Visit (INDEPENDENT_AMBULATORY_CARE_PROVIDER_SITE_OTHER)
Admission: RE | Admit: 2012-03-14 | Discharge: 2012-03-14 | Disposition: A | Discharge status: Home / Self Care | Payer: Medicare Other | Source: Ambulatory Visit | Attending: Internal Medicine | Admitting: Internal Medicine

## 2012-03-14 ENCOUNTER — Telehealth: Payer: Self-pay | Admitting: Internal Medicine

## 2012-03-14 VITALS — BP 112/64 | HR 86 | Temp 100.3°F | Resp 10 | Wt 113.8 lb

## 2012-03-14 DIAGNOSIS — M810 Age-related osteoporosis without current pathological fracture: Secondary | ICD-10-CM

## 2012-03-14 DIAGNOSIS — R0789 Other chest pain: Secondary | ICD-10-CM

## 2012-03-14 DIAGNOSIS — J449 Chronic obstructive pulmonary disease, unspecified: Secondary | ICD-10-CM

## 2012-03-14 DIAGNOSIS — R071 Chest pain on breathing: Secondary | ICD-10-CM

## 2012-03-14 MED ORDER — HYDROCODONE-ACETAMINOPHEN 10-325 MG PO TABS: 1.0000 | ORAL_TABLET | Freq: Three times a day (TID) | ORAL | Status: DC | PRN

## 2012-03-14 NOTE — Telephone Encounter (Signed)
Patient Information:  Caller Name: Sharayah  Phone: [phone]  Patient: Jean Mitchell, Jean Mitchell  Gender: Female  DOB: [birth year]  Age: 79 Years  PCP: [name] (Adults only)  Office Follow Up:  Does the office need to follow up with this patient?: Yes  Instructions For The Office: Patient with possible rib fracture. fell 1 1/2 weeks ago now with onset pain with inspiration  RN Note:  Called office and spoke with Zella Ball- They state patient can be seen in the office for evaluation.   Patient will be seen at 1:45 with Dr.Lesbeur MD. Tyson Dense expressed.  Symptoms  Reason For Call & Symptoms: Patient fell 1 1/2 weeks ago. She tripped and landed in dining room and struck her right side (waist up) on the wall and a coffee pot. She did not see any bruising but was sore after the incident.   She states onset today of difficulty breathing while getting her hair done today.  She is having pain with inspiration and  Movement.  Patient states she is able to breath but it is painful with deep inspiration.   Reviewed Health History In EMR: Yes  Reviewed Medications In EMR: Yes  Reviewed Allergies In EMR: Yes  Reviewed Surgeries / Procedures: No  Date of Onset of Symptoms: 03/05/2012  Guideline(s) Used:  Chest Injury  Disposition Per Guideline:   Go to ED Now (or to Office with PCP Approval)  Reason For Disposition Reached:   Can't take a deep breath but no respiratory distress (e.g., hurts to take a deep breath)  Advice Given:  N/A  Appointment Scheduled:  03/14/2012 13:45:00 Appointment Scheduled Provider:  Rene Paci (Adults only)

## 2012-03-14 NOTE — Patient Instructions (Addendum)
It was good to see you today. Test(s) ordered today. Your results will be released to MyChart (or called to you) after review, usually within 72hours after test completion. If any changes need to be made, you will be notified at that same time. Increase Norco dose for control of pain - Your prescription(s) have been given to you to submit to your pharmacy. Please take as directed and contact our office if you believe you are having problem(s) with the medication(s). Rib Fracture The ribs are like a cage that goes around your upper chest. The ribs protect your lungs. Your ribs move when you breathe, so it hurts if a rib is broken. HOME CARE  Avoid activities that cause pain to the injured area. Protect your injured area.   Eat a normal, healthy diet.   Drink enough fluids to keep your pee (urine) clear or pale yellow.   Take deep breaths many times a day. Cough many times a day while hugging a pillow.   Do not wear a rib belt or binder on the chest unless told by your doctor. Rib belts or binders do not allow you to breathe deeply.   Only take medicine as told by your doctor.  GET HELP RIGHT AWAY IF:    You have a fever.   You cannot stop coughing or cough up thick or bloody spit (mucus).   You have trouble breathing.   You feel sick to your stomach (nauseous), throw up (vomit), or have belly (abdominal) pain.   Your pain gets worse and medicine does not help.  MAKE SURE YOU:    Understand these instructions.   Will watch this condition.   Will get help right away if you are not doing well or get worse.  Document Released: 11/08/2007 Document Revised: 04/23/2011 Document Reviewed: 11/08/2007 [facility] Patient Information 2013 [place], [place].

## 2012-03-14 NOTE — Progress Notes (Signed)
Subjective:    Patient ID: Jean Mitchell, female    DOB: Apr 04, 1927, 79 y.o.   MRN: [id number]  HPI Complains of pain right anterior chest wall Onset 5 days ago Precipitated by direct trauma when she accidentally fell against right chest, landed on object Pain associated with mild dyspnea (more than baseline) Denies cough, pleurisy but exacerbation of pain with deep inspiratory effort or movement Pain somewhat improved with use of Norco as prescribed for arthritis pain symptoms  Past Medical History  Diagnosis Date  . Breast cancer 2007    s/p XRT on Tamoxifen  . Hyperlipidemia   . CHF (congestive heart failure)   . COPD (chronic obstructive pulmonary disease)     o2 at night  . Depression   . AAA (abdominal aortic aneurysm)   . Back pain     lumbar, chronic  . Hypertension   . Osteoporosis     dexa 02-18-08  . Insomnia   . Esophageal stricture   . Adenomatous colon polyp   . Hip fracture 08/2009    R, s/p ORIF  . Arthritis   . Ulcer   . Heart disease   . Diverticulosis   . Anxiety 02/15/2011  . Impaired glucose tolerance 02/18/2011  . Internal hemorrhoids   . 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 Presbyesophagus   . Gastritis   . Tubular adenoma     Review of Systems  Constitutional: Negative for fever and fatigue.  Respiratory: Positive for shortness of breath. Negative for choking and wheezing.   Cardiovascular: Positive for chest pain. Negative for palpitations and leg swelling.  Neurological: Negative for tremors, seizures, syncope and weakness.       Objective:   Physical Exam BP 112/64  Pulse 86  Temp 100.3 F (37.9 C) (Oral)  Resp 10  Wt 113 lb 12.8 oz (51.619 kg)  SpO2 93% Wt Readings from Last 3 Encounters:  03/14/12 113 lb 12.8 oz (51.619 kg)  02/25/12 110 lb 8 oz (50.122 kg)  02/01/12 111 lb 4 oz (50.463 kg)   Constitutional: She appears well-developed and well-nourished. Uncomfortable, but no distress.  Neck: Normal range of motion. Neck  supple. No JVD present. No thyromegaly present.  Cardiovascular: Normal rate, regular rhythm and normal heart sounds.  No murmur heard. No BLE edema. Pulmonary/Chest: Tender to palpation along midaxillary line at and above right breast - resp effort normal and breath sounds normal. No respiratory distress. She has no wheezes.  Neurological: She is alert and oriented to person, place, and time. No cranial nerve deficit. Coordination normal.  Skin: No bruising or laceration over right anterior chest. Skin is warm and dry. No rash noted. No erythema.  Psychiatric: She has a normal mood and affect. Her behavior is normal. Judgment and thought content normal.   Lab Results  Component Value Date   WBC 6.2 10/22/2011   HGB 11.6* 10/22/2011   HCT 36.0 10/22/2011   PLT 256.0 10/22/2011   GLUCOSE 95 10/22/2011   CHOL 179 06/29/2010   TRIG 52.0 06/29/2010   HDL 95.30 06/29/2010   LDLDIRECT 88.2 11/21/2009   LDLCALC 73 06/29/2010   ALT 18 10/22/2011   AST 21 10/22/2011   NA 140 10/22/2011   K 3.9 10/22/2011   CL 99 10/22/2011   CREATININE 0.5 10/22/2011   BUN 10 10/22/2011   CO2 33* 10/22/2011   TSH 1.61 02/27/2011   INR 1.22 08/24/2009   HGBA1C 5.5 10/22/2011      Assessment & Plan:  R chest wall pain - fall s/p 5d ago - direct trauma -rule out fracture  osteoporosis -increased risk fracture COPD, increase dyspnea - rule out pneumothorax   Check DG rule out fracture Increase dose hydrocodone to use prn

## 2012-03-17 ENCOUNTER — Inpatient Hospital Stay (HOSPITAL_COMMUNITY): Payer: Medicare Other

## 2012-03-17 ENCOUNTER — Inpatient Hospital Stay (HOSPITAL_COMMUNITY)
Admission: EM | Admit: 2012-03-17 | Discharge: 2012-03-20 | DRG: 481 | Disposition: A | Discharge status: Skilled Nursing Facility | Payer: Medicare Other | Attending: Internal Medicine | Admitting: Internal Medicine

## 2012-03-17 ENCOUNTER — Encounter (HOSPITAL_COMMUNITY): Payer: Self-pay | Admitting: Emergency Medicine

## 2012-03-17 ENCOUNTER — Emergency Department (HOSPITAL_COMMUNITY): Payer: Medicare Other

## 2012-03-17 ENCOUNTER — Encounter (HOSPITAL_COMMUNITY): Payer: Self-pay | Admitting: Anesthesiology

## 2012-03-17 ENCOUNTER — Encounter (HOSPITAL_COMMUNITY)
Admission: EM | Disposition: A | Discharge status: Skilled Nursing Facility | Payer: Self-pay | Source: Home / Self Care | Attending: Internal Medicine

## 2012-03-17 ENCOUNTER — Inpatient Hospital Stay (HOSPITAL_COMMUNITY): Payer: Medicare Other | Admitting: Anesthesiology

## 2012-03-17 DIAGNOSIS — R7302 Impaired glucose tolerance (oral): Secondary | ICD-10-CM

## 2012-03-17 DIAGNOSIS — F419 Anxiety disorder, unspecified: Secondary | ICD-10-CM | POA: Diagnosis present

## 2012-03-17 DIAGNOSIS — F329 Major depressive disorder, single episode, unspecified: Secondary | ICD-10-CM

## 2012-03-17 DIAGNOSIS — Z0000 Encounter for general adult medical examination without abnormal findings: Secondary | ICD-10-CM

## 2012-03-17 DIAGNOSIS — S72002A Fracture of unspecified part of neck of left femur, initial encounter for closed fracture: Secondary | ICD-10-CM

## 2012-03-17 DIAGNOSIS — M545 Low back pain: Secondary | ICD-10-CM

## 2012-03-17 DIAGNOSIS — I10 Essential (primary) hypertension: Secondary | ICD-10-CM | POA: Diagnosis present

## 2012-03-17 DIAGNOSIS — R109 Unspecified abdominal pain: Secondary | ICD-10-CM

## 2012-03-17 DIAGNOSIS — R634 Abnormal weight loss: Secondary | ICD-10-CM | POA: Diagnosis present

## 2012-03-17 DIAGNOSIS — I872 Venous insufficiency (chronic) (peripheral): Secondary | ICD-10-CM

## 2012-03-17 DIAGNOSIS — R5383 Other fatigue: Secondary | ICD-10-CM

## 2012-03-17 DIAGNOSIS — S72009A Fracture of unspecified part of neck of unspecified femur, initial encounter for closed fracture: Secondary | ICD-10-CM

## 2012-03-17 DIAGNOSIS — K222 Esophageal obstruction: Secondary | ICD-10-CM

## 2012-03-17 DIAGNOSIS — F17200 Nicotine dependence, unspecified, uncomplicated: Secondary | ICD-10-CM | POA: Diagnosis present

## 2012-03-17 DIAGNOSIS — I350 Nonrheumatic aortic (valve) stenosis: Secondary | ICD-10-CM

## 2012-03-17 DIAGNOSIS — I359 Nonrheumatic aortic valve disorder, unspecified: Secondary | ICD-10-CM

## 2012-03-17 DIAGNOSIS — Z9181 History of falling: Secondary | ICD-10-CM

## 2012-03-17 DIAGNOSIS — Z79899 Other long term (current) drug therapy: Secondary | ICD-10-CM

## 2012-03-17 DIAGNOSIS — R1310 Dysphagia, unspecified: Secondary | ICD-10-CM

## 2012-03-17 DIAGNOSIS — W010XXA Fall on same level from slipping, tripping and stumbling without subsequent striking against object, initial encounter: Secondary | ICD-10-CM | POA: Diagnosis present

## 2012-03-17 DIAGNOSIS — D62 Acute posthemorrhagic anemia: Secondary | ICD-10-CM | POA: Diagnosis not present

## 2012-03-17 DIAGNOSIS — S72143A Displaced intertrochanteric fracture of unspecified femur, initial encounter for closed fracture: Principal | ICD-10-CM | POA: Diagnosis present

## 2012-03-17 DIAGNOSIS — G4700 Insomnia, unspecified: Secondary | ICD-10-CM

## 2012-03-17 DIAGNOSIS — R296 Repeated falls: Secondary | ICD-10-CM

## 2012-03-17 DIAGNOSIS — Z9221 Personal history of antineoplastic chemotherapy: Secondary | ICD-10-CM

## 2012-03-17 DIAGNOSIS — F3289 Other specified depressive episodes: Secondary | ICD-10-CM | POA: Diagnosis present

## 2012-03-17 DIAGNOSIS — J4489 Other specified chronic obstructive pulmonary disease: Secondary | ICD-10-CM | POA: Diagnosis present

## 2012-03-17 DIAGNOSIS — R55 Syncope and collapse: Secondary | ICD-10-CM

## 2012-03-17 DIAGNOSIS — C50919 Malignant neoplasm of unspecified site of unspecified female breast: Secondary | ICD-10-CM | POA: Diagnosis present

## 2012-03-17 DIAGNOSIS — Z8719 Personal history of other diseases of the digestive system: Secondary | ICD-10-CM

## 2012-03-17 DIAGNOSIS — J449 Chronic obstructive pulmonary disease, unspecified: Secondary | ICD-10-CM

## 2012-03-17 DIAGNOSIS — F411 Generalized anxiety disorder: Secondary | ICD-10-CM

## 2012-03-17 DIAGNOSIS — I509 Heart failure, unspecified: Secondary | ICD-10-CM | POA: Diagnosis present

## 2012-03-17 DIAGNOSIS — I2510 Atherosclerotic heart disease of native coronary artery without angina pectoris: Secondary | ICD-10-CM | POA: Diagnosis present

## 2012-03-17 DIAGNOSIS — G8929 Other chronic pain: Secondary | ICD-10-CM

## 2012-03-17 DIAGNOSIS — Y92009 Unspecified place in unspecified non-institutional (private) residence as the place of occurrence of the external cause: Secondary | ICD-10-CM

## 2012-03-17 DIAGNOSIS — M810 Age-related osteoporosis without current pathological fracture: Secondary | ICD-10-CM | POA: Diagnosis present

## 2012-03-17 DIAGNOSIS — R4020 Unspecified coma: Secondary | ICD-10-CM

## 2012-03-17 DIAGNOSIS — E785 Hyperlipidemia, unspecified: Secondary | ICD-10-CM | POA: Diagnosis present

## 2012-03-17 DIAGNOSIS — M5416 Radiculopathy, lumbar region: Secondary | ICD-10-CM

## 2012-03-17 DIAGNOSIS — S41119A Laceration without foreign body of unspecified upper arm, initial encounter: Secondary | ICD-10-CM

## 2012-03-17 DIAGNOSIS — I5032 Chronic diastolic (congestive) heart failure: Secondary | ICD-10-CM | POA: Diagnosis present

## 2012-03-17 DIAGNOSIS — I714 Abdominal aortic aneurysm, without rupture: Secondary | ICD-10-CM

## 2012-03-17 DIAGNOSIS — D509 Iron deficiency anemia, unspecified: Secondary | ICD-10-CM | POA: Diagnosis present

## 2012-03-17 DIAGNOSIS — Z9889 Other specified postprocedural states: Secondary | ICD-10-CM

## 2012-03-17 DIAGNOSIS — C210 Malignant neoplasm of anus, unspecified: Secondary | ICD-10-CM

## 2012-03-17 DIAGNOSIS — S72142A Displaced intertrochanteric fracture of left femur, initial encounter for closed fracture: Secondary | ICD-10-CM

## 2012-03-17 HISTORY — PX: FEMUR IM NAIL: SHX1597

## 2012-03-17 LAB — URINALYSIS, ROUTINE W REFLEX MICROSCOPIC
Bilirubin Urine: NEGATIVE
Nitrite: NEGATIVE
Specific Gravity, Urine: 1.015 (ref 1.005–1.030)
pH: 5.5 (ref 5.0–8.0)

## 2012-03-17 LAB — POCT I-STAT, CHEM 8
Chloride: 103 mEq/L (ref 96–112)
Creatinine, Ser: 0.9 mg/dL (ref 0.50–1.10)
Glucose, Bld: 97 mg/dL (ref 70–99)
HCT: 31 % — ABNORMAL LOW (ref 36.0–46.0)
Hemoglobin: 10.5 g/dL — ABNORMAL LOW (ref 12.0–15.0)
Potassium: 4.3 mEq/L (ref 3.5–5.1)
Sodium: 140 mEq/L (ref 135–145)

## 2012-03-17 LAB — CBC
HCT: 31.7 % — ABNORMAL LOW (ref 36.0–46.0)
Hemoglobin: 10.3 g/dL — ABNORMAL LOW (ref 12.0–15.0)
MCHC: 32.5 g/dL (ref 30.0–36.0)
RBC: 3.27 MIL/uL — ABNORMAL LOW (ref 3.87–5.11)

## 2012-03-17 LAB — URINE MICROSCOPIC-ADD ON

## 2012-03-17 LAB — POCT I-STAT TROPONIN I: Troponin i, poc: 0 ng/mL (ref 0.00–0.08)

## 2012-03-17 SURGERY — INSERTION, INTRAMEDULLARY ROD, FEMUR
Anesthesia: Spinal | Site: Hip | Laterality: Left | Wound class: Clean

## 2012-03-17 MED ORDER — ACETAMINOPHEN 10 MG/ML IV SOLN
INTRAVENOUS | Status: AC
  Filled 2012-03-17: qty 100

## 2012-03-17 MED ORDER — MEPERIDINE HCL 50 MG/ML IJ SOLN: 6.2500 mg | INTRAMUSCULAR | Status: DC | PRN

## 2012-03-17 MED ORDER — DEXAMETHASONE SODIUM PHOSPHATE 10 MG/ML IJ SOLN
INTRAMUSCULAR | Status: DC | PRN
  Administered 2012-03-17: 10 mg via INTRAVENOUS

## 2012-03-17 MED ORDER — BUDESONIDE 0.5 MG/2ML IN SUSP
0.5000 mg | Freq: Two times a day (BID) | RESPIRATORY_TRACT | Status: DC
  Administered 2012-03-17 – 2012-03-19 (×5): 0.5 mg via RESPIRATORY_TRACT
  Filled 2012-03-17 (×9): qty 2

## 2012-03-17 MED ORDER — FENTANYL CITRATE 0.05 MG/ML IJ SOLN
INTRAMUSCULAR | Status: DC | PRN
  Administered 2012-03-17 (×3): 25 ug via INTRAVENOUS
  Administered 2012-03-17: 50 ug via INTRAVENOUS
  Administered 2012-03-17: 25 ug via INTRAVENOUS

## 2012-03-17 MED ORDER — PROPOFOL 10 MG/ML IV BOLUS
INTRAVENOUS | Status: DC | PRN
  Administered 2012-03-17: 20 mg via INTRAVENOUS

## 2012-03-17 MED ORDER — METOCLOPRAMIDE HCL 5 MG/ML IJ SOLN: 5.0000 mg | Freq: Three times a day (TID) | INTRAMUSCULAR | Status: DC | PRN

## 2012-03-17 MED ORDER — ONDANSETRON HCL 4 MG/2ML IJ SOLN: 4.0000 mg | Freq: Four times a day (QID) | INTRAMUSCULAR | Status: DC | PRN

## 2012-03-17 MED ORDER — ONDANSETRON HCL 4 MG/2ML IJ SOLN
4.0000 mg | Freq: Once | INTRAMUSCULAR | Status: AC
  Administered 2012-03-17: 4 mg via INTRAVENOUS
  Filled 2012-03-17: qty 2

## 2012-03-17 MED ORDER — MENTHOL 3 MG MT LOZG
1.0000 | LOZENGE | OROMUCOSAL | Status: DC | PRN
  Filled 2012-03-17: qty 9

## 2012-03-17 MED ORDER — KETAMINE HCL 10 MG/ML IJ SOLN
INTRAMUSCULAR | Status: DC | PRN
  Administered 2012-03-17 (×12): 1 mg via INTRAVENOUS
  Administered 2012-03-17: 11 mg via INTRAVENOUS
  Administered 2012-03-17 (×9): 1 mg via INTRAVENOUS
  Administered 2012-03-17: 6 mg via INTRAVENOUS
  Administered 2012-03-17 (×6): 1 mg via INTRAVENOUS
  Administered 2012-03-17: 11 mg via INTRAVENOUS
  Administered 2012-03-17 (×22): 1 mg via INTRAVENOUS

## 2012-03-17 MED ORDER — CEFAZOLIN SODIUM-DEXTROSE 2-3 GM-% IV SOLR
INTRAVENOUS | Status: AC
  Filled 2012-03-17: qty 50

## 2012-03-17 MED ORDER — PROPOFOL 10 MG/ML IV EMUL
INTRAVENOUS | Status: DC | PRN
  Administered 2012-03-17: 75 ug/kg/min via INTRAVENOUS

## 2012-03-17 MED ORDER — SENNA 8.6 MG PO TABS
1.0000 | ORAL_TABLET | Freq: Two times a day (BID) | ORAL | Status: DC
Start: 2012-03-17 — End: 2012-03-20
  Administered 2012-03-18 – 2012-03-20 (×5): 8.6 mg via ORAL
  Filled 2012-03-17 (×5): qty 1

## 2012-03-17 MED ORDER — ACETAMINOPHEN 10 MG/ML IV SOLN: 1000.0000 mg | Freq: Once | INTRAVENOUS | Status: DC | PRN

## 2012-03-17 MED ORDER — HYDROMORPHONE HCL PF 1 MG/ML IJ SOLN: 1.0000 mg | Freq: Once | INTRAMUSCULAR | Status: AC

## 2012-03-17 MED ORDER — 0.9 % SODIUM CHLORIDE (POUR BTL) OPTIME
TOPICAL | Status: DC | PRN
  Administered 2012-03-17: 1000 mL

## 2012-03-17 MED ORDER — METOCLOPRAMIDE HCL 5 MG/ML IJ SOLN
INTRAMUSCULAR | Status: DC | PRN
  Administered 2012-03-17: 10 mg via INTRAVENOUS

## 2012-03-17 MED ORDER — ACETAMINOPHEN 10 MG/ML IV SOLN
INTRAVENOUS | Status: DC | PRN
  Administered 2012-03-17: 1000 mg via INTRAVENOUS

## 2012-03-17 MED ORDER — ALBUTEROL SULFATE HFA 108 (90 BASE) MCG/ACT IN AERS
INHALATION_SPRAY | RESPIRATORY_TRACT | Status: AC
  Filled 2012-03-17: qty 6.7

## 2012-03-17 MED ORDER — ENOXAPARIN SODIUM 40 MG/0.4ML ~~LOC~~ SOLN
40.0000 mg | SUBCUTANEOUS | Status: DC
  Administered 2012-03-18 – 2012-03-20 (×3): 40 mg via SUBCUTANEOUS
  Filled 2012-03-17 (×4): qty 0.4

## 2012-03-17 MED ORDER — FLUTICASONE PROPIONATE 50 MCG/ACT NA SUSP
2.0000 | Freq: Every day | NASAL | Status: DC
  Administered 2012-03-18 – 2012-03-20 (×3): 2 via NASAL
  Filled 2012-03-17: qty 16

## 2012-03-17 MED ORDER — SODIUM CHLORIDE 0.9 % IV SOLN
INTRAVENOUS | Status: DC
  Administered 2012-03-17: 09:00:00 via INTRAVENOUS
  Administered 2012-03-17: 125 mL/h via INTRAVENOUS
  Administered 2012-03-18: 04:00:00 via INTRAVENOUS

## 2012-03-17 MED ORDER — PROMETHAZINE HCL 25 MG/ML IJ SOLN: 6.2500 mg | INTRAMUSCULAR | Status: DC | PRN

## 2012-03-17 MED ORDER — METOCLOPRAMIDE HCL 5 MG PO TABS
5.0000 mg | ORAL_TABLET | Freq: Three times a day (TID) | ORAL | Status: DC | PRN
  Filled 2012-03-17: qty 2

## 2012-03-17 MED ORDER — HYDROMORPHONE HCL PF 1 MG/ML IJ SOLN
INTRAMUSCULAR | Status: AC
  Administered 2012-03-17: 1 mg via INTRAVENOUS
  Filled 2012-03-17: qty 1

## 2012-03-17 MED ORDER — MORPHINE SULFATE 2 MG/ML IJ SOLN
1.0000 mg | INTRAMUSCULAR | Status: DC | PRN
  Administered 2012-03-17: 2 mg via INTRAVENOUS
  Administered 2012-03-18: 1 mg via INTRAVENOUS
  Filled 2012-03-17 (×2): qty 1

## 2012-03-17 MED ORDER — LORAZEPAM 2 MG/ML IJ SOLN: 0.5000 mg | INTRAMUSCULAR | Status: DC | PRN

## 2012-03-17 MED ORDER — PHENYLEPHRINE HCL 10 MG/ML IJ SOLN
20.0000 mg | INTRAVENOUS | Status: DC | PRN
  Administered 2012-03-17: 20 ug/min via INTRAVENOUS

## 2012-03-17 MED ORDER — FENTANYL CITRATE 0.05 MG/ML IJ SOLN: 25.0000 ug | INTRAMUSCULAR | Status: DC | PRN

## 2012-03-17 MED ORDER — HYDROMORPHONE HCL PF 1 MG/ML IJ SOLN
1.0000 mg | Freq: Once | INTRAMUSCULAR | Status: AC
  Administered 2012-03-17 (×2): 1 mg via INTRAVENOUS
  Filled 2012-03-17: qty 1

## 2012-03-17 MED ORDER — LACTATED RINGERS IV SOLN
INTRAVENOUS | Status: DC | PRN
  Administered 2012-03-17: 16:00:00 via INTRAVENOUS

## 2012-03-17 MED ORDER — PHENOL 1.4 % MT LIQD
1.0000 | OROMUCOSAL | Status: DC | PRN
  Filled 2012-03-17: qty 177

## 2012-03-17 MED ORDER — MOMETASONE FURO-FORMOTEROL FUM 100-5 MCG/ACT IN AERO
2.0000 | INHALATION_SPRAY | Freq: Two times a day (BID) | RESPIRATORY_TRACT | Status: DC
  Filled 2012-03-17: qty 8.8

## 2012-03-17 MED ORDER — HYDROCODONE-ACETAMINOPHEN 5-325 MG PO TABS: 1.0000 | ORAL_TABLET | Freq: Four times a day (QID) | ORAL | Status: DC | PRN

## 2012-03-17 MED ORDER — VITAMINS A & D EX OINT
TOPICAL_OINTMENT | CUTANEOUS | Status: AC
  Administered 2012-03-17: 15:00:00
  Filled 2012-03-17: qty 5

## 2012-03-17 MED ORDER — IPRATROPIUM BROMIDE 0.02 % IN SOLN
0.5000 mg | Freq: Three times a day (TID) | RESPIRATORY_TRACT | Status: DC
  Administered 2012-03-17 – 2012-03-20 (×8): 0.5 mg via RESPIRATORY_TRACT
  Filled 2012-03-17 (×9): qty 2.5

## 2012-03-17 MED ORDER — ONDANSETRON HCL 4 MG/2ML IJ SOLN
INTRAMUSCULAR | Status: DC | PRN
  Administered 2012-03-17: 4 mg via INTRAVENOUS

## 2012-03-17 MED ORDER — HYDROCODONE-ACETAMINOPHEN 5-325 MG PO TABS
1.0000 | ORAL_TABLET | ORAL | Status: DC | PRN
  Administered 2012-03-18: 1 via ORAL
  Filled 2012-03-17: qty 1

## 2012-03-17 MED ORDER — FERROUS SULFATE 325 (65 FE) MG PO TABS
325.0000 mg | ORAL_TABLET | Freq: Three times a day (TID) | ORAL | Status: DC
  Administered 2012-03-18 – 2012-03-20 (×7): 325 mg via ORAL
  Filled 2012-03-17 (×11): qty 1

## 2012-03-17 MED ORDER — ONDANSETRON HCL 4 MG PO TABS: 4.0000 mg | ORAL_TABLET | Freq: Four times a day (QID) | ORAL | Status: DC | PRN

## 2012-03-17 MED ORDER — BUPIVACAINE IN DEXTROSE 0.75-8.25 % IT SOLN
INTRATHECAL | Status: DC | PRN
  Administered 2012-03-17: 2 mL via INTRATHECAL

## 2012-03-17 MED ORDER — CEFAZOLIN SODIUM-DEXTROSE 2-3 GM-% IV SOLR
INTRAVENOUS | Status: DC | PRN
  Administered 2012-03-17: 2 g via INTRAVENOUS

## 2012-03-17 MED ORDER — CEFAZOLIN SODIUM-DEXTROSE 2-3 GM-% IV SOLR
2.0000 g | Freq: Four times a day (QID) | INTRAVENOUS | Status: AC
  Administered 2012-03-17 – 2012-03-18 (×2): 2 g via INTRAVENOUS
  Filled 2012-03-17 (×2): qty 50

## 2012-03-17 MED ORDER — ALBUTEROL SULFATE (5 MG/ML) 0.5% IN NEBU
2.5000 mg | INHALATION_SOLUTION | Freq: Three times a day (TID) | RESPIRATORY_TRACT | Status: DC
  Administered 2012-03-17 – 2012-03-20 (×8): 2.5 mg via RESPIRATORY_TRACT
  Filled 2012-03-17 (×7): qty 0.5

## 2012-03-17 MED ORDER — ALBUTEROL SULFATE HFA 108 (90 BASE) MCG/ACT IN AERS
INHALATION_SPRAY | RESPIRATORY_TRACT | Status: DC | PRN
  Administered 2012-03-17: 4 via RESPIRATORY_TRACT

## 2012-03-17 MED ORDER — DOCUSATE SODIUM 100 MG PO CAPS
100.0000 mg | ORAL_CAPSULE | Freq: Two times a day (BID) | ORAL | Status: DC
  Administered 2012-03-18 – 2012-03-20 (×5): 100 mg via ORAL
  Filled 2012-03-17 (×7): qty 1

## 2012-03-17 SURGICAL SUPPLY — 39 items
BAG SPEC THK2 15X12 ZIP CLS (MISCELLANEOUS) ×1
BAG ZIPLOCK 12X15 (MISCELLANEOUS) ×2 IMPLANT
BANDAGE GAUZE ELAST BULKY 4 IN (GAUZE/BANDAGES/DRESSINGS) ×2 IMPLANT
BIT DRILL CANN LG 4.3MM (BIT) IMPLANT
BNDG COHESIVE 6X5 TAN STRL LF (GAUZE/BANDAGES/DRESSINGS) ×1 IMPLANT
CLOTH BEACON ORANGE TIMEOUT ST (SAFETY) ×2 IMPLANT
DRAPE INCISE IOBAN 66X45 STRL (DRAPES) ×2 IMPLANT
DRAPE STERI IOBAN 125X83 (DRAPES) ×2 IMPLANT
DRILL BIT CANN LG 4.3MM (BIT) ×2
DRSG MEPILEX BORDER 4X4 (GAUZE/BANDAGES/DRESSINGS) ×1 IMPLANT
DRSG MEPILEX BORDER 4X8 (GAUZE/BANDAGES/DRESSINGS) ×1 IMPLANT
DURAPREP 26ML APPLICATOR (WOUND CARE) ×2 IMPLANT
ELECT REM PT RETURN 9FT ADLT (ELECTROSURGICAL) ×2
ELECTRODE REM PT RTRN 9FT ADLT (ELECTROSURGICAL) ×1 IMPLANT
FACESHIELD LNG OPTICON STERILE (SAFETY) ×1 IMPLANT
GLOVE BIOGEL PI IND STRL 7.5 (GLOVE) ×1 IMPLANT
GLOVE BIOGEL PI IND STRL 8 (GLOVE) ×1 IMPLANT
GLOVE BIOGEL PI INDICATOR 7.5 (GLOVE) ×1
GLOVE BIOGEL PI INDICATOR 8 (GLOVE) ×1
GLOVE ECLIPSE 8.0 STRL XLNG CF (GLOVE) ×1 IMPLANT
GLOVE ORTHO TXT STRL SZ7.5 (GLOVE) ×4 IMPLANT
GOWN BRE IMP PREV XXLGXLNG (GOWN DISPOSABLE) ×3 IMPLANT
GOWN STRL NON-REIN LRG LVL3 (GOWN DISPOSABLE) ×2 IMPLANT
GUIDEPIN 3.2X17.5 THRD DISP (PIN) ×2 IMPLANT
HIP FRAC NAIL LAG SCR 10.5X100 (Orthopedic Implant) ×1 IMPLANT
KIT BASIN OR (CUSTOM PROCEDURE TRAY) ×2 IMPLANT
MANIFOLD NEPTUNE II (INSTRUMENTS) ×2 IMPLANT
NAIL HIP FRACT 130D 11X180 (Screw) ×1 IMPLANT
PACK GENERAL/GYN (CUSTOM PROCEDURE TRAY) ×2 IMPLANT
PADDING CAST COTTON 6X4 STRL (CAST SUPPLIES) ×1 IMPLANT
POSITIONER SURGICAL ARM (MISCELLANEOUS) ×3 IMPLANT
SCREW BONE CORTICAL 5.0X36 (Screw) ×1 IMPLANT
SCREW CANN THRD AFF 10.5X100 (Orthopedic Implant) IMPLANT
STAPLER VISISTAT 35W (STAPLE) ×2 IMPLANT
SUT MNCRL AB 4-0 PS2 18 (SUTURE) ×1 IMPLANT
SUT VIC AB 1 CT1 36 (SUTURE) ×2 IMPLANT
SUT VIC AB 2-0 CT1 27 (SUTURE) ×4
SUT VIC AB 2-0 CT1 TAPERPNT 27 (SUTURE) IMPLANT
TOWEL OR 17X26 10 PK STRL BLUE (TOWEL DISPOSABLE) ×4 IMPLANT

## 2012-03-17 NOTE — Transfer of Care (Signed)
Immediate Anesthesia Transfer of Care Note  Patient: Jean Mitchell  Procedure(s) Performed: Procedure(s) (LRB) with comments: INTRAMEDULLARY (IM) NAIL FEMORAL (Left)  Patient Location: PACU  Anesthesia Type:MAC and Spinal  Level of Consciousness: awake, sedated and responds to stimulation  Airway & Oxygen Therapy: Patient Spontanous Breathing and Patient connected to face mask oxygen  Post-op Assessment: Report given to PACU RN and Post -op Vital signs reviewed and stable  Post vital signs: Reviewed and stable  Complications: No apparent anesthesia complications

## 2012-03-17 NOTE — Brief Op Note (Signed)
03/17/2012  5:10 PM  PATIENT:  Jean Mitchell  79 y.o. female  PRE-OPERATIVE DIAGNOSIS:  left intertrochanteric hip fracture  POST-OPERATIVE DIAGNOSIS:  left intertrochanteric hip fracture  PROCEDURE:  Procedure(s) (LRB) with comments: INTRAMEDULLARY (IM) NAIL FEMORAL (Left)  SURGEON:  Surgeon(s) and Role:    * Shelda Pal, MD - Primary  PHYSICIAN ASSISTANT: None  ASSISTANTS: Surgical team   ANESTHESIA:   spinal  EBL:  Total I/O In: -  Out: 600 [Urine:600]  BLOOD ADMINISTERED:none  DRAINS: none   LOCAL MEDICATIONS USED:  NONE  SPECIMEN:  No Specimen  DISPOSITION OF SPECIMEN:  N/A  COUNTS:  YES  TOURNIQUET:  * No tourniquets in log *  DICTATION: .Other Dictation: Dictation Number [id number]  PLAN OF CARE: Admit to inpatient   PATIENT DISPOSITION:  PACU - hemodynamically stable.   Delay start of Pharmacological VTE agent (>24hrs) due to surgical blood loss or risk of bleeding: no

## 2012-03-17 NOTE — Progress Notes (Signed)
[facility] ED CM noted consult for CM Spoke with pt and grand daughter Luz Brazen in ED rm #6 (sons presently out of the room) Luz Brazen confirms pt lived alone but she would be the designated primary caregiver for pt upon d/c home. Reviewed CM consult for possible home health services at d/c Discussed differences in Home health and private duty nursing related to cost and time spent in the home. Carlie voiced understanding CM provided a list of [place] home health agencies to review and share with sons for discussion of agency choice Encouraged to ask questions of ED CM or Unit Cm  HOME HEALTH AGENCIES SERVING [facility]  Agencies that are Medicare-Certified and are affiliated with [facility]  Home Health Agency  Telephone Number  Address   [facility]  [facility] has ownership interest in this company; however, you are under no obligation to use this agency.  [phone] or  [phone]  [address]  [place], [place] [address]  [email or web address]   Agencies that are Medicare-Certified and are not affiliated with [facility]  Telephone Number  Address   [facility]  [phone]  Fax [phone]  [address]  [place], [place] [address]  [email or web address]   [facility]  [phone] or [phone]  Fax [phone]  [address]  [place], [place] [address]  [email or web address]   [facility]  [phone]  Fax [phone]  [address] [place], [place] [address]  [email or web address]   [facility]  [phone]  Fax [phone]  [address]  [place], [place] [address]  [email or web address]   [facility]  [facility]  [phone]  Fax [phone]  [address] [place], [place] [address]  [email or web address]   [facility]  [facility]  [phone]  [address]  [place], [place] [address]  [email or web address]   [facility]  [phone]  [address] [place], [place] [address]  [email or web address]   [facility]  [phone] or [phone]  Fax number [phone]  [address]  [place], [place] [address]  [email or web address]   [facility]  [phone]  Fax [phone]  [address]  [place], [place] [address]   [facility]  [phone]  Fax [phone]  [address]  [place], [place] [address]  [email or web address]

## 2012-03-17 NOTE — H&P (Signed)
[facility] History and Physical  [name] ZOX:[id number] DOB: [birth year] DOA: 03/17/2012  Referring physician:  Dr. Radford Pax PCP:  [name], MD  Dr. Charlann Boxer, Ortho  Chief Complaint:  Fall, hip fracture  HPI:  The patient is a 79 y.o. year-old female with history of CAD, CHF, COPD, HTN, breast cancer s/p XRT on tamoxifen, and recurrent falls who presents with a fall and left hip fracture.  At baseline, she ambulates with some difficulty due to weakness of the right leg after a previous hip fracture in 2011.  She is supposed to ambulate with a cane or walker, but does not.  She frequently gets her feet caught up/near falls at home per family and has tripped over some rugs in the house.  A few weeks ago, she reportedly had a fall that resulted in several rib fractures on the right (not visualized on CXR from 1/31 or today), and she has been given two prescriptions for vicodin which she has been taking.  She is also taking several benzodiazepine medications.  Today, she was at home alone when she was walking to the bathroom.  She told the ER physician that she was walking, then she woke up on the floor.  She tells me that she remembers falling, but then lost consciousness when she was on the floor. She woke up and was able to drag herself to the phone 2 hours later with 10/10 pain of the left hip.  Lives alone, but her family visits every day.    In the ER, Labs demonstrated mildly elevated creatinine from baseline of 0.9.  hgb 10.5 (slightly down from baseline of mid-11 range).   CXR demonstrated stable cardiomegaly.  XR hip demonstrated acute comminuted and angulated intertrochanteric fracture of the left femur.  Dr. Charlann Boxer was contacted by the ER staff regarding findings and he has requested she be kept NPO for possible surgery this afternoon.  Hospitalist called to admit to medicine for ongoing care.    Review of Systems:   She has been feeling warm today, but denies chills.  Mild vision  problems, no hearing problems.  No recent URI symptoms, sore throat, cough, congestion, shortness of breath, wheeze, chest pain, palpitations, nausea, vomiting, diarrhea.  SHe was recently to the ER for constipation and blood in her stools.  Denies dysuria, urinary frequency, urgency.  No other abnormal bruising or bleeding or lymphadenopathy.  No focal neurologic weakness or numbness that is new compared to 2011.  No recent slurred speech or confusion prior to being given pain medications in the ER.  Has some baseline anxiety, no depression.  Has swelling of the left ankle since a fall a few weeks ago.  Has mild bilateral lower extremity edema at baseline.  No PND or orthopnea.  Wears 2L [place] at night.    METS:  Can sustain 3-4 mets.   Anesthesia:  No previous complications reported  Past Medical History  Diagnosis Date  . Breast cancer 2007    lumpectomy, XRT previously on Tamoxifen   . Hyperlipidemia   . CHF (congestive heart failure)   . COPD (chronic obstructive pulmonary disease)     o2 at night  . Depression   . AAA (abdominal aortic aneurysm)   . Back pain     lumbar, chronic  . Hypertension   . Osteoporosis     dexa 02-18-08  . Insomnia   . Esophageal stricture   . Adenomatous colon polyp   . Hip fracture 08/2009  R, s/p ORIF  . Arthritis   . Ulcer   . Heart disease   . Diverticulosis   . Anxiety 02/15/2011  . Impaired glucose tolerance 02/18/2011  . Internal hemorrhoids   . 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 Presbyesophagus   . Gastritis   . Tubular adenoma    Past Surgical History  Procedure Date  . Oophorectomy     unilateral  . Knee arthroscopy     right   . Hemorrhoid surgery 1965  . Mastectomy, partial 2007    left  . Abdominal hysterectomy 1968  . Tonsillectomy 1968  . Rotator cuff repair   . Anal carcinoma resection   . Kyphosis surgery     balloon  . Cesarean section     x 2  . Hip fracture surgery 2011    right with plate with persistent  weakness of the RLE   Social History:  reports that she has been smoking Cigarettes.  She has smoked for the past .3 years. She has never used smokeless tobacco. She reports that she does not drink alcohol or use illicit drugs.  Lives by herself--son and granddaughter brings food, limited ADL since 7/11--- still drives.  She has a cane and walker at home, which she does not use.    Allergies  Allergen Reactions  . Ezetimibe-Simvastatin     REACTION: unspecified  . Gabapentin     REACTION: severe reaction, whelps  . Percocet (Oxycodone-Acetaminophen) Nausea And Vomiting    5/325 mg    Family History  Problem Relation Age of Onset  . Heart disease Mother   . Cancer Mother     bladder  . Colon cancer Father 76  . Heart disease Sister   . Breast cancer Sister   . Colon polyps Sister     Prior to Admission medications   Medication Sig Start Date End Date Taking? Authorizing Provider  albuterol (PROVENTIL HFA;VENTOLIN HFA) 108 (90 BASE) MCG/ACT inhaler Inhale 2 puffs into the lungs every 6 (six) hours as needed. For shortness of breath.   Yes Historical Provider, MD  ALPRAZolam Prudy Feeler) 0.5 MG tablet Take 0.25-0.5 mg by mouth 2 (two) times daily as needed. For anxiety.   Yes Historical Provider, MD  chlordiazePOXIDE (LIBRIUM) 5 MG capsule Take 5-10 mg by mouth 3 (three) times daily as needed. 1 cap in am, 1 cap in pm, 2 caps qhs if needed for anxiety.   Yes Historical Provider, MD  clonazePAM (KLONOPIN) 0.5 MG tablet Take 0.5 mg by mouth 3 (three) times daily as needed.  02/19/12  Yes Historical Provider, MD  fluticasone (FLONASE) 50 MCG/ACT nasal spray Place 2 sprays into the nose daily. 09/21/11  Yes Corwin Levins, MD  Fluticasone-Salmeterol (ADVAIR) 100-50 MCG/DOSE AEPB Inhale 1 puff into the lungs every 12 (twelve) hours. 09/12/11  Yes Corwin Levins, MD  HYDROcodone-acetaminophen [facility]) 10-325 MG per tablet Take 1 tablet by mouth every 8 (eight) hours as needed for pain. 03/14/12  Yes  Newt Lukes, MD  HYDROcodone-acetaminophen (NORCO/VICODIN) 5-325 MG per tablet Take 1 tablet by mouth every 6 (six) hours as needed for pain. 01/16/12  Yes Corwin Levins, MD  pravastatin (PRAVACHOL) 40 MG tablet Take 1 tablet (40 mg total) by mouth daily. 10/22/11  Yes Corwin Levins, MD  traMADol (ULTRAM) 50 MG tablet Take 50 mg by mouth every 6 (six) hours as needed.  02/15/12  Yes Historical Provider, MD   Physical Exam: Filed Vitals:  03/17/12 0842  BP: 125/57  Pulse: 97  SpO2: 94%     General:  Thin Caucasian female, no acute distress, resting on stretcher in pain  Eyes:  PERRL, anicteric, non-injected.  ENT:  Nares clear, nasal canula in place, OP clear, non-erythematous without plaques or exudates.  Dry MM.  Neck:  Supple without TM or JVD.  Has soft compressible mass in anterior right neck.   Lymph:  No cervical, supraclavicular, or submandibular LAD.  Cardiovascular:  RRR without m/r/g.  2/6 murmur at the lower LSB (heart sounds inferiorly displaced)  Respiratory:  Posterior rales that clear somewhat with repeat respiration and end-expiratory wheeze throughout with prolonged expiratory phase.  No rhonchi.    Abdomen:  NABS.  Soft, ND/NT.  Skin:  No rashes or focal lesions.    Musculoskeletal:  Normal bulk and tone.  1+ bilateral lower extremity edema with increased swelling of the left ankle without focal tenderness of the malleoli, naviculus, or base of 5th metatarsal.  Angulation of the left hip area without bruising currently and very TTP.  2+ lower extremity pulses.    Psychiatric:  Alert and oriented to person and place.  Normally A&Ox4, but confused slightly since being in pain and receiving pain medication  Neurologic:  CN 3-12 grossly intact.  5/5 strength upper extremities.  Pain with strength exam of lower extremities L>R, but can wiggle toes and ankles bilaterally and spontaneously moves both extremities.  Sensation intact.  Labs on Admission:  Basic  Metabolic Panel:  Lab 03/17/12 1191  NA 140  K 4.3  CL 103  CO2 --  GLUCOSE 97  BUN 12  CREATININE 0.90  CALCIUM --  MG --  PHOS --   Liver Function Tests: No results found for this basename: AST:5,ALT:5,ALKPHOS:5,BILITOT:5,PROT:5,ALBUMIN:5 in the last 168 hours No results found for this basename: LIPASE:5,AMYLASE:5 in the last 168 hours No results found for this basename: AMMONIA:5 in the last 168 hours CBC:  Lab 03/17/12 0836  WBC --  NEUTROABS --  HGB 10.5*  HCT 31.0*  MCV --  PLT --   Cardiac Enzymes: No results found for this basename: CKTOTAL:5,CKMB:5,CKMBINDEX:5,TROPONINI:5 in the last 168 hours  BNP (last 3 results) No results found for this basename: PROBNP:3 in the last 8760 hours CBG: No results found for this basename: GLUCAP:5 in the last 168 hours  Radiological Exams on Admission: Dg Chest 1 View  03/17/2012  *RADIOLOGY REPORT*  Clinical Data: CHF.  COPD.  Fall.  CHEST - 1 VIEW  Comparison: 03/14/2012.  Findings: Slight rightward rotation noted.  The cardiopericardial silhouette is enlarged. Hyperexpansion is consistent with emphysema. Interstitial markings are diffusely coarsened with chronic features.  No edema or focal airspace consolidation. Bones are diffusely demineralized.  IMPRESSION: Stable.  Cardiomegaly with emphysema.  No acute cardiopulmonary findings.   Original Report Authenticated By: Kennith Center, M.D.    Dg Hip Complete Left  03/17/2012  **ADDENDUM** CREATED: 03/17/2012 09:10:31  Speech recognition error on the initial dictation.  The impression should read "acute, comminuted and angulated intertrochanteric fracture of the left femur."  To stress, this patient does have an acute fracture.  **END ADDENDUM** SIGNED BY: Loraine Leriche E. Karin Golden, M.D.   03/17/2012  *RADIOLOGY REPORT*  Clinical Data: Fall.  Deformity.  LEFT HIP - COMPLETE 2+ VIEW  Comparison: 06/05/2006  Findings: There is an acute comminuted and laterally angulated intertrochanteric fracture of  the femur.  Lesser trochanteric fragments are displaced 2 cm.  Femoral neck is intact.  No degenerative  change of the hip joint.  IMPRESSION: No acute, comminuted and angulated intertrochanteric fracture of the left femur.   Original Report Authenticated By: Paulina Fusi, M.D.     EKG: pending  Assessment/Plan Principal Problem:  *Intertrochanteric fracture of left femur Active Problems:  HYPERLIPIDEMIA  ANEMIA, IRON DEFICIENCY  HYPERTENSION, BENIGN  CHF  COPD  BACK PAIN, LUMBAR, CHRONIC  OSTEOPOROSIS NOS  WEIGHT LOSS  Anxiety  Recurrent falls  Loss of consciousness  Left hip fracture: -  Dr. Charlann Boxer aware -  Defer weight bearing status, anticoagulation to orthopedics -  NPO for surgery -  Bed rest and foley catheter for now  Fall with LOC, no neck tenderness on exam.  Pt has history of heart disease so consider arrythmia, but appears hypovolemic, has weakness of the right leg chronically, pain in the left ankle acutely, is supposed to use a walker but does not, and has been taking benzodiazepines and narcotics, all of which likely contributed to her fall today.   -  CT head to rule out subdural -  ECG pending -  Troponin x 1 -  Admit to telemetry -  UA -  Hydrate -  Liberal narcotics to treat pain for now, but try to minimize and eliminate as quickly as possible after surgery -  Will wean benzodiazepines -  PT/OT consult -  SW consult for SNF placement  CHF, diastolic with preserved EF: - Monitor for hypervolemia HTN, on no medications with stable blood pressure HLD, stable. Hold statin for now and restart post operatively  COPD, with wheezing on exam -  Duonebs -  Continue ICS/LABA -  Incentive spirometry  Iron deficiency anemia, currently mildly decreased hgb -  Occult stool -  Restart iron  Weight loss: -  Nutrition consultation  Anxiety:   -  Wean benzodiazepines as tolerated  Patient at risk for hospital delirium -  Frequent reorientation -  Lights on  during day and off at night.    Diet:  NPO  Access:  PIV IVF:  NS at 125 ml/h Proph:  SCDs for now  Code Status: full code Family Communication: spoke with patient and her son and granddaughter.  NOK Brigitte Pulse, son, [phone].   Disposition Plan: admit to telemetry  Time spent: 21   Renae Fickle [facility] Pager [phone]  If 7PM-7AM, please contact night-coverage [email or web address] Password [facility] 03/17/2012, 10:48 AM

## 2012-03-17 NOTE — ED Notes (Addendum)
Patient transported to X-ray 

## 2012-03-17 NOTE — Preoperative (Signed)
Beta Blockers   Reason not to administer Beta Blockers:Not Applicable, not on home BB 

## 2012-03-17 NOTE — Anesthesia Procedure Notes (Signed)
Spinal  Patient location during procedure: OR Start time: 03/17/2012 4:08 PM End time: 03/17/2012 4:13 PM Staffing Anesthesiologist: Lewie Loron R Performed by: anesthesiologist  Preanesthetic Checklist Completed: patient identified, site marked, surgical consent, pre-op evaluation, timeout performed, IV checked, risks and benefits discussed and monitors and equipment checked Spinal Block Patient position: sitting Prep: ChloraPrep Patient monitoring: heart rate, continuous pulse ox and blood pressure Location: L2-3 Injection technique: single-shot Needle Needle type: Quincke  Needle gauge: 22 G Needle length: 9 cm Assessment Sensory level: T8 Additional Notes Expiration date of kit checked and confirmed. Patient tolerated procedure well, without complications.

## 2012-03-17 NOTE — Consult Note (Signed)
Reason for Consult: Left intertrochanteric femur fracture  Referring Physician:  Nelva Nay, MD, Short, M  Jean Mitchell is an 79 y.o. female.  HPI: 79 yo female patient of mine from previous right hip fracture.  She unfortunately had another fall today and was seen in the ER, radiographs revealed left intertroch femur fracture.  Based on my previous involvement the ER called me.  I graciously accepted to help her in this matter.  She will be admitted to medical service post-operatively.  Past Medical History  Diagnosis Date  . Breast cancer 2007    lumpectomy, XRT previously on Tamoxifen   . Hyperlipidemia   . CHF (congestive heart failure)   . COPD (chronic obstructive pulmonary disease)     o2 at night  . Depression   . AAA (abdominal aortic aneurysm)   . Back pain     lumbar, chronic  . Hypertension   . Osteoporosis     dexa 02-18-08  . Insomnia   . Esophageal stricture   . Adenomatous colon polyp   . Hip fracture 08/2009    R, s/p ORIF  . Arthritis   . Ulcer   . Heart disease   . Diverticulosis   . Anxiety 02/15/2011  . Impaired glucose tolerance 02/18/2011  . Internal hemorrhoids   . 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 Presbyesophagus   . Gastritis   . Tubular adenoma     Past Surgical History  Procedure Date  . Oophorectomy     unilateral  . Knee arthroscopy     right   . Hemorrhoid surgery 1965  . Mastectomy, partial 2007    left  . Abdominal hysterectomy 1968  . Tonsillectomy 1968  . Rotator cuff repair   . Anal carcinoma resection   . Kyphosis surgery     balloon  . Cesarean section     x 2  . Hip fracture surgery 2011    right with plate with persistent weakness of the RLE    Family History  Problem Relation Age of Onset  . Heart disease Mother   . Cancer Mother     bladder  . Colon cancer Father 12  . Heart disease Sister   . Breast cancer Sister   . Colon polyps Sister     Social History:  reports that she has been smoking  Cigarettes.  She has a 15 pack-year smoking history. She has never used smokeless tobacco. She reports that she does not drink alcohol or use illicit drugs.  Allergies:  Allergies  Allergen Reactions  . Ezetimibe-Simvastatin     REACTION: unspecified  . Gabapentin     REACTION: severe reaction, whelps  . Percocet (Oxycodone-Acetaminophen) Nausea And Vomiting    5/325 mg    Medications:  I have reviewed the patient's current medications. Scheduled:   . [MAR HOLD] ipratropium  0.5 mg Nebulization TID   And  . [XBJ HOLD] albuterol  2.5 mg Nebulization TID  . [facility] HOLD] docusate sodium  100 mg Oral BID  . [facility] HOLD] ferrous sulfate  325 mg Oral TID PC  . [MAR HOLD] fluticasone  2 spray Each Nare Daily  . [MAR HOLD] mometasone-formoterol  2 puff Inhalation BID  . [facility] HOLD] senna  1 tablet Oral BID  . vitamin A & D        Results for orders placed during the hospital encounter of 03/17/12 (from the past 24 hour(s))  PROTIME-INR     Status: Normal  Collection Time   03/17/12  8:26 AM      Component Value Range   Prothrombin Time 11.9  11.6 - 15.2 seconds   INR 0.88  0.00 - 1.49  POCT I-STAT, CHEM 8     Status: Abnormal   Collection Time   03/17/12  8:36 AM      Component Value Range   Sodium 140  135 - 145 mEq/L   Potassium 4.3  3.5 - 5.1 mEq/L   Chloride 103  96 - 112 mEq/L   BUN 12  6 - 23 mg/dL   Creatinine, Ser 1.61  0.50 - 1.10 mg/dL   Glucose, Bld 97  70 - 99 mg/dL   Calcium, Ion 0.96  0.45 - 1.30 mmol/L   TCO2 31  0 - 100 mmol/L   Hemoglobin 10.5 (*) 12.0 - 15.0 g/dL   HCT 40.9 (*) 81.1 - 91.4 %  CBC     Status: Abnormal   Collection Time   03/17/12 11:00 AM      Component Value Range   WBC 9.6  4.0 - 10.5 K/uL   RBC 3.27 (*) 3.87 - 5.11 MIL/uL   Hemoglobin 10.3 (*) 12.0 - 15.0 g/dL   HCT 78.2 (*) 95.6 - 21.3 %   MCV 96.9  78.0 - 100.0 fL   MCH 31.5  26.0 - 34.0 pg   MCHC 32.5  30.0 - 36.0 g/dL   RDW 08.6  57.8 - 46.9 %   Platelets 242  150 - 400 K/uL  POCT  I-STAT TROPONIN I     Status: Normal   Collection Time   03/17/12 11:09 AM      Component Value Range   Troponin i, poc 0.00  0.00 - 0.08 ng/mL   Comment 3           URINALYSIS, ROUTINE W REFLEX MICROSCOPIC     Status: Abnormal   Collection Time   03/17/12  1:39 PM      Component Value Range   Color, Urine YELLOW  YELLOW   APPearance CLEAR  CLEAR   Specific Gravity, Urine 1.015  1.005 - 1.030   pH 5.5  5.0 - 8.0   Glucose, UA NEGATIVE  NEGATIVE mg/dL   Hgb urine dipstick TRACE (*) NEGATIVE   Bilirubin Urine NEGATIVE  NEGATIVE   Ketones, ur NEGATIVE  NEGATIVE mg/dL   Protein, ur NEGATIVE  NEGATIVE mg/dL   Urobilinogen, UA 0.2  0.0 - 1.0 mg/dL   Nitrite NEGATIVE  NEGATIVE   Leukocytes, UA NEGATIVE  NEGATIVE  URINE MICROSCOPIC-ADD ON     Status: Normal   Collection Time   03/17/12  1:39 PM      Component Value Range   Squamous Epithelial / LPF RARE  RARE   RBC / HPF 0-2  <3 RBC/hpf   Bacteria, UA RARE  RARE  TYPE AND SCREEN     Status: Normal   Collection Time   03/17/12  2:01 PM      Component Value Range   ABO/RH(D) O POS     Antibody Screen NEG     Sample Expiration 03/20/2012      X-ray: Comminuted left intertrochanteric femure fracture   ROS: Other than left hip pain no other issues No reported syncopal episode No chest pain, SOB Other wise reviewed from medical admission  Blood pressure 111/54, pulse 95, temperature 99.1 F (37.3 C), temperature source Oral, resp. rate 20, height 5\' 5"  (1.651 m), weight 54 kg (119  lb 0.8 oz), SpO2 95.00%.  Awake alert pleasant Seen in OR holding area with her son Pain with movement of left hip Lungs clear Heart regular Abdomen soft  Assessment/Plan: Left intertrochanteric femur fracture  Plan to OR tonight for ORIF of left hip fracture Reviewed with son the same treatment that had occurred in past NPO Consent before  Jean Mitchell D 03/17/2012, 3:43 PM

## 2012-03-17 NOTE — Progress Notes (Signed)
INITIAL NUTRITION ASSESSMENT  DOCUMENTATION CODES Per approved criteria  -Not Applicable   INTERVENTION: 1. Advance diet per MD  2. Re-weigh pt and perform physical assessment  2. RD will continue to monitor pt   NUTRITION DIAGNOSIS: Increased nutrient needs related to healing as evidenced by hip fracture from fall.   Goal: Pt to meet >/= 90% of estimated needs.   Monitor:  Weight, PO intake   Reason for Assessment: Nutrition consult   79 y.o. female  Admitting Dx: Intertrochanteric fracture of left femur  ASSESSMENT: The patient is a 79 y.o. year-old female with history of CAD, CHF, COPD, HTN, breast cancer s/p XRT on tamoxifen, and recurrent falls who presents with a fall and left hip fracture.  When walking to the bathroom, pt fell and lost consciousness.  She woke up and was able to drag herself to the phone 2 hours later.  Pt lives alone but family visits every day.  Pt being kept NPO for possible surgery this afternoon.    Height: Ht Readings from Last 1 Encounters:  03/17/12 5\' 5"  (1.651 m)    Weight: Wt Readings from Last 1 Encounters:  03/17/12 119 lb 0.8 oz (54 kg)    Ideal Body Weight: 125 lbs   % Ideal Body Weight: 95%  Wt Readings from Last 10 Encounters:  03/17/12 119 lb 0.8 oz (54 kg)  03/17/12 119 lb 0.8 oz (54 kg)  03/14/12 113 lb 12.8 oz (51.619 kg)  02/25/12 110 lb 8 oz (50.122 kg)  02/01/12 111 lb 4 oz (50.463 kg)  11/05/11 110 lb (49.896 kg)  10/22/11 113 lb 6 oz (51.427 kg)  06/18/11 111 lb 8 oz (50.576 kg)  03/14/11 107 lb (48.535 kg)  02/27/11 107 lb (48.535 kg)    Usual Body Weight: ~110 lbs   % Usual Body Weight: 108%  BMI:  Body mass index is 19.81 kg/(m^2). - WNL   Estimated Nutritional Needs: Kcal: 1300 - 1500  Protein: 55 - 70 g daily  Fluid: 1.3 - 1.5 L  Skin: intact  Diet Order: NPO  EDUCATION NEEDS: -No education needs identified at this time  No intake or output data in the 24 hours ending 03/17/12  1445  Last BM: PTA   Labs:   Lab 03/17/12 0836  NA 140  K 4.3  CL 103  CO2 --  BUN 12  CREATININE 0.90  CALCIUM --  MG --  PHOS --  GLUCOSE 97    CBG (last 3)  No results found for this basename: GLUCAP:3 in the last 72 hours  Scheduled Meds:   . ipratropium  0.5 mg Nebulization TID   And  . albuterol  2.5 mg Nebulization TID  . docusate sodium  100 mg Oral BID  . ferrous sulfate  325 mg Oral TID PC  . fluticasone  2 spray Each Nare Daily  . mometasone-formoterol  2 puff Inhalation BID  . senna  1 tablet Oral BID  . vitamin A & D        Continuous Infusions:   . sodium chloride 125 mL/hr at 03/17/12 0845    Past Medical History  Diagnosis Date  . Breast cancer 2007    lumpectomy, XRT previously on Tamoxifen   . Hyperlipidemia   . CHF (congestive heart failure)   . COPD (chronic obstructive pulmonary disease)     o2 at night  . Depression   . AAA (abdominal aortic aneurysm)   . Back pain  lumbar, chronic  . Hypertension   . Osteoporosis     dexa 02-18-08  . Insomnia   . Esophageal stricture   . Adenomatous colon polyp   . Hip fracture 08/2009    R, s/p ORIF  . Arthritis   . Ulcer   . Heart disease   . Diverticulosis   . Anxiety 02/15/2011  . Impaired glucose tolerance 02/18/2011  . Internal hemorrhoids   . 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 Presbyesophagus   . Gastritis   . Tubular adenoma     Past Surgical History  Procedure Date  . Oophorectomy     unilateral  . Knee arthroscopy     right   . Hemorrhoid surgery 1965  . Mastectomy, partial 2007    left  . Abdominal hysterectomy 1968  . Tonsillectomy 1968  . Rotator cuff repair   . Anal carcinoma resection   . Kyphosis surgery     balloon  . Cesarean section     x 2  . Hip fracture surgery 2011    right with plate with persistent weakness of the RLE    Belenda Cruise  Dietetic Intern Pager: [phone]

## 2012-03-17 NOTE — Anesthesia Preprocedure Evaluation (Addendum)
Anesthesia Evaluation  Patient identified by MRN, date of birth, ID band Patient awake    Reviewed: Allergy & Precautions, H&P , NPO status , Patient's Chart, lab work & pertinent test results  Airway Mallampati: II TM Distance: >3 FB Neck ROM: Full    Dental  (+) Dental Advisory Given, Teeth Intact and Poor Dentition   Pulmonary COPD COPD inhaler,  + rhonchi   + wheezing      Cardiovascular hypertension, Pt. on medications + Peripheral Vascular Disease and +CHF Rhythm:Regular Rate:Normal     Neuro/Psych PSYCHIATRIC DISORDERS Anxiety Depression negative neurological ROS     GI/Hepatic Neg liver ROS, GERD-  Medicated,  Endo/Other  negative endocrine ROS  Renal/GU negative Renal ROS     Musculoskeletal negative musculoskeletal ROS (+)   Abdominal   Peds  Hematology negative hematology ROS (+)   Anesthesia Other Findings   Reproductive/Obstetrics                          Anesthesia Physical Anesthesia Plan  ASA: III  Anesthesia Plan: Spinal   Post-op Pain Management:    Induction:   Airway Management Planned: Simple Face Mask  Additional Equipment:   Intra-op Plan:   Post-operative Plan:   Informed Consent: I have reviewed the patients History and Physical, chart, labs and discussed the procedure including the risks, benefits and alternatives for the proposed anesthesia with the patient or authorized representative who has indicated his/her understanding and acceptance.   Dental advisory given  Plan Discussed with: CRNA  Anesthesia Plan Comments:         Anesthesia Quick Evaluation

## 2012-03-17 NOTE — ED Provider Notes (Signed)
History     CSN: [id number]  Arrival date & time 03/17/12  6578   First MD Initiated Contact with Patient 03/17/12 0801      Chief Complaint  Patient presents with  . Fall     HPI Per EMS pt at home and fell about 0430 this am. She laid on the floor for approximately 2 hours before being able to crawl to a phone to call for help. Pt was given Fentanyl over about 25 minutes in route and also Zofran 4mg . 20G in LFA NSL put in by EMS before arrival.  Patient denies chest pain, headache, shortness of breath.  Past Medical History  Diagnosis Date  . Breast cancer 2007    s/p XRT on Tamoxifen  . Hyperlipidemia   . CHF (congestive heart failure)   . COPD (chronic obstructive pulmonary disease)     o2 at night  . Depression   . AAA (abdominal aortic aneurysm)   . Back pain     lumbar, chronic  . Hypertension   . Osteoporosis     dexa 02-18-08  . Insomnia   . Esophageal stricture   . Adenomatous colon polyp   . Hip fracture 08/2009    R, s/p ORIF  . Arthritis   . Ulcer   . Heart disease   . Diverticulosis   . Anxiety 02/15/2011  . Impaired glucose tolerance 02/18/2011  . Internal hemorrhoids   . 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 Presbyesophagus   . Gastritis   . Tubular adenoma     Past Surgical History  Procedure Date  . Oophorectomy     unilateral  . Knee arthroscopy     right   . Hemorrhoid surgery 1965  . Mastectomy, partial 2007    left  . Abdominal hysterectomy 1968  . Tonsillectomy 1968  . Rotator cuff repair   . Anal carcinoma resection   . Kyphosis surgery     balloon  . Cesarean section     x 2  . Hip fracture surgery     left with plate    Family History  Problem Relation Age of Onset  . Heart disease Mother   . Cancer Mother     bladder  . Colon cancer Father 18  . Heart disease Sister   . Breast cancer Sister   . Colon polyps Sister     History  Substance Use Topics  . Smoking status: Former Smoker    Quit date: 02/13/2004   . Smokeless tobacco: Never Used  . Alcohol Use: No    OB History    Grav Para Term Preterm Abortions TAB SAB Ect Mult Living                  Review of Systems All other systems reviewed and are negative Allergies  Ezetimibe-simvastatin; Gabapentin; and Percocet  Home Medications   Current Outpatient Rx  Name  Route  Sig  Dispense  Refill  . ALBUTEROL SULFATE HFA 108 (90 BASE) MCG/ACT IN AERS   Inhalation   Inhale 2 puffs into the lungs every 6 (six) hours as needed. For shortness of breath.         . ALPRAZOLAM 0.5 MG PO TABS   Oral   Take 0.25-0.5 mg by mouth 2 (two) times daily as needed. For anxiety.         . CHLORDIAZEPOXIDE HCL 5 MG PO CAPS   Oral   Take 5-10 mg by mouth 3 (  three) times daily as needed. 1 cap in am, 1 cap in pm, 2 caps qhs if needed for anxiety.         . CLONAZEPAM 0.5 MG PO TABS   Oral   Take 0.5 mg by mouth 3 (three) times daily as needed.          Marland Kitchen FLUTICASONE PROPIONATE 50 MCG/ACT NA SUSP   Nasal   Place 2 sprays into the nose daily.   16 g   6   . FLUTICASONE-SALMETEROL 100-50 MCG/DOSE IN AEPB   Inhalation   Inhale 1 puff into the lungs every 12 (twelve) hours.   60 each   6   . HYDROCODONE-ACETAMINOPHEN 10-325 MG PO TABS   Oral   Take 1 tablet by mouth every 8 (eight) hours as needed for pain.   60 tablet   0   . HYDROCODONE-ACETAMINOPHEN 5-325 MG PO TABS   Oral   Take 1 tablet by mouth every 6 (six) hours as needed for pain.   120 tablet   2   . PRAVASTATIN SODIUM 40 MG PO TABS   Oral   Take 1 tablet (40 mg total) by mouth daily.   30 tablet   11   . PROAIR HFA 108 (90 BASE) MCG/ACT IN AERS      INHALE 2 PUFFS BY MOUTH EVERY 6 HOURS AS NEEDED FOR SHORTNESS OF BREATH.   8.5 g   2   . TRAMADOL HCL 50 MG PO TABS   Oral   Take 50 mg by mouth every 6 (six) hours as needed.            BP 125/57  Pulse 97  SpO2 94%  Physical Exam  Nursing note and vitals reviewed. Constitutional: She is oriented  to person, place, and time. She appears well-developed and well-nourished. She appears distressed (From left hip pain).  HENT:  Head: Normocephalic and atraumatic.  Eyes: Pupils are equal, round, and reactive to light.  Neck: Normal range of motion.  Cardiovascular: Normal rate and intact distal pulses.   Pulmonary/Chest: No respiratory distress.  Abdominal: Normal appearance. She exhibits no distension.  Musculoskeletal:       Left hip: She exhibits decreased range of motion, bony tenderness and deformity.  Neurological: She is alert and oriented to person, place, and time. No cranial nerve deficit.  Skin: Skin is warm and dry. No rash noted.  Psychiatric: She has a normal mood and affect. Her behavior is normal.    ED Course  Procedures (including critical care time)   Meds ordered this encounter  Medications  . HYDROmorphone (DILAUDID) injection 1 mg    Sig:   . ondansetron (ZOFRAN) injection 4 mg    Sig:   . 0.9 %  sodium chloride infusion    Sig:   . HYDROmorphone (DILAUDID) 1 MG/ML injection    Sig:     WYRICK, JESSICA: cabinet override  . morphine 2 MG/ML injection 1-2 mg    Sig:   . HYDROmorphone (DILAUDID) injection 1 mg    Sig:   . LORazepam (ATIVAN) injection 0.5 mg    Sig:     Labs Reviewed  POCT I-STAT, CHEM 8 - Abnormal; Notable for the following:    Hemoglobin 10.5 (*)     HCT 31.0 (*)     All other components within normal limits  PROTIME-INR  URINALYSIS, ROUTINE W REFLEX MICROSCOPIC   Dg Chest 1 View  03/17/2012  *RADIOLOGY REPORT*  Clinical Data:  CHF.  COPD.  Fall.  CHEST - 1 VIEW  Comparison: 03/14/2012.  Findings: Slight rightward rotation noted.  The cardiopericardial silhouette is enlarged. Hyperexpansion is consistent with emphysema. Interstitial markings are diffusely coarsened with chronic features.  No edema or focal airspace consolidation. Bones are diffusely demineralized.  IMPRESSION: Stable.  Cardiomegaly with emphysema.  No acute  cardiopulmonary findings.   Original Report Authenticated By: Kennith Center, M.D.    Dg Hip Complete Left  03/17/2012  **ADDENDUM** CREATED: 03/17/2012 09:10:31  Speech recognition error on the initial dictation.  The impression should read "acute, comminuted and angulated intertrochanteric fracture of the left femur."  To stress, this patient does have an acute fracture.  **END ADDENDUM** SIGNED BY: Loraine Leriche E. Karin Golden, M.D.   03/17/2012  *RADIOLOGY REPORT*  Clinical Data: Fall.  Deformity.  LEFT HIP - COMPLETE 2+ VIEW  Comparison: 06/05/2006  Findings: There is an acute comminuted and laterally angulated intertrochanteric fracture of the femur.  Lesser trochanteric fragments are displaced 2 cm.  Femoral neck is intact.  No degenerative change of the hip joint.  IMPRESSION: No acute, comminuted and angulated intertrochanteric fracture of the left femur.   Original Report Authenticated By: Paulina Fusi, M.D.      1. Closed left hip fracture   2. Syncope       MDM         Nelia Shi, MD 03/17/12 1228

## 2012-03-17 NOTE — ED Notes (Signed)
Per EMS pt at home and fell about 0430 this am. She laid on the floor for approximately 2 hours before being able to crawl to a phone to call for help. Pt was given Fentanyl over about 25 minutes in route and also Zofran 4mg . 20G in LFA NSL put in by EMS before arrival

## 2012-03-17 NOTE — Anesthesia Postprocedure Evaluation (Signed)
Anesthesia Post Note  Patient: Jean Mitchell  Procedure(s) Performed: Procedure(s) (LRB): INTRAMEDULLARY (IM) NAIL FEMORAL (Left)  Anesthesia type: Spinal  Patient location: PACU  Post pain: Pain level controlled  Post assessment: Post-op Vital signs reviewed  Last Vitals: BP 131/67  Pulse 98  Temp 36.8 C (Oral)  Resp 19  Ht 5\' 5"  (1.651 m)  Wt 119 lb 0.8 oz (54 kg)  BMI 19.81 kg/m2  SpO2 92%  Post vital signs: Reviewed  Level of consciousness: sedated  Complications: No apparent anesthesia complications

## 2012-03-17 NOTE — Progress Notes (Signed)
Pt arrived at 13:20 and c/o of pain 10/10, states "my hip hurts, but I have to pee so bad". Foley immediately placed and return of 850cc yellow cloudy urine with sediment returned. UA sent as ordered; will notify Dr. Malachi Bonds and inquire about urine culture.   Pt states much relief of discomfort.

## 2012-03-17 NOTE — Progress Notes (Signed)
INITIAL NUTRITION ASSESSMENT  DOCUMENTATION CODES Per approved criteria  -Not Applicable   INTERVENTION: 1. Advance diet per MD  2. Re-weigh pt and perform physical assessment if appropriate 2. RD will continue to monitor pt   NUTRITION DIAGNOSIS: Increased nutrient needs related to healing as evidenced by hip fracture from fall.   Goal: Pt to meet >/= 90% of estimated needs.   Monitor:  Weight, PO intake   Reason for Assessment: Nutrition consult   79 y.o. female  Admitting Dx: Intertrochanteric fracture of left femur  ASSESSMENT: The patient is a 79 y.o. year-old female with history of CAD, CHF, COPD, HTN, breast cancer s/p XRT on tamoxifen, and recurrent falls who presents with a fall and left hip fracture.  When walking to the bathroom, pt fell and lost consciousness.  She woke up and was able to drag herself to the phone 2 hours later.  Pt lives alone but family visits every day.   Pt being transported to surgery at time of visit.  Family reports wt loss, however admission wt was 119 lbs.  Per family, her usual wt is 110 lbs which is consistent with chart review. Note pt also had another fall last week   Height: Ht Readings from Last 1 Encounters:  03/17/12 5\' 5"  (1.651 m)    Weight: Wt Readings from Last 1 Encounters:  03/17/12 119 lb 0.8 oz (54 kg)    Ideal Body Weight: 125 lbs   % Ideal Body Weight: 95%  Wt Readings from Last 10 Encounters:  03/17/12 119 lb 0.8 oz (54 kg)  03/17/12 119 lb 0.8 oz (54 kg)  03/14/12 113 lb 12.8 oz (51.619 kg)  02/25/12 110 lb 8 oz (50.122 kg)  02/01/12 111 lb 4 oz (50.463 kg)  11/05/11 110 lb (49.896 kg)  10/22/11 113 lb 6 oz (51.427 kg)  06/18/11 111 lb 8 oz (50.576 kg)  03/14/11 107 lb (48.535 kg)  02/27/11 107 lb (48.535 kg)    Usual Body Weight: ~110 lbs   % Usual Body Weight: 108%  BMI:  Body mass index is 19.81 kg/(m^2). - WNL   Estimated Nutritional Needs: Kcal: 1300 - 1500  Protein: 55 - 70 g daily   Fluid: 1.3 - 1.5 L  Skin: intact  Diet Order: NPO  EDUCATION NEEDS: -No education needs identified at this time  No intake or output data in the 24 hours ending 03/17/12 1611  Last BM: PTA   Labs:   Lab 03/17/12 0836  NA 140  K 4.3  CL 103  CO2 --  BUN 12  CREATININE 0.90  CALCIUM --  MG --  PHOS --  GLUCOSE 97    CBG (last 3)  No results found for this basename: GLUCAP:3 in the last 72 hours  Scheduled Meds:    . [MAR HOLD] ipratropium  0.5 mg Nebulization TID   And  . [ZOX HOLD] albuterol  2.5 mg Nebulization TID  . [facility] HOLD] docusate sodium  100 mg Oral BID  . [facility] HOLD] ferrous sulfate  325 mg Oral TID PC  . [MAR HOLD] fluticasone  2 spray Each Nare Daily  . [MAR HOLD] mometasone-formoterol  2 puff Inhalation BID  . [facility] HOLD] senna  1 tablet Oral BID  . vitamin A & D        Continuous Infusions:    . sodium chloride 125 mL/hr at 03/17/12 0845    Past Medical History  Diagnosis Date  . Breast cancer 2007  lumpectomy, XRT previously on Tamoxifen   . Hyperlipidemia   . CHF (congestive heart failure)   . COPD (chronic obstructive pulmonary disease)     o2 at night  . Depression   . AAA (abdominal aortic aneurysm)   . Back pain     lumbar, chronic  . Hypertension   . Osteoporosis     dexa 02-18-08  . Insomnia   . Esophageal stricture   . Adenomatous colon polyp   . Hip fracture 08/2009    R, s/p ORIF  . Arthritis   . Ulcer   . Heart disease   . Diverticulosis   . Anxiety 02/15/2011  . Impaired glucose tolerance 02/18/2011  . Internal hemorrhoids   . 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 Presbyesophagus   . Gastritis   . Tubular adenoma     Past Surgical History  Procedure Date  . Oophorectomy     unilateral  . Knee arthroscopy     right   . Hemorrhoid surgery 1965  . Mastectomy, partial 2007    left  . Abdominal hysterectomy 1968  . Tonsillectomy 1968  . Rotator cuff repair   . Anal carcinoma resection   . Kyphosis  surgery     balloon  . Cesarean section     x 2  . Hip fracture surgery 2011    right with plate with persistent weakness of the RLE   Loyce Dys, MS RD LDN Clinical Inpatient Dietitian Pager: [phone] Weekend/After hours pager: [phone]

## 2012-03-17 NOTE — Care Management Note (Signed)
    Page 1 of 1   03/20/2012     11:28:55 AM   CARE MANAGEMENT NOTE 03/20/2012  Patient:  Jean Mitchell, Jean Mitchell   Account Number:  [id number]  Date Initiated:  03/17/2012  Documentation initiated by:  [facility]  Subjective/Objective Assessment:   ADMITTED W/FALL.R HIP FX.ZO:XWRU,EAV,WUJ,WJX.     Action/Plan:   FROM HOME ALONE.HAS PCP,PHARMACY.   Anticipated DC Date:  03/20/2012   Anticipated DC Plan:  SKILLED NURSING FACILITY      DC Planning Services  CM consult      Choice offered to / List presented to:  C-4 Adult Children           Status of service:  Completed, signed off Medicare Important Message given?   (If response is "NO", the following Medicare IM given date fields will be blank) Date Medicare IM given:   Date Additional Medicare IM given:    Discharge Disposition:  SKILLED NURSING FACILITY  Per UR Regulation:  Reviewed for med. necessity/level of care/duration of stay  If discussed at Long Length of Stay Meetings, dates discussed:    Comments:  03/20/12 Henryetta Corriveau RN,BSN NCM [phone] PER IP REHAB-NO REHAB BEDS AVAILABLE.D/C SNF.  03/18/12 Kaylor Simenson RN,BSN NCM [phone] PT-CIR/HH.OT-CIR/SNF.IP REHAB CONS.SW ALSO FOLLOWING.S/P L HIP ORIF.  03/17/12 Annlouise Gerety RN,BSN NCM [phone] PER ED CASE [facility](CARLIE) HAS ALREADY BEEN PROVIDED W/[facility]HC AGENCY LIST AS A RESOURCE.ORTHO FOLLOWING FOR SX.AWAIT POST SX FOR RECOMMENDATIONS-PT/OT CONS,& D/C PLANS.

## 2012-03-17 NOTE — ED Notes (Signed)
Pt lives at home alone. She feel sometime this morning around 0430. She says that she does not remember falling and thinks that maybe she may have blacked out because the last thing that she remembers is going to the bathroom and waking up on the floor. C/O pain 9/10 in her left leg. Weak pedal pluses in LLE, positive for +1 pitting edema in BLE. UTA whether there is bruising to the left hip d/t sheet that is wrapped around her hips. VSS, placed on 2l/[place] for 02 sat of 79%. Pt does wear 02 at home only at night.

## 2012-03-18 ENCOUNTER — Encounter (HOSPITAL_COMMUNITY): Payer: Self-pay | Admitting: Orthopedic Surgery

## 2012-03-18 ENCOUNTER — Inpatient Hospital Stay (HOSPITAL_COMMUNITY): Payer: Medicare Other

## 2012-03-18 DIAGNOSIS — I714 Abdominal aortic aneurysm, without rupture: Secondary | ICD-10-CM

## 2012-03-18 DIAGNOSIS — R109 Unspecified abdominal pain: Secondary | ICD-10-CM

## 2012-03-18 LAB — URINE CULTURE
Colony Count: NO GROWTH
Culture: NO GROWTH

## 2012-03-18 LAB — BASIC METABOLIC PANEL
BUN: 14 mg/dL (ref 6–23)
Chloride: 102 mEq/L (ref 96–112)
GFR calc Af Amer: >90 mL/min (ref >90)
Potassium: 4 mEq/L (ref 3.5–5.1)

## 2012-03-18 LAB — CBC
HCT: 28.3 % — ABNORMAL LOW (ref 36.0–46.0)
Hemoglobin: 9.3 g/dL — ABNORMAL LOW (ref 12.0–15.0)
MCHC: 32.9 g/dL (ref 30.0–36.0)

## 2012-03-18 MED ORDER — SIMVASTATIN 10 MG PO TABS
10.0000 mg | ORAL_TABLET | Freq: Every day | ORAL | Status: DC
  Administered 2012-03-18 – 2012-03-19 (×2): 10 mg via ORAL
  Filled 2012-03-18 (×3): qty 1

## 2012-03-18 MED ORDER — LORAZEPAM 2 MG/ML IJ SOLN: 0.5000 mg | Freq: Two times a day (BID) | INTRAMUSCULAR | Status: DC | PRN

## 2012-03-18 MED ORDER — HYDROCODONE-ACETAMINOPHEN 5-325 MG PO TABS
1.0000 | ORAL_TABLET | ORAL | Status: DC | PRN
  Administered 2012-03-18 – 2012-03-20 (×7): 1 via ORAL
  Filled 2012-03-18 (×7): qty 1

## 2012-03-18 MED ORDER — MORPHINE SULFATE 2 MG/ML IJ SOLN
1.0000 mg | INTRAMUSCULAR | Status: DC | PRN
  Administered 2012-03-18 – 2012-03-19 (×3): 1 mg via INTRAVENOUS
  Filled 2012-03-18 (×3): qty 1

## 2012-03-18 MED ORDER — SODIUM CHLORIDE 0.9 % IV SOLN
INTRAVENOUS | Status: AC
  Administered 2012-03-18: 13:00:00 via INTRAVENOUS

## 2012-03-18 MED ORDER — CLONAZEPAM 0.5 MG PO TABS
0.5000 mg | ORAL_TABLET | Freq: Two times a day (BID) | ORAL | Status: DC
  Administered 2012-03-18 – 2012-03-20 (×5): 0.5 mg via ORAL
  Filled 2012-03-18 (×5): qty 1

## 2012-03-18 MED ORDER — MAGNESIUM HYDROXIDE 400 MG/5ML PO SUSP
30.0000 mL | Freq: Three times a day (TID) | ORAL | Status: AC
  Administered 2012-03-18 – 2012-03-19 (×3): 30 mL via ORAL
  Filled 2012-03-18 (×3): qty 30

## 2012-03-18 NOTE — Evaluation (Signed)
Clinical/Bedside Swallow Evaluation Patient Details  Name: Jean Mitchell MRN: [id number] Date of Birth: 11-19-1927  Today's Date: 03/18/2012 Time: 1030-1105 SLP Time Calculation (min): 35 min  Past Medical History:  Past Medical History  Diagnosis Date  . Breast cancer 2007    lumpectomy, XRT previously on Tamoxifen   . Hyperlipidemia   . CHF (congestive heart failure)   . COPD (chronic obstructive pulmonary disease)     o2 at night  . Depression   . AAA (abdominal aortic aneurysm)   . Back pain     lumbar, chronic  . Hypertension   . Osteoporosis     dexa 02-18-08  . Insomnia   . Esophageal stricture   . Adenomatous colon polyp   . Hip fracture 08/2009    R, s/p ORIF  . Arthritis   . Ulcer   . Heart disease   . Diverticulosis   . Anxiety 02/15/2011  . Impaired glucose tolerance 02/18/2011  . Internal hemorrhoids   . 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 Presbyesophagus   . Gastritis   . Tubular adenoma    Past Surgical History:  Past Surgical History  Procedure Date  . Oophorectomy     unilateral  . Knee arthroscopy     right   . Hemorrhoid surgery 1965  . Mastectomy, partial 2007    left  . Abdominal hysterectomy 1968  . Tonsillectomy 1968  . Rotator cuff repair   . Anal carcinoma resection   . Kyphosis surgery     balloon  . Cesarean section     x 2  . Hip fracture surgery 2011    right with plate with persistent weakness of the RLE  . Femur im nail 03/17/2012    Procedure: INTRAMEDULLARY (IM) NAIL FEMORAL;  Surgeon: Shelda Pal, MD;  Location: [facility];  Service: Orthopedics;  Laterality: Left;   HPI:  79 yo female adm to [facility] after falling with subsequent hip fx- s/p surgery. Pt PMH + for breast cancer x/p XRT, CAD,CHF, COPD, HTN, gastritis, ulcer, presbyesophagus.   Pt reports having dilatation done approx 5 years ago.  Pt reports chronic dysphagia over several years without significant worsening.  She reports intermittent problems with swallowing meats  and breads.  Today pt had sensation of food stasis in esophagus and when she tried to clear it with liquids,  both food and drink came back up.  Chronic constipation also acknowledged for which pt takes laxatives.  Last bowel movement reportedly 3-4 days ago.     Assessment / Plan / Recommendation Clinical Impression  Pt presents with functional oropharyngeal swallow abilities.  No clinical indication of aspiration with water, applesauce or soft cracker. Pt did require verbal cue to take small bites, her granddaughter stated she has been telling pt for years to take small bites.    Note extensive esophageal history and rec for barium swallow study per Dr Juanda Chance in December 2013.  Pt agreeable to esophagram if MD desires.  Called and obtained order for esophagram.    Constipation also reported  by pt, lack of bowel movement x3-4 days- informed primary MD and xray of this issue given plan for esophagram.   Today SLP reviewed results of previous testing and GI recommendations with pt.  Also provided written reflux precautions, xerostomia tips and strategies to compensate for dysmotility issues.  Pt and granddaughter verbalized understanding.      SLP to follow for education re: COPD impacting swallow coordination.  Aspiration Risk  Mild    Diet Recommendation Regular;Thin liquid   Liquid Administration via: Straw;Cup Medication Administration: Whole meds with liquid Supervision: Patient able to self feed Compensations: Slow rate;Small sips/bites;Follow solids with liquid (consume liquids throughout meal) Postural Changes and/or Swallow Maneuvers: Seated upright 90 degrees;Upright 30-60 min after meal    Other  Recommendations Oral Care Recommendations: Oral care BID   Follow Up Recommendations       Frequency and Duration min 1 x/week  1 week   Pertinent Vitals/Pain Afebrile, decreased, Dg Chest 1 View  03/17/2012  *RADIOLOGY REPORT*  Clinical Data: CHF.  COPD.  Fall.  CHEST - 1 VIEW   Comparison: 03/14/2012.  Findings: Slight rightward rotation noted.  The cardiopericardial silhouette is enlarged. Hyperexpansion is consistent with emphysema. Interstitial markings are diffusely coarsened with chronic features.  No edema or focal airspace consolidation. Bones are diffusely demineralized.  IMPRESSION: Stable.  Cardiomegaly with emphysema.  No acute cardiopulmonary findings.   Original Report Authenticated By: Kennith Center, M.D.    Dg Hip Complete Left  03/17/2012  **ADDENDUM** CREATED: 03/17/2012 09:10:31  Speech recognition error on the initial dictation.  The impression should read "acute, comminuted and angulated intertrochanteric fracture of the left femur."  To stress, this patient does have an acute fracture.  **END ADDENDUM** SIGNED BY: Loraine Leriche E. Karin Golden, M.D.   03/17/2012  *RADIOLOGY REPORT*  Clinical Data: Fall.  Deformity.  LEFT HIP - COMPLETE 2+ VIEW  Comparison: 06/05/2006  Findings: There is an acute comminuted and laterally angulated intertrochanteric fracture of the femur.  Lesser trochanteric fragments are displaced 2 cm.  Femoral neck is intact.  No degenerative change of the hip joint.  IMPRESSION: No acute, comminuted and angulated intertrochanteric fracture of the left femur.   Original Report Authenticated By: Paulina Fusi, M.D.    Dg Hip Operative Left  03/18/2012  *RADIOLOGY REPORT*  Clinical Data: Left hip fracture.  OPERATIVE LEFT HIP  Comparison: 03/17/2012.  Findings: Three intraoperative views of the left hip submitted for review after surgery.  This reveals reduction of the left intertrochanteric fracture with dynamic compression type screw with distal fixation.  No complication noted.  Lesser trochanter remains as a separate fracture fragment.  IMPRESSION: Open reduction and internal fixation left intratrochanteric fracture.   Original Report Authenticated By: Lacy Duverney, M.D.    Ct Head Wo Contrast  03/17/2012  *RADIOLOGY REPORT*  Clinical Data: Fall with loss of  consciousness and headache.  CT HEAD WITHOUT CONTRAST  Technique:  Contiguous axial images were obtained from the base of the skull through the vertex without contrast.  Comparison: 03/30/2011.  Findings: No evidence of acute infarct, acute hemorrhage, mass lesion, mass effect or hydrocephalus.  Minimal atrophy. No air fluid levels in the visualized portions of the paranasal sinuses and mastoid air cells.  IMPRESSION: No acute intracranial abnormality.   Original Report Authenticated By: Leanna Battles, M.D.    Dg Pelvis Portable  03/17/2012  *RADIOLOGY REPORT*  Clinical Data: Postoperative left hip.  PORTABLE PELVIS  Comparison: 03/01/2012  Findings: Left intramedullary nail placement noted traversing the intertrochanteric fracture.  Displaced lesser trochanteric fragment observed.  Distal interlocking screw of the new left intramedullary nail noted.  No new fracture or complicating feature.  Old right proximal femoral intramedullary nail noted.  IMPRESSION:  1.  Left hip ORIF with intramedullary nail traversing the intertrochanteric fracture, without complicating feature.   Original Report Authenticated By: Gaylyn Rong, M.D.       SLP Swallow Goals Patient  will utilize recommended strategies during swallow to increase swallowing safety with: Minimal assistance   Swallow Study Prior Functional Status  Type of Home: Apartment Lives With: Spouse Available Help at Discharge: Family;Available PRN/intermittently Vocation: Retired    General Date of Onset: 03/18/12 HPI: 79 yo female adm to [facility] after falling with subsequent hip fx- s/p surgery. Pt PMH + for breast cancer x/p XRT, CAD,CHF, COPD, HTN, gastritis, ulcer, presbyesophagus.   Pt reports having dilatation done approx 5 years ago.  Pt reports chronic dysphagia over several years without significant worsening.  She reports intermittent problems with swallowing meats and breads.  Today pt had sensation of food stasis in esophagus and when she  tried to clear it with liquids,  both food and drink came back up.  Chronic constipation also acknowledged for which pt takes laxatives.  Last bowel movement reportedly 3-4 days ago.   Type of Study: Bedside swallow evaluation Previous Swallow Assessment: endoscopy:  Jan 2013  gastritis, no obstruction, suspect motility problems, rec soft/reflux prudent diet Diet Prior to this Study: Regular;Thin liquids Temperature Spikes Noted: No Respiratory Status: Supplemental O2 delivered via (comment) (on oxygen at night at home) Behavior/Cognition: Alert;Cooperative;Pleasant mood Oral Cavity - Dentition: Adequate natural dentition Self-Feeding Abilities: Able to feed self Patient Positioning: Partially reclined (due to surgery) Baseline Vocal Quality: Clear Volitional Cough: Strong Volitional Swallow: Able to elicit    Oral/Motor/Sensory Function Overall Oral Motor/Sensory Function: Appears within functional limits for tasks assessed   Ice Chips Ice chips: Not tested   Thin Liquid Thin Liquid: Within functional limits Presentation: Straw;Self Fed    Nectar Thick Nectar Thick Liquid: Not tested   Honey Thick Honey Thick Liquid: Not tested   Puree Puree: Within functional limits Presentation: Self Fed;Spoon Other Comments: pt needed cues to take small bites   Solid   GO    Solid: Within functional limits Presentation: Self Fed Other Comments: softened cracker       Donavan Burnet, MS [facility] SLP [phone]

## 2012-03-18 NOTE — Evaluation (Signed)
Occupational Therapy Evaluation Patient Details Name: Jean Mitchell MRN: [id number] DOB: 10-28-1927 Today's Date: 03/18/2012 Time: 7829-5621 OT Time Calculation (min): 25 min  OT Assessment / Plan / Recommendation Clinical Impression  Pt s/p L IM nail after sustaining fx from fall at home. Pt initially was refusing snf placement but was more receptive to CIR at [facility]. Pt will need 24/7 A if to return home. Skilled OT indicated to maximize independence with BADLs to supervision/min a level in prep for d/c to next venue.    OT Assessment  Patient needs continued OT Services    Follow Up Recommendations  CIR;SNF;Supervision/Assistance - 24 hour (If pt refuses than HHOT.)    Barriers to Discharge Decreased caregiver support    Equipment Recommendations  3 in 1 bedside comode    Recommendations for Other Services    Frequency  Min 2X/week    Precautions / Restrictions Precautions Precautions: Fall Restrictions Weight Bearing Restrictions: Yes LLE Weight Bearing: Partial weight bearing LLE Partial Weight Bearing Percentage or Pounds: 50%   Pertinent Vitals/Pain Pt reported 8/10 L hip pain. Repositioned for comfort.    ADL  Grooming: Set up Where Assessed - Grooming: Unsupported sitting Upper Body Bathing: Minimal assistance Where Assessed - Upper Body Bathing: Unsupported sitting Lower Body Bathing: Maximal assistance Where Assessed - Lower Body Bathing: Supported sit to stand Upper Body Dressing: Minimal assistance Where Assessed - Upper Body Dressing: Unsupported sitting Lower Body Dressing: +1 Total assistance Where Assessed - Lower Body Dressing: Supported sit to Pharmacist, hospital: Moderate assistance Toilet Transfer Method: Other (comment) (to recliner.) Toileting - Clothing Manipulation and Hygiene: Maximal assistance Where Assessed - Toileting Clothing Manipulation and Hygiene: Standing Equipment Used: Rolling walker ADL Comments: Pt limited by pain and fatigues  quickly. Pt only took a few pivotal steps from bed to chair.    OT Diagnosis: Generalized weakness  OT Problem List: Decreased activity tolerance;Decreased safety awareness;Decreased knowledge of use of DME or AE;Pain OT Treatment Interventions: Self-care/ADL training;Therapeutic activities;DME and/or AE instruction;Patient/family education   OT Goals Acute Rehab OT Goals OT Goal Formulation: With patient/family Time For Goal Achievement: 04/01/12 Potential to Achieve Goals: Good ADL Goals Pt Will Perform Grooming: Standing at sink;Other (comment) (with minguard A) ADL Goal: Grooming - Progress: Goal set today Pt Will Perform Upper Body Bathing: with set-up;Sitting, chair;Sitting, edge of bed;Unsupported ADL Goal: Upper Body Bathing - Progress: Goal set today Pt Will Perform Lower Body Bathing: with min assist;Sit to stand from chair;Sit to stand from bed ADL Goal: Lower Body Bathing - Progress: Goal set today Pt Will Perform Upper Body Dressing: with set-up;Sitting, chair;Sitting, bed;Unsupported ADL Goal: Upper Body Dressing - Progress: Goal set today Pt Will Perform Lower Body Dressing: with min assist;Sit to stand from bed;Sit to stand from chair ADL Goal: Lower Body Dressing - Progress: Goal set today Pt Will Transfer to Toilet: with DME;Ambulation;3-in-1;Other (comment) (with minguard A) ADL Goal: Toilet Transfer - Progress: Goal set today Pt Will Perform Toileting - Clothing Manipulation: with min assist;Sitting on 3-in-1 or toilet;Standing ADL Goal: Toileting - Clothing Manipulation - Progress: Goal set today Pt Will Perform Toileting - Hygiene: with min assist;Sit to stand from 3-in-1/toilet ADL Goal: Toileting - Hygiene - Progress: Goal set today  Visit Information  Last OT Received On: 03/18/12 Assistance Needed: +2 PT/OT Co-Evaluation/Treatment: Yes    Subjective Data  Subjective: I'm not going to a nursing home.... Patient Stated Goal: Return home with family  assist.   Prior Functioning  Home Living Lives With: Spouse Available Help at Discharge: Family;Available PRN/intermittently Type of Home: Apartment Home Access: Level entry Home Layout: One level Bathroom Shower/Tub: Engineer, manufacturing systems: Standard Home Adaptive Equipment: Shower chair with back;Walker - rolling;Straight cane Prior Function Level of Independence: Independent Driving: Yes Vocation: Retired Musician: No difficulties Dominant Hand: Right         Vision/Perception     Copywriter, advertising Overall Cognitive Status: Appears within functional limits for tasks assessed/performed Arousal/Alertness: Awake/alert Orientation Level: Appears intact for tasks assessed Behavior During Session: [facility] for tasks performed    Extremity/Trunk Assessment Right Upper Extremity Assessment RUE ROM/Strength/Tone: [facility] for tasks assessed Left Upper Extremity Assessment LUE ROM/Strength/Tone: WFL for tasks assessed Right Lower Extremity Assessment RLE ROM/Strength/Tone: [facility] for tasks assessed Left Lower Extremity Assessment LLE ROM/Strength/Tone: Deficits;Due to pain LLE ROM/Strength/Tone Deficits: assist required for bed mobility, able to perform ankle pumps, good quad contraction     Mobility Bed Mobility Bed Mobility: Supine to Sit Supine to Sit: 4: Min assist;HOB elevated Details for Bed Mobility Assistance: assist for L LE, verbal cues for technique, slight assist for scoot to EOB Transfers Sit to Stand: 3: Mod assist;With upper extremity assist;From bed Stand to Sit: To chair/3-in-1;With upper extremity assist;3: Mod assist Details for Transfer Assistance: +2 for safety, verbal cues for PWB, using UEs through RW, hand placement, and pivoting feet     Exercise    Balance     End of Session OT - End of Session Equipment Utilized During Treatment: Gait belt Activity Tolerance: Patient limited by pain;Patient limited by  fatigue Patient left: in chair;with call bell/phone within reach;with family/visitor present  GO     Kailea Dannemiller A OTR/L [phone] 03/18/2012, 2:01 PM

## 2012-03-18 NOTE — Progress Notes (Signed)
Rehab Admissions Coordinator Note:  Patient was screened by Brock Ra for appropriateness for an Inpatient Acute Rehab Consult.  At this time, we are recommending Inpatient Rehab consult.  Brock Ra 03/18/2012, 2:08 PM  I can be reached at [phone].

## 2012-03-18 NOTE — Evaluation (Signed)
Physical Therapy Evaluation Patient Details Name: Jean Mitchell MRN: [id number] DOB: 07-17-1927 Today's Date: 03/18/2012 Time: 4098-1191 PT Time Calculation (min): 24 min  PT Assessment / Plan / Recommendation Clinical Impression  Pt s/p L IM nail after sustaining fx from fall at home.  Pt would benefit from acute PT services in order to improve independence with transfers and ambulation and increasing strength of L LE to prepare for d/c to next venue.  Granddaughter and pt both state pt will not d/c to SNF and will consider CIR, if not CIR then HHPT.  Also reviewed PWB and need for using RW as well as ankle pumps, quad sets, and glut sets x10-15 reps.    PT Assessment  Patient needs continued PT services    Follow Up Recommendations  Supervision/Assistance - 24 hour;CIR;Home health PT    Does the patient have the potential to tolerate intense rehabilitation      Barriers to Discharge        Equipment Recommendations  Wheelchair (measurements PT) (depending on if d/c home and progress)    Recommendations for Other Services     Frequency Min 3X/week    Precautions / Restrictions Precautions Precautions: Fall Restrictions Weight Bearing Restrictions: Yes LLE Weight Bearing: Partial weight bearing LLE Partial Weight Bearing Percentage or Pounds: 50%   Pertinent Vitals/Pain 5/10 L hip pain, had pain meds already this morning, repositioned to comfort      Mobility  Bed Mobility Bed Mobility: Supine to Sit Supine to Sit: 4: Min assist;HOB elevated Details for Bed Mobility Assistance: assist for L LE, verbal cues for technique, slight assist for scoot to EOB Transfers Transfers: Stand to Sit;Sit to Stand;Stand Pivot Transfers Sit to Stand: 3: Mod assist;With upper extremity assist;From bed Stand to Sit: To chair/3-in-1;With upper extremity assist;3: Mod assist Stand Pivot Transfers: 3: Mod assist Details for Transfer Assistance: +2 for safety, verbal cues for PWB, using UEs  through RW, hand placement, and pivoting feet Ambulation/Gait Ambulation/Gait Assistance: Not tested (comment) (pt felt unable to take steps today)    Exercises Total Joint Exercises Ankle Circles/Pumps: AROM;Both;10 reps;Supine Quad Sets: AROM;Both;5 reps;Supine Gluteal Sets: AROM;Both;5 reps;Supine   PT Diagnosis: Difficulty walking;Acute pain  PT Problem List: Decreased strength;Decreased activity tolerance;Decreased mobility;Decreased knowledge of use of DME;Pain;Decreased knowledge of precautions PT Treatment Interventions: DME instruction;Gait training;Functional mobility training;Therapeutic activities;Therapeutic exercise;Patient/family education   PT Goals Acute Rehab PT Goals PT Goal Formulation: With patient Time For Goal Achievement: 04/01/12 Potential to Achieve Goals: Good Pt will go Supine/Side to Sit: with supervision PT Goal: Supine/Side to Sit - Progress: Goal set today Pt will go Sit to Supine/Side: with supervision PT Goal: Sit to Supine/Side - Progress: Goal set today Pt will go Sit to Stand: with supervision PT Goal: Sit to Stand - Progress: Goal set today Pt will go Stand to Sit: with supervision PT Goal: Stand to Sit - Progress: Goal set today Pt will Ambulate: 16 - 50 feet;with supervision;with rolling walker PT Goal: Ambulate - Progress: Goal set today Pt will Perform Home Exercise Program: with supervision, verbal cues required/provided PT Goal: Perform Home Exercise Program - Progress: Goal set today  Visit Information  Last PT Received On: 03/18/12 Assistance Needed: +2    Subjective Data  Subjective: I don't know where I would be without her.  (granddaughter) Patient Stated Goal: home   Prior Functioning  Home Living Lives With: Spouse Available Help at Discharge: Family;Available PRN/intermittently Type of Home: Apartment Home Access: Level entry Home Layout:  One level Bathroom Shower/Tub: Engineer, manufacturing systems: Standard Home  Adaptive Equipment: Shower chair with back;Walker - rolling;Straight cane Prior Function Level of Independence: Independent Driving: Yes Vocation: Retired Musician: No difficulties Dominant Hand: Right    Cognition  Cognition Overall Cognitive Status: Appears within functional limits for tasks assessed/performed Arousal/Alertness: Awake/alert Orientation Level: Appears intact for tasks assessed Behavior During Session: [facility] for tasks performed    Extremity/Trunk Assessment Right Lower Extremity Assessment RLE ROM/Strength/Tone: [facility] for tasks assessed Left Lower Extremity Assessment LLE ROM/Strength/Tone: Deficits;Due to pain LLE ROM/Strength/Tone Deficits: assist required for bed mobility, able to perform ankle pumps, good quad contraction   Balance    End of Session PT - End of Session Equipment Utilized During Treatment: Gait belt Activity Tolerance: Patient limited by pain Patient left: in chair;with call bell/phone within reach;with family/visitor present;Other (comment) (nsg students in room) Nurse Communication: Mobility status (+2 or lift)  GP     Stanly Si,KATHrine E 03/18/2012, 12:32 PM  Zenovia Jarred, PT, DPT 03/18/2012 Pager: [phone]

## 2012-03-18 NOTE — Progress Notes (Signed)
[facility]                                                                                Patient Demographics  Jean Mitchell, is a 79 y.o. female  ZOX:[id number]  [id number]:[id number]  DOB - 02-15-1927  Admit date - 03/17/2012  Admitting Physician Renae Fickle, MD  Outpatient Primary MD for the patient is [name], MD  LOS - 1   Chief Complaint  Patient presents with  . Fall        Assessment & Plan     Left hip fracture:  is post open reduction internal fixation by Dr. Johney Frame on 03/17/2012, patient tolerated the procedure well, continue on Lovenox, commence PT OT , has history of recurrent falls and may need placement      Fall with LOC, no neck tenderness on exam. Pt has history of heart disease so consider arrythmia, but appears hypovolemic, has weakness of the right leg chronically, pain in the left ankle acutely, is supposed to use a walker but does not, and has been taking benzodiazepines and narcotics, all of which likely contributed to her fall today. CT of the head is stable, troponin is negative, EKG is unremarkable and she is stable on telemetry so far, most by Alan Ripper fall is due to excessive use of sedative medications which should be titrated down, continue gentle IV fluids for hydration, PT OT and placement, gentle down titration of her sedating medications.  Monitor pending EEG ordered upon admission.      Chronic diastolic CHF with EF preserved on echogram in 2011 EF was 50%- Monitor for hypervolemia , IV fluids for now monitor clinically.    HTN, on no medications with stable blood pressure     HLD, stable. Tinea home dose statin    COPD, with no wheezing on exam  - Duonebs  - Continue ICS/LABA  - Incentive spirometry     Iron deficiency anemia, currently mildly decreased hgb  - Occult stool  - Restart iron     Weight loss:  - Nutrition consultation     Anxiety:  - Wean benzodiazepines as tolerated       Patient at risk for hospital delirium  - Frequent reorientation  - Lights on during day and off at night.    Code Status: Full  Family Communication: Discussed with the patient  Disposition Plan: To be decided   Procedures open reduction internal fixation of left femur by Dr. Charlann Boxer on 03/17/2012   Consults  Ortho   DVT Prophylaxis  Lovenox    Lab Results  Component Value Date   PLT 214 03/18/2012    Medications  Scheduled Meds:   . ipratropium  0.5 mg Nebulization TID   And  . albuterol  2.5 mg Nebulization TID  . budesonide (PULMICORT) nebulizer solution  0.5 mg Nebulization BID  . docusate sodium  100 mg Oral BID  . enoxaparin (LOVENOX) injection  40 mg Subcutaneous Q24H  . ferrous sulfate  325 mg Oral TID PC  . fluticasone  2 spray Each Nare Daily  . senna  1 tablet Oral BID   Continuous Infusions:  PRN Meds:.HYDROcodone-acetaminophen, LORazepam, menthol-cetylpyridinium, morphine injection,  ondansetron (ZOFRAN) IV, phenol  Antibiotics     Anti-infectives     Start     Dose/Rate Route Frequency Ordered Stop   03/17/12 2200   ceFAZolin (ANCEF) IVPB 2 g/50 mL premix        2 g 100 mL/hr over 30 Minutes Intravenous Every 6 hours 03/17/12 1814 03/18/12 0423           Time Spent in minutes 35   Raydon Chappuis K M.D on 03/18/2012 at 10:58 AM  Between 7am to 7pm - Pager - [phone]  After 7pm go to [email or web address] - password [email or web address]  And look for the night coverage person covering for me after hours  [facility] Office  [phone]    Subjective:   Jean Mitchell today has, No headache, No chest pain, No abdominal pain - No Nausea, No new weakness tingling or numbness, No Cough - SOB. Some left hip pain.  Objective:   Filed Vitals:   03/18/12 0751 03/18/12 0754 03/18/12 0800 03/18/12 0945  BP: 95/45     Pulse: 86     Temp: 98.1 F (36.7 C)     TempSrc: Oral     Resp: 22  20   Height:      Weight:      SpO2: 95% 98% 94% 94%     Wt Readings from Last 3 Encounters:  03/17/12 54 kg (119 lb 0.8 oz)  03/17/12 54 kg (119 lb 0.8 oz)  03/14/12 51.619 kg (113 lb 12.8 oz)     Intake/Output Summary (Last 24 hours) at 03/18/12 1058 Last data filed at 03/18/12 0600  Gross per 24 hour  Intake   1950 ml  Output   2060 ml  Net   -110 ml    Exam Awake Alert, Oriented X 3, No new F.N deficits, Normal affect Willis.AT,PERRAL Supple Neck,No JVD, No cervical lymphadenopathy appriciated.  Symmetrical Chest wall movement, Good air movement bilaterally, CTAB RRR,No Gallops,Rubs or new Murmurs, No Parasternal Heave +ve B.Sounds, Abd Soft, Non tender, No organomegaly appriciated, No rebound - guarding or rigidity. No Cyanosis, Clubbing or edema, No new Rash or bruise    Data Review   Micro Results Recent Results (from the past 240 hour(s))  SURGICAL PCR SCREEN     Status: Normal   Collection Time   03/17/12  3:01 PM      Component Value Range Status Comment   MRSA, PCR NEGATIVE  NEGATIVE Final    Staphylococcus aureus NEGATIVE  NEGATIVE Final     Radiology Reports Dg Chest 1 View  03/17/2012  *RADIOLOGY REPORT*  Clinical Data: CHF.  COPD.  Fall.  CHEST - 1 VIEW  Comparison: 03/14/2012.  Findings: Slight rightward rotation noted.  The cardiopericardial silhouette is enlarged. Hyperexpansion is consistent with emphysema. Interstitial markings are diffusely coarsened with chronic features.  No edema or focal airspace consolidation. Bones are diffusely demineralized.  IMPRESSION: Stable.  Cardiomegaly with emphysema.  No acute cardiopulmonary findings.   Original Report Authenticated By: Kennith Center, M.D.    Dg Chest 2 View  03/14/2012  *RADIOLOGY REPORT*  Clinical Data: COPD.  Fall 5 days ago.  CHEST - 2 VIEW  Comparison: 03/30/2011.  Findings: Advanced changes of COPD with hyperexpansion and flattening of hemidiaphragms.  The pulmonary arteries are also prominent centrally.  No contusions, effusions or pneumothoraces. Bullous  changes at the apices.  Chronic reticular changes at the bases.  Focal nodular density along the left heart border new from the prior study.  The heart, mediastinal hilar contours are normal. The bony thorax shows no acute changes.  Advanced degenerative changes in the shoulders. Previous vertebroplasty or kyphoplasty on several thoracic and lumbar vertebrae.  No evidence of new compression fractures.  IMPRESSION: No evidence of acute trauma.  Advanced COPD.  No acute changes. Faint, nodular density along the left heart border.  This could be a nipple shadow.  Nonetheless, follow-up radiographs with nipple markers or chest CT recommended.   Original Report Authenticated By: Sander Radon, M.D.    Dg Ribs Unilateral Right  03/14/2012  *RADIOLOGY REPORT*  Clinical Data: Right anterior rib/chest pain.  Fall 5 days ago.  RIGHT RIBS - 2 VIEW  Comparison: 03/15/2011 chest radiograph.  Findings: No pneumothorax.  Emphysema.  No displaced rib fractures are identified.  Postprocedural changes of multilevel vertebral augmentation.  IMPRESSION: No displaced rib fractures or pneumothorax.   Original Report Authenticated By: Andreas Newport, M.D.    Dg Hip Complete Left  03/17/2012  **ADDENDUM** CREATED: 03/17/2012 09:10:31  Speech recognition error on the initial dictation.  The impression should read "acute, comminuted and angulated intertrochanteric fracture of the left femur."  To stress, this patient does have an acute fracture.  **END ADDENDUM** SIGNED BY: Loraine Leriche E. Karin Golden, M.D.   03/17/2012  *RADIOLOGY REPORT*  Clinical Data: Fall.  Deformity.  LEFT HIP - COMPLETE 2+ VIEW  Comparison: 06/05/2006  Findings: There is an acute comminuted and laterally angulated intertrochanteric fracture of the femur.  Lesser trochanteric fragments are displaced 2 cm.  Femoral neck is intact.  No degenerative change of the hip joint.  IMPRESSION: No acute, comminuted and angulated intertrochanteric fracture of the left femur.   Original  Report Authenticated By: Paulina Fusi, M.D.    Dg Hip Operative Left  03/18/2012  *RADIOLOGY REPORT*  Clinical Data: Left hip fracture.  OPERATIVE LEFT HIP  Comparison: 03/17/2012.  Findings: Three intraoperative views of the left hip submitted for review after surgery.  This reveals reduction of the left intertrochanteric fracture with dynamic compression type screw with distal fixation.  No complication noted.  Lesser trochanter remains as a separate fracture fragment.  IMPRESSION: Open reduction and internal fixation left intratrochanteric fracture.   Original Report Authenticated By: Lacy Duverney, M.D.    Dg Abd 1 View  03/01/2012  *RADIOLOGY REPORT*  Clinical Data: Abdominal pain, constipation, rectal bleeding  ABDOMEN - 1 VIEW  Comparison: None.  Findings:  Large colonic stool burden, with likely fecal impaction within the rectum and distal sigmoid colon.  No definite evidence of obstruction.  No supine evidence of pneumoperitoneum.  No definite pneumatosis or portal venous gas.  Limited visualization of the lower thorax is normal.  Multilevel upper lumbar spine vertebroplasty/kyphoplasty.  Post ORIF of the right femur and right femoral neck.  IMPRESSION: Large colonic stool burden with likely distal fecal impaction.   Original Report Authenticated By: Tacey Ruiz, MD    Ct Head Wo Contrast  03/17/2012  *RADIOLOGY REPORT*  Clinical Data: Fall with loss of consciousness and headache.  CT HEAD WITHOUT CONTRAST  Technique:  Contiguous axial images were obtained from the base of the skull through the vertex without contrast.  Comparison: 03/30/2011.  Findings: No evidence of acute infarct, acute hemorrhage, mass lesion, mass effect or hydrocephalus.  Minimal atrophy. No air fluid levels in the visualized portions of the paranasal sinuses and mastoid air cells.  IMPRESSION: No acute intracranial abnormality.   Original Report Authenticated By: Leanna Battles, M.D.    Dg Pelvis  Portable  03/17/2012   *RADIOLOGY REPORT*  Clinical Data: Postoperative left hip.  PORTABLE PELVIS  Comparison: 03/01/2012  Findings: Left intramedullary nail placement noted traversing the intertrochanteric fracture.  Displaced lesser trochanteric fragment observed.  Distal interlocking screw of the new left intramedullary nail noted.  No new fracture or complicating feature.  Old right proximal femoral intramedullary nail noted.  IMPRESSION:  1.  Left hip ORIF with intramedullary nail traversing the intertrochanteric fracture, without complicating feature.   Original Report Authenticated By: Gaylyn Rong, M.D.     CBC  Lab 03/18/12 0454 03/17/12 1100 03/17/12 0836  WBC 6.3 9.6 --  HGB 9.3* 10.3* 10.5*  HCT 28.3* 31.7* 31.0*  PLT 214 242 --  MCV 96.6 96.9 --  MCH 31.7 31.5 --  MCHC 32.9 32.5 --  RDW 13.3 13.3 --  LYMPHSABS -- -- --  MONOABS -- -- --  EOSABS -- -- --  BASOSABS -- -- --  BANDABS -- -- --    Chemistries   Lab 03/18/12 0454 03/17/12 0836  NA 137 140  K 4.0 4.3  CL 102 103  CO2 30 --  GLUCOSE 157* 97  BUN 14 12  CREATININE 0.55 0.90  CALCIUM 8.4 --  MG -- --  AST -- --  ALT -- --  ALKPHOS -- --  BILITOT -- --   ------------------------------------------------------------------------------------------------------------------ estimated creatinine clearance is 46.2 ml/min (by C-G formula based on Cr of 0.55). ------------------------------------------------------------------------------------------------------------------ No results found for this basename: HGBA1C:2 in the last 72 hours ------------------------------------------------------------------------------------------------------------------ No results found for this basename: CHOL:2,HDL:2,LDLCALC:2,TRIG:2,CHOLHDL:2,LDLDIRECT:2 in the last 72 hours ------------------------------------------------------------------------------------------------------------------ No results found for this basename:  TSH,T4TOTAL,FREET3,T3FREE,THYROIDAB in the last 72 hours ------------------------------------------------------------------------------------------------------------------ No results found for this basename: VITAMINB12:2,FOLATE:2,FERRITIN:2,TIBC:2,IRON:2,RETICCTPCT:2 in the last 72 hours  Coagulation profile  Lab 03/17/12 0826  INR 0.88  PROTIME --    No results found for this basename: DDIMER:2 in the last 72 hours  Cardiac Enzymes No results found for this basename: CK:3,CKMB:3,TROPONINI:3,MYOGLOBIN:3 in the last 168 hours ------------------------------------------------------------------------------------------------------------------ No components found with this basename: POCBNP:3

## 2012-03-18 NOTE — Op Note (Signed)
NAMECHAE, SHUSTER NO.:  [id number]  MEDICAL RECORD NO.:  [id number]  LOCATION:  1405                         FACILITY:  [facility]  PHYSICIAN:  Madlyn Frankel. Charlann Boxer, M.D.  DATE OF BIRTH:  12/27/1927  DATE OF PROCEDURE:  03/17/2012 DATE OF DISCHARGE:                              OPERATIVE REPORT   PREOPERATIVE DIAGNOSIS:  Left intertrochanteric femur fracture.  POSTOPERATIVE DIAGNOSIS:  Left intertrochanteric femur fracture.  PROCEDURE:  Left hip open reduction and internal fixation utilizing the [employer] Affixus intramedullary nail, 11 x 180 mm with a 130-degree angle with 100-mm lag screw and the distal interlock.  SURGEON:  Madlyn Frankel. Charlann Boxer, M.D.  ASSISTANT:  Surgical team.  ANESTHESIA:  Spinal.  BLOOD LOSS:  Probably about 100-150 mL.  DRAINS:  None.  COMPLICATIONS:  None.  SPECIMENS:  None.  INDICATIONS FOR PROCEDURE:  Ms. Spargur is an 79 year old female, who presented to the emergency room after a ground level fall.  Ms. Pete had been a patient of mine from previous right hip fracture that was treated in the same manner.  She was noted radiographically to have an intertrochanteric femur fracture.  Medical team was called to admit her; however, they contacted [place] based on previous involvement with her care. She was seen and evaluated in the holding area.  No other complaints. No other concerns.  No other medical issues to worry about other than that reported by the medical admission.  I reviewed with she and her son, the risks and benefits, the necessity of the procedures and based on the previous engagement, they were prepared to proceed.  Risks of nonunion, infection, DVT, component failure, need for future revision surgery were all reviewed prior to consent obtained.  PROCEDURE IN DETAIL:  The patient was brought to the operative theater. Once adequate anesthesia, preoperative antibiotics, Ancef administered, she was positioned supine on the fracture  table.  Her right lower extremity was flexed and abducted out of the way with bony prominences were padded.  The left foot was placed into the foot holder.  Traction and internal rotation applied against the padded perineal post.  Fluoroscopy was brought to the field confirming reduced fracture.  At this point, the left hip was prepped and draped from the iliac crest to just above the knee using a shower curtain technique.  Time-out was performed identifying the patient, planned procedure, and extremity. Landmarks were identified under fluoroscopy and an incision was made proximal tip of the trochanter.  The guidewire was then inserted in the tip of the trochanter and passed across the fracture site.  I drilled the proximal femur and then passed the intramedullary nail by hand.  At this point, incision was made laterally for the lag screw.  The guidewire was inserted through the guide on the insertion jig.  The guidewire was put in position to the center of the head in the AP and lateral planes.  I measured depth, selected 100-mm lag screw.  I then drilled for the lag screw.  The lag screw was then passed and once in appropriate position, I did use the compression wheel and medialized the shaft to this fracture.  At this  point, a 36-mm distal interlock screw was placed through the jig.  Proximally, the locking bolt was tightened down, then backed off a quarter of turn to allow some compression.  At this point, the jig was removed.  Final radiographs were obtained in AP and lateral planes.  The wounds were irrigated.  The proximal wound was closed in layers with #1 Vicryl and the gluteal fascia.  The subcutaneous layers were closed with 2-0 Vicryl and staples on the skin.  The skin was cleaned, dried and dressed sterilely using Mepilex dressing.  She was then taken carefully off of the fracture table and brought to the recovery room in stable condition.  She tolerated the procedure  well.  I will make her partial weightbearing until the first 2-6 weeks depending on how she is progressing based on bone quality.     Madlyn Frankel Charlann Boxer, M.D.     MDO/MEDQ  D:  03/17/2012  T:  03/18/2012  Job:  [id number]

## 2012-03-18 NOTE — Consult Note (Signed)
Physical Medicine and Rehabilitation Consult Reason for Consult: Left intertrochanteric femur fracture Referring Physician: [facility]   HPI: Jean Mitchell is a 79 y.o. right-handed female with history of coronary artery disease, diastolic congestive heart failure, breast cancer with radiation therapy as well as recurrent falls. At baseline she ambulates with some difficulty due to weakness of the right leg after previous hip fracture in 2011 using a cane or walker. Admitted 03/17/2012 after a fall at home while ambulating to the bathroom. She denied any loss of consciousness but did stay on the floor for an extended time. Cranial CT scan showed no acute abnormalities. X-rays and imaging revealed left intertrochanteric femur fracture and underwent ORIF 03/18/2012 per Dr. Charlann Boxer. Placed on subcutaneous Lovenox for DVT prophylaxis. Advise partial weightbearing left lower extremity. Postoperative anemia 9.3 and monitored. Physical and occupational therapy evaluations completed an ongoing with recommendations of physical medicine rehabilitation consult to consider inpatient rehabilitation services   Review of Systems  Respiratory: Positive for cough.   Gastrointestinal:       Reflux  Musculoskeletal: Positive for back pain and falls.  Neurological:       Dysphagia  Psychiatric/Behavioral: Positive for depression. The patient has insomnia.        Anxiety  All other systems reviewed and are negative.   Past Medical History  Diagnosis Date  . Breast cancer 2007    lumpectomy, XRT previously on Tamoxifen   . Hyperlipidemia   . CHF (congestive heart failure)   . COPD (chronic obstructive pulmonary disease)     o2 at night  . Depression   . AAA (abdominal aortic aneurysm)   . Back pain     lumbar, chronic  . Hypertension   . Osteoporosis     dexa 02-18-08  . Insomnia   . Esophageal stricture   . Adenomatous colon polyp   . Hip fracture 08/2009    R, s/p ORIF  . Arthritis   . Ulcer   . Heart  disease   . Diverticulosis   . Anxiety 02/15/2011  . Impaired glucose tolerance 02/18/2011  . Internal hemorrhoids   . 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 Presbyesophagus   . Gastritis   . Tubular adenoma    Past Surgical History  Procedure Date  . Oophorectomy     unilateral  . Knee arthroscopy     right   . Hemorrhoid surgery 1965  . Mastectomy, partial 2007    left  . Abdominal hysterectomy 1968  . Tonsillectomy 1968  . Rotator cuff repair   . Anal carcinoma resection   . Kyphosis surgery     balloon  . Cesarean section     x 2  . Hip fracture surgery 2011    right with plate with persistent weakness of the RLE  . Femur im nail 03/17/2012    Procedure: INTRAMEDULLARY (IM) NAIL FEMORAL;  Surgeon: Shelda Pal, MD;  Location: [facility];  Service: Orthopedics;  Laterality: Left;   Family History  Problem Relation Age of Onset  . Heart disease Mother   . Cancer Mother     bladder  . Colon cancer Father 28  . Heart disease Sister   . Breast cancer Sister   . Colon polyps Sister    Social History:  reports that she has been smoking Cigarettes.  She has a 15 pack-year smoking history. She has never used smokeless tobacco. She reports that she does not drink alcohol or use illicit drugs. Allergies:  Allergies  Allergen Reactions  . Ezetimibe-Simvastatin     REACTION: unspecified  . Gabapentin     REACTION: severe reaction, whelps  . Percocet (Oxycodone-Acetaminophen) Nausea And Vomiting    5/325 mg   Medications Prior to Admission  Medication Sig Dispense Refill  . albuterol (PROVENTIL HFA;VENTOLIN HFA) 108 (90 BASE) MCG/ACT inhaler Inhale 2 puffs into the lungs every 6 (six) hours as needed. For shortness of breath.      . ALPRAZolam (XANAX) 0.5 MG tablet Take 0.25-0.5 mg by mouth 2 (two) times daily as needed. For anxiety.      . chlordiazePOXIDE (LIBRIUM) 5 MG capsule Take 5-10 mg by mouth 3 (three) times daily as needed. 1 cap in am, 1 cap in pm, 2 caps qhs if  needed for anxiety.      . clonazePAM (KLONOPIN) 0.5 MG tablet Take 0.5 mg by mouth 3 (three) times daily as needed.       . fluticasone (FLONASE) 50 MCG/ACT nasal spray Place 2 sprays into the nose daily.  16 g  6  . Fluticasone-Salmeterol (ADVAIR) 100-50 MCG/DOSE AEPB Inhale 1 puff into the lungs every 12 (twelve) hours.  60 each  6  . HYDROcodone-acetaminophen (NORCO) 10-325 MG per tablet Take 1 tablet by mouth every 8 (eight) hours as needed for pain.  60 tablet  0  . HYDROcodone-acetaminophen (NORCO/VICODIN) 5-325 MG per tablet Take 1 tablet by mouth every 6 (six) hours as needed for pain.  120 tablet  2  . pravastatin (PRAVACHOL) 40 MG tablet Take 1 tablet (40 mg total) by mouth daily.  30 tablet  11  . traMADol (ULTRAM) 50 MG tablet Take 50 mg by mouth every 6 (six) hours as needed.         Home: Home Living Lives With: Spouse Available Help at Discharge: Family;Available PRN/intermittently Type of Home: Apartment Home Access: Level entry Home Layout: One level Bathroom Shower/Tub: Engineer, manufacturing systems: Standard Home Adaptive Equipment: Shower chair with back;Walker - rolling;Straight cane  Functional History: Prior Function Driving: Yes Vocation: Retired Functional Status:  Mobility: Bed Mobility Bed Mobility: Supine to Sit Supine to Sit: 4: Min assist;HOB elevated Transfers Transfers: Stand to Sit;Sit to Stand;Stand Pivot Transfers Sit to Stand: 3: Mod assist;With upper extremity assist;From bed Stand to Sit: To chair/3-in-1;With upper extremity assist;3: Mod assist Stand Pivot Transfers: 3: Mod assist Ambulation/Gait Ambulation/Gait Assistance: Not tested (comment) (pt felt unable to take steps today)    ADL: ADL Grooming: Set up Where Assessed - Grooming: Unsupported sitting Upper Body Bathing: Minimal assistance Where Assessed - Upper Body Bathing: Unsupported sitting Lower Body Bathing: Maximal assistance Where Assessed - Lower Body Bathing:  Supported sit to stand Upper Body Dressing: Minimal assistance Where Assessed - Upper Body Dressing: Unsupported sitting Lower Body Dressing: +1 Total assistance Where Assessed - Lower Body Dressing: Supported sit to Pharmacist, hospital: Moderate assistance Toilet Transfer Method: Other (comment) (to recliner.) Equipment Used: Rolling walker ADL Comments: Pt limited by pain and fatigues quickly. Pt only took a few pivotal steps from bed to chair.  Cognition: Cognition Arousal/Alertness: Awake/alert Orientation Level: Oriented X4 Cognition Overall Cognitive Status: Appears within functional limits for tasks assessed/performed Arousal/Alertness: Awake/alert Orientation Level: Appears intact for tasks assessed Behavior During Session: Circles Of Care for tasks performed  Blood pressure 104/43, pulse 88, temperature 99.1 F (37.3 C), temperature source Oral, resp. rate 20, height 5\' 5"  (1.651 m), weight 54 kg (119 lb 0.8 oz), SpO2 96.00%. Physical Exam  Constitutional: She is oriented to  person, place, and time.  HENT:  Head: Normocephalic.  Eyes: EOM are normal.  Neck: Normal range of motion. Neck supple. No thyromegaly present.  Cardiovascular: Normal rate and regular rhythm.   Pulmonary/Chest: Effort normal and breath sounds normal. No respiratory distress. She has no wheezes.  Abdominal: Soft. Bowel sounds are normal. She exhibits no distension.  Neurological: She is alert and oriented to person, place, and time. She displays normal reflexes. No cranial nerve deficit. Coordination normal.       Follows simple commands. Alert and appropriate. Limited with LLE movement due to pain. Otherwise motor function within normal limits  Skin:       Hip incision dressed  Psychiatric: She has a normal mood and affect. Her behavior is normal. Judgment and thought content normal.    Results for orders placed during the hospital encounter of 03/17/12 (from the past 24 hour(s))  SURGICAL PCR SCREEN      Status: Normal   Collection Time   03/17/12  3:01 PM      Component Value Range   MRSA, PCR NEGATIVE  NEGATIVE   Staphylococcus aureus NEGATIVE  NEGATIVE  BASIC METABOLIC PANEL     Status: Abnormal   Collection Time   03/18/12  4:54 AM      Component Value Range   Sodium 137  135 - 145 mEq/L   Potassium 4.0  3.5 - 5.1 mEq/L   Chloride 102  96 - 112 mEq/L   CO2 30  19 - 32 mEq/L   Glucose, Bld 157 (*) 70 - 99 mg/dL   BUN 14  6 - 23 mg/dL   Creatinine, Ser 7.82  0.50 - 1.10 mg/dL   Calcium 8.4  8.4 - 95.6 mg/dL   GFR calc non Af Amer 85 (*) >90 mL/min   GFR calc Af Amer >90  >90 mL/min  CBC     Status: Abnormal   Collection Time   03/18/12  4:54 AM      Component Value Range   WBC 6.3  4.0 - 10.5 K/uL   RBC 2.93 (*) 3.87 - 5.11 MIL/uL   Hemoglobin 9.3 (*) 12.0 - 15.0 g/dL   HCT 21.3 (*) 08.6 - 57.8 %   MCV 96.6  78.0 - 100.0 fL   MCH 31.7  26.0 - 34.0 pg   MCHC 32.9  30.0 - 36.0 g/dL   RDW 46.9  62.9 - 52.8 %   Platelets 214  150 - 400 K/uL   Dg Chest 1 View  03/17/2012  *RADIOLOGY REPORT*  Clinical Data: CHF.  COPD.  Fall.  CHEST - 1 VIEW  Comparison: 03/14/2012.  Findings: Slight rightward rotation noted.  The cardiopericardial silhouette is enlarged. Hyperexpansion is consistent with emphysema. Interstitial markings are diffusely coarsened with chronic features.  No edema or focal airspace consolidation. Bones are diffusely demineralized.  IMPRESSION: Stable.  Cardiomegaly with emphysema.  No acute cardiopulmonary findings.   Original Report Authenticated By: Kennith Center, M.D.    Dg Hip Complete Left  03/17/2012  **ADDENDUM** CREATED: 03/17/2012 09:10:31  Speech recognition error on the initial dictation.  The impression should read "acute, comminuted and angulated intertrochanteric fracture of the left femur."  To stress, this patient does have an acute fracture.  **END ADDENDUM** SIGNED BY: Loraine Leriche E. Karin Golden, M.D.   03/17/2012  *RADIOLOGY REPORT*  Clinical Data: Fall.  Deformity.  LEFT  HIP - COMPLETE 2+ VIEW  Comparison: 06/05/2006  Findings: There is an acute comminuted and laterally angulated intertrochanteric  fracture of the femur.  Lesser trochanteric fragments are displaced 2 cm.  Femoral neck is intact.  No degenerative change of the hip joint.  IMPRESSION: No acute, comminuted and angulated intertrochanteric fracture of the left femur.   Original Report Authenticated By: Paulina Fusi, M.D.    Dg Hip Operative Left  03/18/2012  *RADIOLOGY REPORT*  Clinical Data: Left hip fracture.  OPERATIVE LEFT HIP  Comparison: 03/17/2012.  Findings: Three intraoperative views of the left hip submitted for review after surgery.  This reveals reduction of the left intertrochanteric fracture with dynamic compression type screw with distal fixation.  No complication noted.  Lesser trochanter remains as a separate fracture fragment.  IMPRESSION: Open reduction and internal fixation left intratrochanteric fracture.   Original Report Authenticated By: Lacy Duverney, M.D.    Ct Head Wo Contrast  03/17/2012  *RADIOLOGY REPORT*  Clinical Data: Fall with loss of consciousness and headache.  CT HEAD WITHOUT CONTRAST  Technique:  Contiguous axial images were obtained from the base of the skull through the vertex without contrast.  Comparison: 03/30/2011.  Findings: No evidence of acute infarct, acute hemorrhage, mass lesion, mass effect or hydrocephalus.  Minimal atrophy. No air fluid levels in the visualized portions of the paranasal sinuses and mastoid air cells.  IMPRESSION: No acute intracranial abnormality.   Original Report Authenticated By: Leanna Battles, M.D.    Dg Pelvis Portable  03/17/2012  *RADIOLOGY REPORT*  Clinical Data: Postoperative left hip.  PORTABLE PELVIS  Comparison: 03/01/2012  Findings: Left intramedullary nail placement noted traversing the intertrochanteric fracture.  Displaced lesser trochanteric fragment observed.  Distal interlocking screw of the new left intramedullary nail noted.   No new fracture or complicating feature.  Old right proximal femoral intramedullary nail noted.  IMPRESSION:  1.  Left hip ORIF with intramedullary nail traversing the intertrochanteric fracture, without complicating feature.   Original Report Authenticated By: Gaylyn Rong, M.D.     Assessment/Plan: Diagnosis: left IT hip fx s/p screw fixation.  1. Does the need for close, 24 hr/day medical supervision in concert with the patient's rehab needs make it unreasonable for this patient to be served in a less intensive setting? Potentially 2. Co-Morbidities requiring supervision/potential complications: chf, copd, ABLA 3. Due to bladder management, bowel management, safety, skin/wound care, disease management, medication administration, pain management and patient education, does the patient require 24 hr/day rehab nursing? Potentially 4. Does the patient require coordinated care of a physician, rehab nurse, PT (1-2 hrs/day, 5 days/week) and OT (1-2 hrs/day, 5 days/week) to address physical and functional deficits in the context of the above medical diagnosis(es)? Potentially Addressing deficits in the following areas: balance, endurance, locomotion, strength, transferring, bowel/bladder control, bathing, dressing, feeding, grooming and toileting 5. Can the patient actively participate in an intensive therapy program of at least 3 hrs of therapy per day at least 5 days per week? Yes 6. The potential for patient to make measurable gains while on inpatient rehab is good 7. Anticipated functional outcomes upon discharge from inpatient rehab are mod I to supervision with PT, mod I to supervision with OT, n/a with SLP. 8. Estimated rehab length of stay to reach the above functional goals is: 2 weeks 9. Does the patient have adequate social supports to accommodate these discharge functional goals? No 10. Anticipated D/C setting: Home 11. Anticipated post D/C treatments: HH therapy 12. Overall  Rehab/Functional Prognosis: excellent  RECOMMENDATIONS: This patient's condition is appropriate for continued rehabilitative care in the following setting: SNF Patient has agreed  to participate in recommended program. Yes Note that insurance prior authorization may be required for reimbursement for recommended care.  Comment: Pt has nobody to assist her once home. She states that all of her sons work. She would not be able to go home alone after a CIR stay.  Ranelle Oyster, MD, [place] [facility]     03/18/2012

## 2012-03-18 NOTE — Clinical Social Work Psychosocial (Signed)
    Clinical Social Work Department BRIEF PSYCHOSOCIAL ASSESSMENT 03/18/2012  Patient:  RAYCHELL, HOLCOMB     Account Number:  [id number]     Admit date:  03/17/2012  Clinical Social Worker:  Hattie Perch  Date/Time:  03/18/2012 12:00 M  Referred by:  Physician  Date Referred:  03/18/2012 Referred for  SNF Placement   Other Referral:   Interview type:  Patient Other interview type:   family    PSYCHOSOCIAL DATA Living Status:  ALONE Admitted from facility:   Level of care:   Primary support name:  keith Littlejohn Primary support relationship to patient:  CHILD, ADULT Degree of support available:   good    CURRENT CONCERNS Current Concerns  Post-Acute Placement   Other Concerns:    SOCIAL WORK ASSESSMENT / PLAN CSW met with patient and patient's granddaughter at bedside. patient is alert and oriented X3. originally, patient declined snf placement. however, CSW was called back and patient is now agreeable to snf placement at [facility].   Assessment/plan status:   Other assessment/ plan:   Information/referral to community resources:    PATIENT'S/FAMILY'S RESPONSE TO PLAN OF CARE: agreeable to snf at [facility].

## 2012-03-19 DIAGNOSIS — S72143A Displaced intertrochanteric fracture of unspecified femur, initial encounter for closed fracture: Secondary | ICD-10-CM

## 2012-03-19 DIAGNOSIS — W19XXXA Unspecified fall, initial encounter: Secondary | ICD-10-CM

## 2012-03-19 LAB — BASIC METABOLIC PANEL
CO2: 32 mEq/L (ref 19–32)
Chloride: 104 mEq/L (ref 96–112)
Creatinine, Ser: 0.53 mg/dL (ref 0.50–1.10)
GFR calc Af Amer: >90 mL/min (ref >90)
Potassium: 3.9 mEq/L (ref 3.5–5.1)

## 2012-03-19 LAB — HEMOGLOBIN AND HEMATOCRIT, BLOOD
HCT: 27.7 % — ABNORMAL LOW (ref 36.0–46.0)
Hemoglobin: 9.3 g/dL — ABNORMAL LOW (ref 12.0–15.0)

## 2012-03-19 LAB — CBC
HCT: 20.1 % — ABNORMAL LOW (ref 36.0–46.0)
Hemoglobin: 6.5 g/dL — CL (ref 12.0–15.0)
MCV: 95.3 fL (ref 78.0–100.0)
WBC: 6.5 10*3/uL (ref 4.0–10.5)

## 2012-03-19 NOTE — Progress Notes (Signed)
Speech Language Pathology Dysphagia Treatment Patient Details Name: Jean Mitchell MRN: [id number] DOB: 01-03-1928 Today's Date: 03/19/2012 Time: 4098-1191 SLP Time Calculation (min): 21 min  Assessment / Plan / Recommendation Clinical Impression  Pt seen to assess tolerance of po diet, review results of esophagram and compensatory strategies to mitigate esophageal dysmotility.  Pt stated her swallow function was better today than yesterday and she has nearly empty lunch plate on tray table.  She reports intentionally ordering extra gravy and sauce and continuing to consume liquids with meals.  SLP advised pt to general aspiration precautions given pt's COPD possibly impacting swallow/respiration coordination and normal "swallow mechanism" per esophagram.  Pt verbalized understanding to information provided and slp used "teach back"  to assure comprehension.  No further slp indicated as all eduation completed.      Diet Recommendation  Continue with Current Diet: Regular;Thin liquid    SLP Plan All goals met   Pertinent Vitals/Pain Afebrile, decreased, pt receiving blood today   Swallowing Goals  SLP Swallowing Goals Swallow Study Goal #2 - Progress: Met  General Temperature Spikes Noted: No Respiratory Status: Supplemental O2 delivered via (comment) Behavior/Cognition: Alert;Cooperative;Pleasant mood Oral Cavity - Dentition: Adequate natural dentition Patient Positioning: Upright in bed  Oral Cavity - Oral Hygiene   oral cavity clean  Dysphagia Treatment Treatment focused on: Skilled observation of diet tolerance;Other (comment) (education re: esophagram) Treatment Methods/Modalities: Skilled observation Patient observed directly with PO's: Yes Type of PO's observed: Regular;Thin liquids Feeding: Able to feed self Liquids provided via: Cup Type of cueing: Verbal (compensatory strategies to mitigate esophageal dysmotility) Amount of cueing: Minimal   GO     Jean Burnet, MS  [facility] SLP [phone]

## 2012-03-19 NOTE — Progress Notes (Signed)
CRITICAL VALUE ALERT  Critical value received:  Hgb 6.5  Date of notification:  03/19/12  Time of notification:  05:15  Critical value read back:yes  Nurse who received alert:  Christell Faith, RN  MD notified (1st page):  Craige Cotta  Time of first page:  05:16  MD notified (2nd page): Craige Cotta  Time of second page: 05:55  Responding MD: Craige Cotta  Time MD responded:  06:04   - MD responded with order to transfuse 2 unites RBCs

## 2012-03-19 NOTE — Progress Notes (Signed)
[facility] PROGRESS NOTE  Jean Mitchell UJW:[id number] DOB: 05/25/1927 DOA: 03/17/2012 PCP: [name], MD  Assessment/Plan: Left hip fracture: is post open reduction internal fixation by Dr. Charlann Boxer on 03/17/2012, patient tolerated the procedure well, continue on Lovenox, commence PT OT , SNF pending  Fall with LOC, no neck tenderness on exam. Pt has history of heart disease so consider arrythmia, but appears hypovolemic, has weakness of the right leg chronically, pain in the left ankle acutely, is supposed to use a walker but does not, and has been taking benzodiazepines and narcotics, all of which likely contributed to her fall today. CT of the head is stable, troponin is negative, EKG is unremarkable and she is stable on telemetry so far, fall is due to excessive use of sedative medications which should be titrated down, PT OT and placement tomm in SNF if Hgb stable, gentle down titration of her sedating medications.    Chronic diastolic CHF with EF preserved on echogram in 2011 EF was 50%- Monitor for hypervolemia , IV fluids for now monitor clinically.   HTN, on no medications with stable blood pressure   HLD, stable.  home dose statin   COPD, with no wheezing on exam  - Duonebs  - Continue ICS/LABA  - Incentive spirometry   Iron deficiency anemia, currently mildly decreased hgb  - Occult stool  - Restart iron   Weight loss:  - Nutrition consultation   Anxiety:  - Wean benzodiazepines as tolerated   Patient at risk for hospital delirium  - Frequent reorientation  - Lights on during day and off at night.    Code Status: Full  Family Communication: Discussed with the patient  Disposition Plan: SNF on 2/5  Procedures open reduction internal fixation of left femur by Dr. Charlann Boxer on 03/17/2012  Consults Ortho  DVT Prophylaxis Lovenox   HPI/Subjective: No new c/o   Objective: Filed Vitals:   03/19/12 0915 03/19/12 0924 03/19/12 0949 03/19/12 1015  BP: 94/50  91/43  104/51  Pulse: 84  80 85  Temp: 98.7 F (37.1 C)  98.7 F (37.1 C) 98 F (36.7 C)  TempSrc: Oral  Oral Oral  Resp: 18  18 16   Height:      Weight:      SpO2:  95% 94%     Intake/Output Summary (Last 24 hours) at 03/19/12 1211 Last data filed at 03/19/12 1000  Gross per 24 hour  Intake 876.25 ml  Output   2825 ml  Net -1948.75 ml   Filed Weights   03/17/12 1300 03/19/12 0411  Weight: 54 kg (119 lb 0.8 oz) 55.8 kg (123 lb 0.3 oz)    Exam:   General:  A+Ox3, NAD  Cardiovascular: rrr  Respiratory: clear  Abdomen: +BS, soft  Data Reviewed: Basic Metabolic Panel:  Lab 03/19/12 5621 03/18/12 0454 03/17/12 0836  NA 139 137 140  K 3.9 4.0 4.3  CL 104 102 103  CO2 32 30 --  GLUCOSE 97 157* 97  BUN 12 14 12   CREATININE 0.53 0.55 0.90  CALCIUM 8.1* 8.4 --  MG -- -- --  PHOS -- -- --   Liver Function Tests: No results found for this basename: AST:5,ALT:5,ALKPHOS:5,BILITOT:5,PROT:5,ALBUMIN:5 in the last 168 hours No results found for this basename: LIPASE:5,AMYLASE:5 in the last 168 hours No results found for this basename: AMMONIA:5 in the last 168 hours CBC:  Lab 03/19/12 0433 03/18/12 0454 03/17/12 1100 03/17/12 0836  WBC 6.5 6.3 9.6 --  NEUTROABS -- -- -- --  HGB 6.5* 9.3* 10.3* 10.5*  HCT 20.1* 28.3* 31.7* 31.0*  MCV 95.3 96.6 96.9 --  PLT 172 214 242 --   Cardiac Enzymes: No results found for this basename: CKTOTAL:5,CKMB:5,CKMBINDEX:5,TROPONINI:5 in the last 168 hours BNP (last 3 results) No results found for this basename: PROBNP:3 in the last 8760 hours CBG: No results found for this basename: GLUCAP:5 in the last 168 hours  Recent Results (from the past 240 hour(s))  SURGICAL PCR SCREEN     Status: Normal   Collection Time   03/17/12  3:01 PM      Component Value Range Status Comment   MRSA, PCR NEGATIVE  NEGATIVE Final    Staphylococcus aureus NEGATIVE  NEGATIVE Final   URINE CULTURE     Status: Normal   Collection Time   03/17/12  3:23 PM       Component Value Range Status Comment   Specimen Description URINE, CATHETERIZED   Final    Special Requests none   Final    Culture  Setup Time 03/17/2012 22:15   Final    Colony Count NO GROWTH   Final    Culture NO GROWTH   Final    Report Status 03/18/2012 FINAL   Final      Studies: Dg Hip Operative Left  03/18/2012  *RADIOLOGY REPORT*  Clinical Data: Left hip fracture.  OPERATIVE LEFT HIP  Comparison: 03/17/2012.  Findings: Three intraoperative views of the left hip submitted for review after surgery.  This reveals reduction of the left intertrochanteric fracture with dynamic compression type screw with distal fixation.  No complication noted.  Lesser trochanter remains as a separate fracture fragment.  IMPRESSION: Open reduction and internal fixation left intratrochanteric fracture.   Original Report Authenticated By: Lacy Duverney, M.D.    Dg Esophagus  03/18/2012  *RADIOLOGY REPORT*  Clinical Data: Dysphagia, history breast carcinoma and recent left hip replacement  ESOPHOGRAM/BARIUM SWALLOW  Technique:  Single contrast examination was performed using thin barium.  Fluoroscopy time:  2.1 minutes.  Comparison:  None.  Findings:  This study is somewhat compromised in view of the limited positions in which the patient could be placed.  In the right lateral position rapid sequence spot films of the cervical esophagus were performed.  The swallowing mechanism is unremarkable.  There is some prominence of the cricopharyngeus muscle noted.  Moderate tertiary contractions are noted throughout the mid and distal esophagus.  No hiatal hernia is seen.  Reflux could not be evaluated.  A barium pill was given at the end of the study which remained within the distal esophagus.  However the lumen of the distal esophagus appears patent with no definite stricture.  Most likely, the pill is delayed passage as a result of the position in which the patient had to be placed as well as the tertiary contractions.   IMPRESSION:  1.  Moderate tertiary contractions in the mid and distal esophagus. 2.  Prominent cricopharyngeus muscle. 3.  Barium pill remains in the distal esophagus most likely due to the position in which the patient had to be placed, as well as tertiary contractions.  No definite stricture is evident. 4.  This study is compromised by the limited positions in which the patient could be placed.   Original Report Authenticated By: Dwyane Dee, M.D.    Dg Pelvis Portable  03/17/2012  *RADIOLOGY REPORT*  Clinical Data: Postoperative left hip.  PORTABLE PELVIS  Comparison: 03/01/2012  Findings: Left intramedullary nail placement noted traversing the intertrochanteric  fracture.  Displaced lesser trochanteric fragment observed.  Distal interlocking screw of the new left intramedullary nail noted.  No new fracture or complicating feature.  Old right proximal femoral intramedullary nail noted.  IMPRESSION:  1.  Left hip ORIF with intramedullary nail traversing the intertrochanteric fracture, without complicating feature.   Original Report Authenticated By: Gaylyn Rong, M.D.     Scheduled Meds:   . ipratropium  0.5 mg Nebulization TID   And  . albuterol  2.5 mg Nebulization TID  . budesonide (PULMICORT) nebulizer solution  0.5 mg Nebulization BID  . clonazePAM  0.5 mg Oral BID  . docusate sodium  100 mg Oral BID  . enoxaparin (LOVENOX) injection  40 mg Subcutaneous Q24H  . ferrous sulfate  325 mg Oral TID PC  . fluticasone  2 spray Each Nare Daily  . senna  1 tablet Oral BID  . simvastatin  10 mg Oral q1800   Continuous Infusions:   Principal Problem:  *Intertrochanteric fracture of left femur Active Problems:  HYPERLIPIDEMIA  ANEMIA, IRON DEFICIENCY  HYPERTENSION, BENIGN  CHF  COPD  BACK PAIN, LUMBAR, CHRONIC  OSTEOPOROSIS NOS  WEIGHT LOSS  Anxiety  Recurrent falls  Loss of consciousness    Time spent: 25    [facility], Shereda Graw  [facility] Pager [phone]. If 8PM-8AM,  please contact night-coverage at [email or web address], password [facility] 03/19/2012, 12:11 PM  LOS: 2 days

## 2012-03-19 NOTE — Progress Notes (Signed)
Physical Therapy Treatment Patient Details Name: Jean Mitchell MRN: [id number] DOB: 01-30-1928 Today's Date: 03/19/2012 Time: 4098-1191 PT Time Calculation (min): 16 min  PT Assessment / Plan / Recommendation Comments on Treatment Session  Pt able to tolerate ambulating short distance today however fatigues quickly.  SaO2 read 85% upon return to room on room air so reapplied 2L O2 [place].    Follow Up Recommendations  SNF;Supervision/Assistance - 24 hour     Does the patient have the potential to tolerate intense rehabilitation     Barriers to Discharge        Equipment Recommendations  Wheelchair (measurements PT)    Recommendations for Other Services    Frequency     Plan Discharge plan remains appropriate;Frequency remains appropriate    Precautions / Restrictions Precautions Precautions: Fall Restrictions Weight Bearing Restrictions: Yes LLE Weight Bearing: Partial weight bearing LLE Partial Weight Bearing Percentage or Pounds: 50%   Pertinent Vitals/Pain Pt reports pain mild to moderate however aware to call out for meds after tx if desired. Pt left with nsg tech as well.    Mobility  Bed Mobility Bed Mobility: Supine to Sit Supine to Sit: 4: Min assist;HOB elevated Details for Bed Mobility Assistance: assist for L LE, verbal cues for technique, slight assist for scoot to EOB Transfers Transfers: Stand to Sit;Sit to Stand Sit to Stand: 3: Mod assist;With upper extremity assist;From bed Stand to Sit: To chair/3-in-1;With upper extremity assist;3: Mod assist Details for Transfer Assistance: +2 for safety, verbal cues for PWB, using UEs through RW, hand placement Ambulation/Gait Ambulation/Gait Assistance: 3: Mod assist (pt felt unable to take steps today) Ambulation Distance (Feet): 20 Feet Assistive device: Rolling walker Ambulation/Gait Assistance Details: +2 for safety, pt with initial strong posterior lean requiring mod assist, verbal cues for RW placement, step  length, sequence Gait Pattern: Step-to pattern;Decreased stride length;Narrow base of support;Antalgic Gait velocity: decreased    Exercises     PT Diagnosis:    PT Problem List:   PT Treatment Interventions:     PT Goals Acute Rehab PT Goals PT Goal: Supine/Side to Sit - Progress: Progressing toward goal PT Goal: Sit to Stand - Progress: Progressing toward goal PT Goal: Stand to Sit - Progress: Progressing toward goal PT Goal: Ambulate - Progress: Progressing toward goal  Visit Information  Last PT Received On: 03/19/12 Assistance Needed: +2    Subjective Data  Subjective: I'll try whatever. (ambulate)   Cognition  Cognition Overall Cognitive Status: Appears within functional limits for tasks assessed/performed Arousal/Alertness: Awake/alert Orientation Level: Appears intact for tasks assessed Behavior During Session: [facility] for tasks performed    Balance     End of Session PT - End of Session Activity Tolerance: Patient limited by fatigue Patient left: in chair;with call bell/phone within reach;with nursing in room (with nsg tech to give bath)   GP     Tanaya Dunigan,KATHrine E 03/19/2012, 4:50 PM  Zenovia Jarred, PT, DPT 03/19/2012 Pager: [phone]

## 2012-03-19 NOTE — Progress Notes (Addendum)
Pt has limited assist at home, so SNF rehab is recommended by Dr. Riley Kill.  We do admit pts to CIR and then d/c to SNF when appropriate to decrease specific goals related to  burden of care, but beds are very limited on inpt rehab this week and I can not justify this admission. Discussed with RNCM. [phone]

## 2012-03-20 DIAGNOSIS — S72143A Displaced intertrochanteric fracture of unspecified femur, initial encounter for closed fracture: Principal | ICD-10-CM

## 2012-03-20 DIAGNOSIS — D509 Iron deficiency anemia, unspecified: Secondary | ICD-10-CM

## 2012-03-20 DIAGNOSIS — G8929 Other chronic pain: Secondary | ICD-10-CM

## 2012-03-20 LAB — TYPE AND SCREEN: Unit division: 0

## 2012-03-20 LAB — BASIC METABOLIC PANEL
BUN: 9 mg/dL (ref 6–23)
Calcium: 8.6 mg/dL (ref 8.4–10.5)
GFR calc non Af Amer: 87 mL/min — ABNORMAL LOW (ref >90)
Glucose, Bld: 95 mg/dL (ref 70–99)
Sodium: 139 mEq/L (ref 135–145)

## 2012-03-20 LAB — VITAMIN D 1,25 DIHYDROXY: Vitamin D2 1, 25 (OH)2: <8 pg/mL

## 2012-03-20 LAB — CBC
MCH: 31.4 pg (ref 26.0–34.0)
MCHC: 33.6 g/dL (ref 30.0–36.0)
Platelets: 172 10*3/uL (ref 150–400)

## 2012-03-20 MED ORDER — CLONAZEPAM 0.5 MG PO TABS: 0.5000 mg | ORAL_TABLET | Freq: Two times a day (BID) | ORAL | Status: DC

## 2012-03-20 MED ORDER — FERROUS SULFATE 325 (65 FE) MG PO TABS: 325.0000 mg | ORAL_TABLET | Freq: Three times a day (TID) | ORAL | Status: DC

## 2012-03-20 MED ORDER — HYDROCODONE-ACETAMINOPHEN 5-325 MG PO TABS
1.0000 | ORAL_TABLET | ORAL | Status: DC | PRN
  Administered 2012-03-20 (×2): 1 via ORAL
  Filled 2012-03-20: qty 2

## 2012-03-20 MED ORDER — ALBUTEROL SULFATE (5 MG/ML) 0.5% IN NEBU: 2.5000 mg | INHALATION_SOLUTION | Freq: Three times a day (TID) | RESPIRATORY_TRACT | Status: DC

## 2012-03-20 MED ORDER — HYDROCODONE-ACETAMINOPHEN 5-325 MG PO TABS: 1.0000 | ORAL_TABLET | ORAL | Status: DC | PRN

## 2012-03-20 MED ORDER — DSS 100 MG PO CAPS: 100.0000 mg | ORAL_CAPSULE | Freq: Two times a day (BID) | ORAL | Status: DC

## 2012-03-20 MED ORDER — IPRATROPIUM BROMIDE 0.02 % IN SOLN: 0.5000 mg | Freq: Three times a day (TID) | RESPIRATORY_TRACT | Status: DC

## 2012-03-20 MED ORDER — ENOXAPARIN SODIUM 40 MG/0.4ML ~~LOC~~ SOLN: 40.0000 mg | SUBCUTANEOUS | Status: DC

## 2012-03-20 NOTE — Progress Notes (Signed)
Clinical Social Work Department CLINICAL SOCIAL WORK PLACEMENT NOTE 03/20/2012  Patient:  Jean Mitchell, Jean Mitchell  Account Number:  [id number] Admit date:  03/17/2012  Clinical Social Worker:  Becky Sax, LCSW  Date/time:  03/20/2012 12:00 M  Clinical Social Work is seeking post-discharge placement for this patient at the following level of care:   SKILLED NURSING   (*CSW will update this form in Epic as items are completed)   03/20/2012  Patient/family provided with [facility] Department of Clinical Social Work's list of facilities offering this level of care within the geographic area requested by the patient (or if unable, by the patient's family).  03/20/2012  Patient/family informed of their freedom to choose among providers that offer the needed level of care, that participate in Medicare, Medicaid or managed care program needed by the patient, have an available bed and are willing to accept the patient.  03/20/2012  Patient/family informed of [facility]' ownership interest in [facility], as well as of the fact that they are under no obligation to receive care at this facility.  PASARR submitted to EDS on 03/20/2012 PASARR number received from EDS on 03/20/2012  FL2 transmitted to all facilities in geographic area requested by pt/family on  03/20/2012 FL2 transmitted to all facilities within larger geographic area on 03/20/2012  Patient informed that his/her managed care company has contracts with or will negotiate with  certain facilities, including the following:     Patient/family informed of bed offers received:  03/20/2012 Patient chooses bed at [facility] Physician recommends and patient chooses bed at  [facility]  Patient to be transferred to [facility] on  03/20/2012 Patient to be transferred to facility by ptar  The following physician request were entered in Epic:   Additional Comments:

## 2012-03-20 NOTE — Progress Notes (Signed)
Patient cleared for discharge. Paperwork completed for [facility]. Packet copied and placed in [place]. ptar called for transportation.  Lugenia Assefa C. India Jolin MSW, LCSW [phone]

## 2012-03-20 NOTE — Progress Notes (Addendum)
   Subjective: 3 Days Post-Op Procedure(s) (LRB): INTRAMEDULLARY (IM) NAIL FEMORAL (Left)   Patient reports pain as mild, pain well controlled. No events throughout the night.   Objective:   VITALS:   Filed Vitals:   03/20/12 0452  BP: 100/50  Pulse: 87  Temp: 98.2 F (36.8 C)  Resp: 16    Neurovascular intact Dorsiflexion/Plantar flexion intact Incision: dressing C/D/I No cellulitis present Compartment soft  LABS  Basename 03/20/12 0454 03/19/12 1619 03/19/12 0433 03/18/12 0454  HGB 8.9* 9.3* 6.5* --  HCT 26.5* 27.7* 20.1* --  WBC 6.1 -- 6.5 6.3  PLT 172 -- 172 214     Basename 03/20/12 0454 03/19/12 0433 03/18/12 0454  NA 139 139 137  K 4.4 3.9 4.0  BUN 9 12 14   CREATININE 0.52 0.53 0.55  GLUCOSE 95 97 157*     Assessment/Plan: 3 Days Post-Op Procedure(s) (LRB): INTRAMEDULLARY (IM) NAIL FEMORAL (Left) Up with therapy Orthopaedically stable Meprilex dressing to be changed prior to discharge, then changed daily with 4x4 guaze and tape. 50% weight bearing on the left leg. Lovenox for 2 weeks, Rx written Norco for pain, Rx written Follow up in 2 weeks at [facility]. Follow up with OLIN,Kelcee Bjorn D in 2 weeks.  Contact information:  [facility] [address] [place] [address] [phone]           Jean Mitchell. Jean Mitchell   PAC  03/20/2012, 9:24 AM

## 2012-03-20 NOTE — Progress Notes (Addendum)
   Subjective: 2 Days Post-Op Procedure(s) (LRB): INTRAMEDULLARY (IM) NAIL FEMORAL (Left)   Patient reports pain as mild, pain well controlled. Little weakness in trying to lift her left leg, but otherwise feels good. Blood levels a little low and receiving blood this morning.  Objective:   VITALS:   Filed Vitals:   03/20/12 0651  BP: 88/46  Pulse: 88  Temp: 98.5 F (36.9 C)  Resp: 18    Neurovascular intact Dorsiflexion/Plantar flexion intact Incision: dressing C/D/I No cellulitis present Compartment soft  LABS  Basename 03/19/12 0433 03/18/12 0454  HGB 6.5* --  HCT 20.1* --  WBC 6.5 6.3  PLT 172 214     Basename 03/19/12 0433 03/18/12 0454  NA 139 137  K 3.9 4.0  BUN 12 14  CREATININE 0.53 0.55  GLUCOSE 97 157*     Assessment/Plan: 2 Days Post-Op Procedure(s) (LRB): INTRAMEDULLARY (IM) NAIL FEMORAL (Left)  Up with therapy Receiving 2 units of blood for symptomatic ABLA Plan for D/C is eventually to CIR / SNF.    Anastasio Auerbach Param Capri   PAC  03/20/2012, 9:20 AM

## 2012-03-20 NOTE — Discharge Summary (Signed)
Physician Discharge Summary  Jean Mitchell:[id number] DOB: Sep 06, 1927 DOA: 03/17/2012  PCP: [name], MD  Admit date: 03/17/2012 Discharge date: 03/20/2012  Time spent: 35 minutes  Recommendations for Outpatient Follow-up:  1. Home O2 24/7 2L 2. CBC 1 week  Discharge Diagnoses:  Principal Problem:  *Intertrochanteric fracture of left femur Active Problems:  HYPERLIPIDEMIA  ANEMIA, IRON DEFICIENCY  HYPERTENSION, BENIGN  CHF  COPD  BACK PAIN, LUMBAR, CHRONIC  OSTEOPOROSIS NOS  WEIGHT LOSS  Anxiety  Recurrent falls  Loss of consciousness   Discharge Condition: improved  Diet recommendation: cardiac  Filed Weights   03/17/12 1300 03/19/12 0411 03/20/12 0500  Weight: 54 kg (119 lb 0.8 oz) 55.8 kg (123 lb 0.3 oz) 57.2 kg (126 lb 1.7 oz)    History of present illness:  The patient is a 79 y.o. year-old female with history of CAD, CHF, COPD, HTN, breast cancer s/p XRT on tamoxifen, and recurrent falls who presents with a fall and left hip fracture. At baseline, she ambulates with some difficulty due to weakness of the right leg after a previous hip fracture in 2011. She is supposed to ambulate with a cane or walker, but does not. She frequently gets her feet caught up/near falls at home per family and has tripped over some rugs in the house. A few weeks ago, she reportedly had a fall that resulted in several rib fractures on the right (not visualized on CXR from 1/31 or today), and she has been given two prescriptions for vicodin which she has been taking. She is also taking several benzodiazepine medications. Today, she was at home alone when she was walking to the bathroom. She told the ER physician that she was walking, then she woke up on the floor. She tells me that she remembers falling, but then lost consciousness when she was on the floor. She woke up and was able to drag herself to the phone 2 hours later with 10/10 pain of the left hip. Lives alone, but her family visits  every day.  In the ER, Labs demonstrated mildly elevated creatinine from baseline of 0.9. hgb 10.5 (slightly down from baseline of mid-11 range). CXR demonstrated stable cardiomegaly. XR hip demonstrated acute comminuted and angulated intertrochanteric fracture of the left femur. Dr. Charlann Boxer was contacted by the ER staff regarding findings and he has requested she be kept NPO for possible surgery this afternoon. Hospitalist called to admit to medicine for ongoing care.    Hospital Course:  Left hip fracture: is post open reduction internal fixation by Dr. Charlann Boxer on 03/17/2012, patient tolerated the procedure well, continue on Lovenox, commence PT OT , SNF pending  Fall with LOC, no neck tenderness on exam. Pt has history of heart disease so consider arrythmia, but appears hypovolemic, has weakness of the right leg chronically, pain in the left ankle acutely, is supposed to use a walker but does not, and has been taking benzodiazepines and narcotics, all of which likely contributed to her fall today. CT of the head is stable, troponin is negative, EKG is unremarkable, fall is due to excessive use of sedative medications which should be titrated down, PT OT and placement tomm in SNF if Hgb stable, gentle down titration of her sedating medications.   Chronic diastolic CHF with EF preserved on echogram in 2011 EF was 50%- Monitor for hypervolemia , IV fluids for now monitor clinically.   HTN  HLD, stable. home dose statin   COPD, with no wheezing on exam  -  Duonebs  - Continue ICS/LABA  - Incentive spirometry   Iron deficiency anemia, currently mildly decreased hgb  - Restart iron  ABLA- s/p 2 units PRBCs  Anxiety:  - Wean benzodiazepines as tolerated   Patient at risk for hospital delirium  - Frequent reorientation  - Lights on during day and off at night.    Procedures:  ORIF  Consultations:  ortho  Discharge Exam: Filed Vitals:   03/20/12 0452 03/20/12 0500 03/20/12 0800 03/20/12  0811  BP: 100/50     Pulse: 87     Temp: 98.2 F (36.8 C)     TempSrc: Oral     Resp: 16  18   Height:      Weight:  57.2 kg (126 lb 1.7 oz)    SpO2: 98%  98% 92%    General: pleasant/cooperative Cardiovascular: rrr Respiratory: clear anterior  Discharge Instructions      Discharge Orders    Future Appointments: Provider: Department: Dept Phone: Center:   05/28/2012 8:00 AM Corwin Levins, MD [facility] -[facility] [phone] [facility]     Future Orders Please Complete By Expires   Diet - low sodium heart healthy      Diet - low sodium heart healthy      Call MD / Call 911      Comments:   If you experience chest pain or shortness of breath, CALL 911 and be transported to the hospital emergency room.  If you develope a fever above 101 F, pus (white drainage) or increased drainage or redness at the wound, or calf pain, call your surgeon's office.   Constipation Prevention      Comments:   Drink plenty of fluids.  Prune juice may be helpful.  You may use a stool softener, such as Colace (over the counter) 100 mg twice a day.  Use MiraLax (over the counter) for constipation as needed.   Partial weight bearing      Comments:   50% left leg   Discharge instructions      Comments:   Daily dressing changes with 4x4 gauze and tape. Keep the area dry and clean until follow up. Follow up in 2 weeks at [facility]. Call with any questions or concerns.   Discharge instructions      Comments:   Wean off anxiety medications as fall most likely caused by overmedications   Discharge instructions      Comments:   2L  O2 24/7       Medication List     As of 03/20/2012 11:14 AM    STOP taking these medications         ALPRAZolam 0.5 MG tablet   Commonly known as: XANAX      chlordiazePOXIDE 5 MG capsule   Commonly known as: LIBRIUM      HYDROcodone-acetaminophen 10-325 MG per tablet   Commonly known as: NORCO      traMADol 50 MG tablet   Commonly  known as: ULTRAM      TAKE these medications         albuterol (5 MG/ML) 0.5% nebulizer solution   Commonly known as: PROVENTIL   Take 0.5 mLs (2.5 mg total) by nebulization 3 (three) times daily.      albuterol 108 (90 BASE) MCG/ACT inhaler   Commonly known as: PROVENTIL HFA;VENTOLIN HFA   Inhale 2 puffs into the lungs every 6 (six) hours as needed. For shortness of breath.  clonazePAM 0.5 MG tablet   Commonly known as: KLONOPIN   Take 1 tablet (0.5 mg total) by mouth 2 (two) times daily.      DSS 100 MG Caps   Take 100 mg by mouth 2 (two) times daily.      enoxaparin 40 MG/0.4ML injection   Commonly known as: LOVENOX   Inject 0.4 mLs (40 mg total) into the skin daily.      ferrous sulfate 325 (65 FE) MG tablet   Take 1 tablet (325 mg total) by mouth 3 (three) times daily after meals.      fluticasone 50 MCG/ACT nasal spray   Commonly known as: FLONASE   Place 2 sprays into the nose daily.      Fluticasone-Salmeterol 100-50 MCG/DOSE Aepb   Commonly known as: ADVAIR   Inhale 1 puff into the lungs every 12 (twelve) hours.      HYDROcodone-acetaminophen 5-325 MG per tablet   Commonly known as: NORCO/VICODIN   Take 1-2 tablets by mouth every 4 (four) hours as needed for pain.      ipratropium 0.02 % nebulizer solution   Commonly known as: ATROVENT   Take 2.5 mLs (0.5 mg total) by nebulization 3 (three) times daily.      pravastatin 40 MG tablet   Commonly known as: PRAVACHOL   Take 1 tablet (40 mg total) by mouth daily.        Follow-up Information    Follow up with Shelda Pal, MD. Schedule an appointment as soon as possible for a visit in 2 weeks.   Contact information:   [address] [place] [address] [phone]           The results of significant diagnostics from this hospitalization (including imaging, microbiology, ancillary and laboratory) are listed below for reference.    Significant Diagnostic Studies: Dg Chest 1  View  03/17/2012  *RADIOLOGY REPORT*  Clinical Data: CHF.  COPD.  Fall.  CHEST - 1 VIEW  Comparison: 03/14/2012.  Findings: Slight rightward rotation noted.  The cardiopericardial silhouette is enlarged. Hyperexpansion is consistent with emphysema. Interstitial markings are diffusely coarsened with chronic features.  No edema or focal airspace consolidation. Bones are diffusely demineralized.  IMPRESSION: Stable.  Cardiomegaly with emphysema.  No acute cardiopulmonary findings.   Original Report Authenticated By: Kennith Center, M.D.    Dg Chest 2 View  03/14/2012  *RADIOLOGY REPORT*  Clinical Data: COPD.  Fall 5 days ago.  CHEST - 2 VIEW  Comparison: 03/30/2011.  Findings: Advanced changes of COPD with hyperexpansion and flattening of hemidiaphragms.  The pulmonary arteries are also prominent centrally.  No contusions, effusions or pneumothoraces. Bullous changes at the apices.  Chronic reticular changes at the bases.  Focal nodular density along the left heart border new from the prior study.  The heart, mediastinal hilar contours are normal. The bony thorax shows no acute changes.  Advanced degenerative changes in the shoulders. Previous vertebroplasty or kyphoplasty on several thoracic and lumbar vertebrae.  No evidence of new compression fractures.  IMPRESSION: No evidence of acute trauma.  Advanced COPD.  No acute changes. Faint, nodular density along the left heart border.  This could be a nipple shadow.  Nonetheless, follow-up radiographs with nipple markers or chest CT recommended.   Original Report Authenticated By: Sander Radon, M.D.    Dg Ribs Unilateral Right  03/14/2012  *RADIOLOGY REPORT*  Clinical Data: Right anterior rib/chest pain.  Fall 5 days ago.  RIGHT RIBS - 2  VIEW  Comparison: 03/15/2011 chest radiograph.  Findings: No pneumothorax.  Emphysema.  No displaced rib fractures are identified.  Postprocedural changes of multilevel vertebral augmentation.  IMPRESSION: No displaced rib  fractures or pneumothorax.   Original Report Authenticated By: Andreas Newport, M.D.    Dg Hip Complete Left  03/17/2012  **ADDENDUM** CREATED: 03/17/2012 09:10:31  Speech recognition error on the initial dictation.  The impression should read "acute, comminuted and angulated intertrochanteric fracture of the left femur."  To stress, this patient does have an acute fracture.  **END ADDENDUM** SIGNED BY: Loraine Leriche E. Karin Golden, M.D.   03/17/2012  *RADIOLOGY REPORT*  Clinical Data: Fall.  Deformity.  LEFT HIP - COMPLETE 2+ VIEW  Comparison: 06/05/2006  Findings: There is an acute comminuted and laterally angulated intertrochanteric fracture of the femur.  Lesser trochanteric fragments are displaced 2 cm.  Femoral neck is intact.  No degenerative change of the hip joint.  IMPRESSION: No acute, comminuted and angulated intertrochanteric fracture of the left femur.   Original Report Authenticated By: Paulina Fusi, M.D.    Dg Hip Operative Left  03/18/2012  *RADIOLOGY REPORT*  Clinical Data: Left hip fracture.  OPERATIVE LEFT HIP  Comparison: 03/17/2012.  Findings: Three intraoperative views of the left hip submitted for review after surgery.  This reveals reduction of the left intertrochanteric fracture with dynamic compression type screw with distal fixation.  No complication noted.  Lesser trochanter remains as a separate fracture fragment.  IMPRESSION: Open reduction and internal fixation left intratrochanteric fracture.   Original Report Authenticated By: Lacy Duverney, M.D.    Dg Abd 1 View  03/01/2012  *RADIOLOGY REPORT*  Clinical Data: Abdominal pain, constipation, rectal bleeding  ABDOMEN - 1 VIEW  Comparison: None.  Findings:  Large colonic stool burden, with likely fecal impaction within the rectum and distal sigmoid colon.  No definite evidence of obstruction.  No supine evidence of pneumoperitoneum.  No definite pneumatosis or portal venous gas.  Limited visualization of the lower thorax is normal.  Multilevel  upper lumbar spine vertebroplasty/kyphoplasty.  Post ORIF of the right femur and right femoral neck.  IMPRESSION: Large colonic stool burden with likely distal fecal impaction.   Original Report Authenticated By: Tacey Ruiz, MD    Ct Head Wo Contrast  03/17/2012  *RADIOLOGY REPORT*  Clinical Data: Fall with loss of consciousness and headache.  CT HEAD WITHOUT CONTRAST  Technique:  Contiguous axial images were obtained from the base of the skull through the vertex without contrast.  Comparison: 03/30/2011.  Findings: No evidence of acute infarct, acute hemorrhage, mass lesion, mass effect or hydrocephalus.  Minimal atrophy. No air fluid levels in the visualized portions of the paranasal sinuses and mastoid air cells.  IMPRESSION: No acute intracranial abnormality.   Original Report Authenticated By: Leanna Battles, M.D.    Dg Esophagus  03/18/2012  *RADIOLOGY REPORT*  Clinical Data: Dysphagia, history breast carcinoma and recent left hip replacement  ESOPHOGRAM/BARIUM SWALLOW  Technique:  Single contrast examination was performed using thin barium.  Fluoroscopy time:  2.1 minutes.  Comparison:  None.  Findings:  This study is somewhat compromised in view of the limited positions in which the patient could be placed.  In the right lateral position rapid sequence spot films of the cervical esophagus were performed.  The swallowing mechanism is unremarkable.  There is some prominence of the cricopharyngeus muscle noted.  Moderate tertiary contractions are noted throughout the mid and distal esophagus.  No hiatal hernia is seen.  Reflux could  not be evaluated.  A barium pill was given at the end of the study which remained within the distal esophagus.  However the lumen of the distal esophagus appears patent with no definite stricture.  Most likely, the pill is delayed passage as a result of the position in which the patient had to be placed as well as the tertiary contractions.  IMPRESSION:  1.  Moderate tertiary  contractions in the mid and distal esophagus. 2.  Prominent cricopharyngeus muscle. 3.  Barium pill remains in the distal esophagus most likely due to the position in which the patient had to be placed, as well as tertiary contractions.  No definite stricture is evident. 4.  This study is compromised by the limited positions in which the patient could be placed.   Original Report Authenticated By: Dwyane Dee, M.D.    Dg Pelvis Portable  03/17/2012  *RADIOLOGY REPORT*  Clinical Data: Postoperative left hip.  PORTABLE PELVIS  Comparison: 03/01/2012  Findings: Left intramedullary nail placement noted traversing the intertrochanteric fracture.  Displaced lesser trochanteric fragment observed.  Distal interlocking screw of the new left intramedullary nail noted.  No new fracture or complicating feature.  Old right proximal femoral intramedullary nail noted.  IMPRESSION:  1.  Left hip ORIF with intramedullary nail traversing the intertrochanteric fracture, without complicating feature.   Original Report Authenticated By: Gaylyn Rong, M.D.     Microbiology: Recent Results (from the past 240 hour(s))  SURGICAL PCR SCREEN     Status: Normal   Collection Time   03/17/12  3:01 PM      Component Value Range Status Comment   MRSA, PCR NEGATIVE  NEGATIVE Final    Staphylococcus aureus NEGATIVE  NEGATIVE Final   URINE CULTURE     Status: Normal   Collection Time   03/17/12  3:23 PM      Component Value Range Status Comment   Specimen Description URINE, CATHETERIZED   Final    Special Requests none   Final    Culture  Setup Time 03/17/2012 22:15   Final    Colony Count NO GROWTH   Final    Culture NO GROWTH   Final    Report Status 03/18/2012 FINAL   Final      Labs: Basic Metabolic Panel:  Lab 03/20/12 7829 03/19/12 0433 03/18/12 0454 03/17/12 0836  NA 139 139 137 140  K 4.4 3.9 4.0 4.3  CL 102 104 102 103  CO2 32 32 30 --  GLUCOSE 95 97 157* 97  BUN 9 12 14 12   CREATININE 0.52 0.53 0.55 0.90   CALCIUM 8.6 8.1* 8.4 --  MG -- -- -- --  PHOS -- -- -- --   Liver Function Tests: No results found for this basename: AST:5,ALT:5,ALKPHOS:5,BILITOT:5,PROT:5,ALBUMIN:5 in the last 168 hours No results found for this basename: LIPASE:5,AMYLASE:5 in the last 168 hours No results found for this basename: AMMONIA:5 in the last 168 hours CBC:  Lab 03/20/12 0454 03/19/12 1619 03/19/12 0433 03/18/12 0454 03/17/12 1100  WBC 6.1 -- 6.5 6.3 9.6  NEUTROABS -- -- -- -- --  HGB 8.9* 9.3* 6.5* 9.3* 10.3*  HCT 26.5* 27.7* 20.1* 28.3* 31.7*  MCV 93.6 -- 95.3 96.6 96.9  PLT 172 -- 172 214 242   Cardiac Enzymes: No results found for this basename: CKTOTAL:5,CKMB:5,CKMBINDEX:5,TROPONINI:5 in the last 168 hours BNP: BNP (last 3 results) No results found for this basename: PROBNP:3 in the last 8760 hours CBG: No results found for this basename:  GLUCAP:5 in the last 168 hours     Signed:  Marlin Canary  [facility] 03/20/2012, 11:14 AM

## 2012-03-20 NOTE — Progress Notes (Signed)
Physical Therapy Treatment Patient Details Name: Jean Mitchell MRN: [id number] DOB: 08/06/1927 Today's Date: 03/20/2012 Time: 6213-0865 PT Time Calculation (min): 31 min  PT Assessment / Plan / Recommendation Comments on Treatment Session  Pt able to increase ambulation distance today and performed exercises in recliner.  SaO2 89% upon return to recliner on room air so reapplied 2L O2 [place].  Pt also reports increased fatigue today and heavy breathing with activity so rest breaks taken as needed.    Follow Up Recommendations  SNF;Supervision/Assistance - 24 hour     Does the patient have the potential to tolerate intense rehabilitation     Barriers to Discharge        Equipment Recommendations  None recommended by PT    Recommendations for Other Services    Frequency     Plan Discharge plan remains appropriate;Frequency remains appropriate    Precautions / Restrictions Precautions Precautions: Fall Restrictions Weight Bearing Restrictions: Yes LLE Weight Bearing: Partial weight bearing LLE Partial Weight Bearing Percentage or Pounds: 50%   Pertinent Vitals/Pain Pt reports some pain in L hip but states "bearable"  And RN in to give meds upon leaving room    Mobility  Bed Mobility Bed Mobility: Supine to Sit Supine to Sit: 5: Supervision;HOB elevated Details for Bed Mobility Assistance: verbal cues for technique including using UEs to assist L LE off bed, increased time Transfers Transfers: Stand to Sit;Sit to Stand Sit to Stand: With upper extremity assist;From bed;4: Min assist Stand to Sit: To chair/3-in-1;With upper extremity assist;4: Min assist Details for Transfer Assistance: verbal cues for PWB and safe technique, assist to rise and steady and control descent Ambulation/Gait Ambulation/Gait Assistance: 4: Min assist (pt felt unable to take steps today) Ambulation Distance (Feet): 60 Feet Assistive device: Rolling walker Ambulation/Gait Assistance Details: verbal cues  for RW distance, sequence, step length Gait Pattern: Step-to pattern;Decreased stride length;Narrow base of support;Antalgic Gait velocity: decreased    Exercises Total Joint Exercises Ankle Circles/Pumps: AROM;Both;20 reps Quad Sets: AROM;Both;20 reps Heel Slides: AROM;Left;15 reps;Supine Hip ABduction/ADduction: AAROM;15 reps;Supine;Left Straight Leg Raises: AAROM;Left;10 reps;Supine Long Arc Quad: AROM;Left;15 reps;Seated   PT Diagnosis:    PT Problem List:   PT Treatment Interventions:     PT Goals Acute Rehab PT Goals PT Goal: Supine/Side to Sit - Progress: Met PT Goal: Sit to Stand - Progress: Progressing toward goal PT Goal: Stand to Sit - Progress: Progressing toward goal PT Goal: Ambulate - Progress: Progressing toward goal PT Goal: Perform Home Exercise Program - Progress: Progressing toward goal  Visit Information  Last PT Received On: 03/20/12 Assistance Needed: +1    Subjective Data  Subjective: I'm ready.   Cognition  Cognition Overall Cognitive Status: Appears within functional limits for tasks assessed/performed Arousal/Alertness: Awake/alert Orientation Level: Appears intact for tasks assessed Behavior During Session: [facility] for tasks performed    Balance     End of Session PT - End of Session Equipment Utilized During Treatment: Gait belt Activity Tolerance: Patient tolerated treatment well Patient left: in chair;with call bell/phone within reach;with nursing in room   GP     Aysen Shieh,KATHrine E 03/20/2012, 10:49 AM Zenovia Jarred, PT, DPT 03/20/2012 Pager: [phone]

## 2012-03-28 ENCOUNTER — Other Ambulatory Visit: Payer: Self-pay | Admitting: Internal Medicine

## 2012-03-31 ENCOUNTER — Other Ambulatory Visit (HOSPITAL_COMMUNITY): Payer: Self-pay | Admitting: Orthopedic Surgery

## 2012-03-31 ENCOUNTER — Ambulatory Visit (HOSPITAL_COMMUNITY)
Admission: RE | Admit: 2012-03-31 | Discharge: 2012-03-31 | Disposition: A | Discharge status: Home / Self Care | Payer: Medicare Other | Source: Ambulatory Visit | Attending: Orthopedic Surgery | Admitting: Orthopedic Surgery

## 2012-03-31 DIAGNOSIS — M7989 Other specified soft tissue disorders: Secondary | ICD-10-CM

## 2012-03-31 DIAGNOSIS — M25569 Pain in unspecified knee: Secondary | ICD-10-CM

## 2012-03-31 DIAGNOSIS — M79609 Pain in unspecified limb: Secondary | ICD-10-CM

## 2012-03-31 NOTE — Progress Notes (Signed)
*  Preliminary Results* Left lower extremity venous duplex completed. Left lower extremity is negative for deep vein thrombosis. There is no evidence of left Baker's cyst. Preliminary results discussed with Toni Amend of Dr. Nilsa Nutting office.  03/31/2012 11:15 AM Gertie Fey, RDMS, RDCS

## 2012-04-04 ENCOUNTER — Telehealth: Payer: Self-pay

## 2012-04-04 DIAGNOSIS — R413 Other amnesia: Secondary | ICD-10-CM

## 2012-04-04 NOTE — Telephone Encounter (Signed)
Referral done to neurology  Would still refer to [facility] as well as they have a Child psychotherapist employed by them

## 2012-04-04 NOTE — Telephone Encounter (Signed)
Sandi the patients daughter called to inform the patient fell and broke her hip and is in rehab for another 3 weeks.  She stated the patient has fallen approximately 10 times in the home.  The family states the patient is acting ugly, not herself, in denial anything is wrong with her and not capable of taking care of herself alone at home.  There is no one to care for her.  She is scheduled to see you on 05/28/12 and the family would like to coordinate a visit before being discharged back home.  They would like her to be referred for Dementia testing and determine where from there she should go as cannot live alone.  The daughters cell number is [phone].

## 2012-04-04 NOTE — Telephone Encounter (Signed)
Patients daughter informed of both referrals

## 2012-04-04 NOTE — Telephone Encounter (Signed)
Informed the family of MD instructions on home.  The daughter would like the patient referred today for testing for dementia please as the family is very concerned as to what to do for her

## 2012-04-04 NOTE — Telephone Encounter (Signed)
Unfortunately - In the outpt setting, the family is responsible for the patient unless other options are arranged.  We can see her at [facility] prior to d/c home from rehab,  but if family realizes she cannot live alone further, the family should start now on looking into options such as local area Assisted Living or other Nursing Home.  Social Services is not helpful in the outpt setting for this purpose (they only help in the inpatient setting).  If this is not able to be arranged, pt will need to live with family (or family live with her) until it can be arranged.  If pt has further difficulty such as pt going home and falling again (and family o/w unable to care for her(, she may need to be taken back to the hospital for admit for "placement."    We should refer the pt/family to [facility], possibly there can be some help there

## 2012-04-08 ENCOUNTER — Other Ambulatory Visit: Payer: Self-pay

## 2012-04-08 MED ORDER — HYDROCODONE-ACETAMINOPHEN 5-325 MG PO TABS: 1.0000 | ORAL_TABLET | Freq: Four times a day (QID) | ORAL | Status: DC | PRN

## 2012-04-08 NOTE — Telephone Encounter (Signed)
Done hardcopy to robin  

## 2012-04-09 NOTE — Telephone Encounter (Signed)
Faxed hardcopy to pharmacy. 

## 2012-04-24 ENCOUNTER — Other Ambulatory Visit (INDEPENDENT_AMBULATORY_CARE_PROVIDER_SITE_OTHER): Payer: Medicare Other

## 2012-04-24 ENCOUNTER — Ambulatory Visit (INDEPENDENT_AMBULATORY_CARE_PROVIDER_SITE_OTHER): Payer: Medicare Other | Admitting: Internal Medicine

## 2012-04-24 ENCOUNTER — Encounter: Payer: Self-pay | Admitting: Internal Medicine

## 2012-04-24 VITALS — BP 122/70 | HR 80 | Temp 98.1°F | Ht 65.5 in | Wt 125.5 lb

## 2012-04-24 DIAGNOSIS — D509 Iron deficiency anemia, unspecified: Secondary | ICD-10-CM

## 2012-04-24 DIAGNOSIS — R7309 Other abnormal glucose: Secondary | ICD-10-CM

## 2012-04-24 DIAGNOSIS — R7302 Impaired glucose tolerance (oral): Secondary | ICD-10-CM

## 2012-04-24 DIAGNOSIS — S72142S Displaced intertrochanteric fracture of left femur, sequela: Secondary | ICD-10-CM

## 2012-04-24 DIAGNOSIS — S72009S Fracture of unspecified part of neck of unspecified femur, sequela: Secondary | ICD-10-CM

## 2012-04-24 DIAGNOSIS — K5900 Constipation, unspecified: Secondary | ICD-10-CM

## 2012-04-24 DIAGNOSIS — I10 Essential (primary) hypertension: Secondary | ICD-10-CM

## 2012-04-24 LAB — HEPATIC FUNCTION PANEL
ALT: 19 U/L (ref 0–35)
Bilirubin, Direct: 0.1 mg/dL (ref 0.0–0.3)
Total Bilirubin: 0.5 mg/dL (ref 0.3–1.2)

## 2012-04-24 LAB — CBC WITH DIFFERENTIAL/PLATELET
Basophils Absolute: 0.1 10*3/uL (ref 0.0–0.1)
Eosinophils Absolute: 0.1 10*3/uL (ref 0.0–0.7)
HCT: 34.6 % — ABNORMAL LOW (ref 36.0–46.0)
Lymphs Abs: 1.5 10*3/uL (ref 0.7–4.0)
MCHC: 33 g/dL (ref 30.0–36.0)
MCV: 95.4 fl (ref 78.0–100.0)
Monocytes Absolute: 0.8 10*3/uL (ref 0.1–1.0)
Neutrophils Relative %: 62.4 % (ref 43.0–77.0)
Platelets: 290 10*3/uL (ref 150.0–400.0)
RDW: 15.1 % — ABNORMAL HIGH (ref 11.5–14.6)
WBC: 6.6 10*3/uL (ref 4.5–10.5)

## 2012-04-24 LAB — IBC PANEL: Iron: 70 ug/dL (ref 42–145)

## 2012-04-24 NOTE — Assessment & Plan Note (Signed)
For f/u cbc,iron today; I hope to be able to reduce her iron intake to help with the constipation

## 2012-04-24 NOTE — Assessment & Plan Note (Addendum)
Has planned f/u with ortho soon, cont lovenox daily until then  Note:  Total time for pt hx, exam, review of record with pt in the room, determination of diagnoses and plan for further eval and tx is > 40 min, with over 50% spent in coordination and counseling of patient

## 2012-04-24 NOTE — Patient Instructions (Addendum)
Please continue all other medications as before, and refills have been done if requested. You are given the copy of the medication list from [facility], your list today will be updated to reflect this You should only take Klonopin to go home (we can stop the librium and xanax) Please go to the LAB in the Basement (turn left off the elevator) for the tests to be done today You will be contacted by phone if any changes need to be made immediately.  Otherwise, you will receive a letter about your results with an explanation, but please check with MyChart first. Thank you for enrolling in MyChart. Please follow the instructions below to securely access your online medical record. MyChart allows you to send messages to your doctor, view your test results, renew your prescriptions, schedule appointments, and more. Please return in 3 months, or sooner if needed

## 2012-04-24 NOTE — Assessment & Plan Note (Signed)
stable overall by history and exam, recent data reviewed with pt, and pt to continue medical treatment as before,  to f/u any worsening symptoms or concerns BP Readings from Last 3 Encounters:  04/24/12 122/70  03/20/12 100/50  03/20/12 100/50

## 2012-04-24 NOTE — Progress Notes (Signed)
Subjective:    Patient ID: Jean Mitchell, female    DOB: 04-23-1927, 79 y.o.   MRN: [id number]  HPI  Here to f/u with family, son brings list of meds from pharmacy which includes klonopin, xanax and librium and expressed concern she may be taking too much.  Current med list from [facility] where she is now in rehab lists only klonopin and he is given a copy of the list today and concern validated.  Pt is s/p recent left hip fx that was tx with surgury with spinal anesthesia due to risk; still on lovenox daily low dose without overt bleeding or bruising.  Had significant anemia post op with hgb 8.9.  Main complaint by pt is ongoing constipation with hard stool and abd discomfort without n/v and denies worsening reflux, dysphagia, n/v, or blood.  Has been on colace and iron bid.    Pt denies fever, wt loss, night sweats, loss of appetite, or other constitutional symptoms. Past Medical History  Diagnosis Date  . Breast cancer 2007    lumpectomy, XRT previously on Tamoxifen   . Hyperlipidemia   . CHF (congestive heart failure)   . COPD (chronic obstructive pulmonary disease)     o2 at night  . Depression   . AAA (abdominal aortic aneurysm)   . Back pain     lumbar, chronic  . Hypertension   . Osteoporosis     dexa 02-18-08  . Insomnia   . Esophageal stricture   . Adenomatous colon polyp   . Hip fracture 08/2009    R, s/p ORIF  . Arthritis   . Ulcer   . Heart disease   . Diverticulosis   . Anxiety 02/15/2011  . Impaired glucose tolerance 02/18/2011  . Internal hemorrhoids   . 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 Presbyesophagus   . Gastritis   . Tubular adenoma    Past Surgical History  Procedure Laterality Date  . Oophorectomy      unilateral  . Knee arthroscopy      right   . Hemorrhoid surgery  1965  . Mastectomy, partial  2007    left  . Abdominal hysterectomy  1968  . Tonsillectomy  1968  . Rotator cuff repair    . Anal carcinoma resection    . Kyphosis surgery       balloon  . Cesarean section      x 2  . Hip fracture surgery  2011    right with plate with persistent weakness of the RLE  . Femur im nail  03/17/2012    Procedure: INTRAMEDULLARY (IM) NAIL FEMORAL;  Surgeon: Shelda Pal, MD;  Location: [facility];  Service: Orthopedics;  Laterality: Left;    reports that she has been smoking Cigarettes.  She has a 15 pack-year smoking history. She has never used smokeless tobacco. She reports that she does not drink alcohol or use illicit drugs. family history includes Breast cancer in her sister; Cancer in her mother; Colon cancer (age of onset: 3) in her father; Colon polyps in her sister; and Heart disease in her mother and sister. Allergies  Allergen Reactions  . Ezetimibe-Simvastatin     REACTION: unspecified  . Gabapentin     REACTION: severe reaction, whelps  . Percocet (Oxycodone-Acetaminophen) Nausea And Vomiting    5/325 mg   Current Outpatient Prescriptions on File Prior to Visit  Medication Sig Dispense Refill  . albuterol (PROVENTIL HFA;VENTOLIN HFA) 108 (90 BASE) MCG/ACT inhaler Inhale  2 puffs into the lungs every 6 (six) hours as needed. For shortness of breath.      Marland Kitchen albuterol (PROVENTIL) (5 MG/ML) 0.5% nebulizer solution Take 0.5 mLs (2.5 mg total) by nebulization 3 (three) times daily.  20 mL    . clonazePAM (KLONOPIN) 0.5 MG tablet Take 1 tablet (0.5 mg total) by mouth 2 (two) times daily.  30 tablet  0  . docusate sodium 100 MG CAPS Take 100 mg by mouth 2 (two) times daily.  10 capsule    . enoxaparin (LOVENOX) 40 MG/0.4ML injection Inject 0.4 mLs (40 mg total) into the skin daily.  12 Syringe  0  . ferrous sulfate 325 (65 FE) MG tablet Take 1 tablet (325 mg total) by mouth 3 (three) times daily after meals.  42 tablet  0  . fluticasone (FLONASE) 50 MCG/ACT nasal spray Place 2 sprays into the nose daily.  16 g  6  . Fluticasone-Salmeterol (ADVAIR) 100-50 MCG/DOSE AEPB Inhale 1 puff into the lungs every 12 (twelve) hours.  60 each  6   . HYDROcodone-acetaminophen (NORCO/VICODIN) 5-325 MG per tablet Take 1-2 tablets by mouth every 6 (six) hours as needed for pain. With limit 6 tabs per day  120 tablet  1  . ipratropium (ATROVENT) 0.02 % nebulizer solution Take 2.5 mLs (0.5 mg total) by nebulization 3 (three) times daily.  75 mL    . pravastatin (PRAVACHOL) 40 MG tablet Take 1 tablet (40 mg total) by mouth daily.  30 tablet  11  . SPIRIVA HANDIHALER 18 MCG inhalation capsule INHALE THE CONTENTS OF 1 CAPSULE VIA HANDIHALER ONCE DAILY.  30 capsule  12   No current facility-administered medications on file prior to visit.   Review of Systems  Constitutional: Negative for unexpected weight change, or unusual diaphoresis  HENT: Negative for tinnitus.   Eyes: Negative for photophobia and visual disturbance.  Respiratory: Negative for choking and stridor.   Gastrointestinal: Negative for vomiting and blood in stool.  Genitourinary: Negative for hematuria and decreased urine volume.  Musculoskeletal: Negative for acute joint swelling Skin: Negative for color change and wound.  Neurological: Negative for tremors and numbness other than noted  Psychiatric/Behavioral: Negative for decreased concentration or  hyperactivity.       Objective:   Physical Exam BP 122/70  Pulse 80  Temp(Src) 98.1 F (36.7 C) (Oral)  Ht 5' 5.5" (1.664 m)  Wt 125 lb 8 oz (56.926 kg)  BMI 20.56 kg/m2  SpO2 96% VS noted, not ill appearing, but frail in wheelchair  HENT: Head: NCAT.  Right Ear: External ear normal.  Left Ear: External ear normal.  Eyes: Conjunctivae and EOM are normal. Pupils are equal, round, and reactive to light.  Neck: Normal range of motion. Neck supple.  Cardiovascular: Normal rate and regular rhythm.   Pulmonary/Chest: Effort normal and breath sounds normal. - no rales or wheezing Abd:  Soft, NT, non-distended, + BS Neurological: Pt is alert. Not confused  Skin: Skin is warm. No erythema.  Psychiatric: Pt behavior is  normal. Thought content normal 1+ nervous.     Assessment & Plan:

## 2012-04-24 NOTE — Assessment & Plan Note (Signed)
Ok for MOM prn, or fleets enama prn

## 2012-04-24 NOTE — Assessment & Plan Note (Signed)
stable overall by history and exam, recent data reviewed with pt, and pt to continue medical treatment as before,  to f/u any worsening symptoms or concerns Lab Results  Component Value Date   HGBA1C 5.5 10/22/2011

## 2012-05-05 ENCOUNTER — Other Ambulatory Visit: Payer: Self-pay

## 2012-05-05 MED ORDER — CLONAZEPAM 0.5 MG PO TABS: 0.5000 mg | ORAL_TABLET | Freq: Two times a day (BID) | ORAL | Status: DC

## 2012-05-05 NOTE — Telephone Encounter (Signed)
Faxed hardcopy to pharmacy. 

## 2012-05-05 NOTE — Telephone Encounter (Signed)
Done hardcopy to robin  

## 2012-05-28 ENCOUNTER — Other Ambulatory Visit: Payer: Self-pay | Admitting: Internal Medicine

## 2012-05-28 ENCOUNTER — Ambulatory Visit: Payer: Medicare Other | Admitting: Internal Medicine

## 2012-05-28 NOTE — Telephone Encounter (Signed)
Done hardcopy to robin  

## 2012-05-28 NOTE — Telephone Encounter (Signed)
Faxed hardcopy to [facility] 

## 2012-06-06 ENCOUNTER — Telehealth: Payer: Self-pay | Admitting: Internal Medicine

## 2012-06-06 NOTE — Telephone Encounter (Signed)
HHRN informed of MD instructions. 

## 2012-06-06 NOTE — Telephone Encounter (Signed)
Pt should go to ER now if feeling weak, have tendency to fall further, fever, pain or other unusual symptoms such as CP, sob

## 2012-06-06 NOTE — Telephone Encounter (Signed)
Baird Lyons with [facility] healt is calling to report patients BP in her left arm as 88-58, pt reports having a fall last week due to being dizzy, Baird Lyons can be reached at [phone] with further questions

## 2012-06-10 ENCOUNTER — Telehealth: Payer: Self-pay

## 2012-06-10 MED ORDER — CITALOPRAM HYDROBROMIDE 20 MG PO TABS: ORAL_TABLET | ORAL | Status: DC

## 2012-06-10 NOTE — Telephone Encounter (Signed)
Please advise on clarification of Celexa rx.  The patient was previously taking 1 1/2 tablets daily, patient was not aware of change? Please advise

## 2012-06-10 NOTE — Telephone Encounter (Signed)
rx corrected 

## 2012-06-16 ENCOUNTER — Telehealth: Payer: Self-pay | Admitting: *Deleted

## 2012-06-16 MED ORDER — CLONAZEPAM 0.5 MG PO TABS: 0.5000 mg | ORAL_TABLET | Freq: Two times a day (BID) | ORAL | Status: DC | PRN

## 2012-06-16 NOTE — Telephone Encounter (Signed)
R'cd fax from [facility] for refill of Clonazepam-last written 05/05/2012 #30 with 2 refills. Please advise in JWJ's absence.

## 2012-06-16 NOTE — Telephone Encounter (Signed)
Rx faxed to [facility]. 

## 2012-06-16 NOTE — Telephone Encounter (Signed)
Ok to refill 30d, no refill (per protocol covering for absent PCP) - prescription printed and signed -  

## 2012-06-24 ENCOUNTER — Other Ambulatory Visit: Payer: Self-pay

## 2012-06-24 MED ORDER — CLONAZEPAM 0.5 MG PO TABS: 0.5000 mg | ORAL_TABLET | Freq: Two times a day (BID) | ORAL | Status: DC | PRN

## 2012-06-24 NOTE — Telephone Encounter (Signed)
Faxed hardcopy to [facility] 

## 2012-06-24 NOTE — Telephone Encounter (Signed)
Done hardcopy to robin  

## 2012-07-10 ENCOUNTER — Other Ambulatory Visit: Payer: Self-pay

## 2012-07-10 MED ORDER — HYDROCODONE-ACETAMINOPHEN 5-325 MG PO TABS: ORAL_TABLET | ORAL | Status: DC

## 2012-07-10 NOTE — Telephone Encounter (Signed)
Klonopin - too soon  Pain med - Done hardcopy to [facility]

## 2012-07-10 NOTE — Telephone Encounter (Signed)
Faxed hardcopy to [employer] informed too soon for [employer]

## 2012-07-25 ENCOUNTER — Encounter: Payer: Self-pay | Admitting: Internal Medicine

## 2012-07-25 ENCOUNTER — Ambulatory Visit (INDEPENDENT_AMBULATORY_CARE_PROVIDER_SITE_OTHER): Payer: Medicare Other | Admitting: Internal Medicine

## 2012-07-25 VITALS — BP 130/72 | HR 86 | Temp 98.4°F | Ht 65.0 in | Wt 118.0 lb

## 2012-07-25 DIAGNOSIS — R7309 Other abnormal glucose: Secondary | ICD-10-CM

## 2012-07-25 DIAGNOSIS — M79609 Pain in unspecified limb: Secondary | ICD-10-CM

## 2012-07-25 DIAGNOSIS — J449 Chronic obstructive pulmonary disease, unspecified: Secondary | ICD-10-CM

## 2012-07-25 DIAGNOSIS — M545 Low back pain: Secondary | ICD-10-CM

## 2012-07-25 DIAGNOSIS — M79605 Pain in left leg: Secondary | ICD-10-CM

## 2012-07-25 DIAGNOSIS — I10 Essential (primary) hypertension: Secondary | ICD-10-CM

## 2012-07-25 DIAGNOSIS — R7302 Impaired glucose tolerance (oral): Secondary | ICD-10-CM

## 2012-07-25 MED ORDER — TIZANIDINE HCL 4 MG PO TABS: 4.0000 mg | ORAL_TABLET | Freq: Four times a day (QID) | ORAL | Status: DC | PRN

## 2012-07-25 NOTE — Patient Instructions (Signed)
Please take all new medication as prescribed - the muscle relaxer as needed Please go to the XRAY Department in the Basement (go straight as you get off the elevator) for the x-ray testing Please consider Dr Charlann Boxer if the pain is worsening Please continue all other medications as before, and refills have been done if requested. Please have the pharmacy call with any other refills you may need.  Thank you for enrolling in MyChart. Please follow the instructions below to securely access your online medical record. MyChart allows you to send messages to your doctor, view your test results, renew your prescriptions, schedule appointments, and more.  Please return in 6 months, or sooner if needed

## 2012-07-25 NOTE — Progress Notes (Signed)
Subjective:    Patient ID: Jean Mitchell, female    DOB: April 22, 1927, 79 y.o.   MRN: [id number]  HPI   Here to f/u after falls x 2 at home without apparent immediate injury, has not seen ER or other, Uses walker at home, using cane today, c/o left LBP,without bowel or bladder change, fever, wt loss,  worsening LE pain/numbness/weakness, but does have some increased pain to the left med anterior thigh, reminds her of her intertrochanteric fx, and asks for film today.  State her Left lower pain is mild at worst, worse to stand up, better with muscle relaxer in the past and thinks this is all that is needed. Has seen Dr Madelin Rear but is declining f/u at this time. Pt denies chest pain, increased sob or doe, wheezing, orthopnea, PND, increased LE swelling, palpitations, dizziness or syncope.  Denies urinary symptoms such as dysuria, frequency, urgency, flank pain, hematuria or n/v, fever, chills. Reasons for falls not clear, is declining PT for now Past Medical History  Diagnosis Date  . Breast cancer 2007    lumpectomy, XRT previously on Tamoxifen   . Hyperlipidemia   . CHF (congestive heart failure)   . COPD (chronic obstructive pulmonary disease)     o2 at night  . Depression   . AAA (abdominal aortic aneurysm)   . Back pain     lumbar, chronic  . Hypertension   . Osteoporosis     dexa 02-18-08  . Insomnia   . Esophageal stricture   . Adenomatous colon polyp   . Hip fracture 08/2009    R, s/p ORIF  . Arthritis   . Ulcer   . Heart disease   . Diverticulosis   . Anxiety 02/15/2011  . Impaired glucose tolerance 02/18/2011  . Internal hemorrhoids   . 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 Presbyesophagus   . Gastritis   . Tubular adenoma    Past Surgical History  Procedure Laterality Date  . Oophorectomy      unilateral  . Knee arthroscopy      right   . Hemorrhoid surgery  1965  . Mastectomy, partial  2007    left  . Abdominal hysterectomy  1968  . Tonsillectomy  1968  . Rotator  cuff repair    . Anal carcinoma resection    . Kyphosis surgery      balloon  . Cesarean section      x 2  . Hip fracture surgery  2011    right with plate with persistent weakness of the RLE  . Femur im nail  03/17/2012    Procedure: INTRAMEDULLARY (IM) NAIL FEMORAL;  Surgeon: Shelda Pal, MD;  Location: [facility];  Service: Orthopedics;  Laterality: Left;    reports that she has been smoking Cigarettes.  She has a 15 pack-year smoking history. She has never used smokeless tobacco. She reports that she does not drink alcohol or use illicit drugs. family history includes Breast cancer in her sister; Cancer in her mother; Colon cancer (age of onset: 58) in her father; Colon polyps in her sister; and Heart disease in her mother and sister. Allergies  Allergen Reactions  . Ezetimibe-Simvastatin     REACTION: unspecified  . Gabapentin     REACTION: severe reaction, whelps  . Percocet (Oxycodone-Acetaminophen) Nausea And Vomiting    5/325 mg   Current Outpatient Prescriptions on File Prior to Visit  Medication Sig Dispense Refill  . albuterol (PROVENTIL HFA;VENTOLIN HFA) 108 (90  BASE) MCG/ACT inhaler Inhale 2 puffs into the lungs every 6 (six) hours as needed. For shortness of breath.      Marland Kitchen albuterol (PROVENTIL) (5 MG/ML) 0.5% nebulizer solution Take 0.5 mLs (2.5 mg total) by nebulization 3 (three) times daily.  20 mL    . citalopram (CELEXA) 20 MG tablet Take 1 and 1/2 tabs by by per day by mouth  135 tablet  3  . clonazePAM (KLONOPIN) 0.5 MG tablet Take 1 tablet (0.5 mg total) by mouth 2 (two) times daily as needed for anxiety.  30 tablet  2  . docusate sodium 100 MG CAPS Take 100 mg by mouth 2 (two) times daily.  10 capsule    . enoxaparin (LOVENOX) 40 MG/0.4ML injection Inject 0.4 mLs (40 mg total) into the skin daily.  12 Syringe  0  . ferrous sulfate 325 (65 FE) MG tablet Take 1 tablet (325 mg total) by mouth 3 (three) times daily after meals.  42 tablet  0  . fluticasone (FLONASE) 50  MCG/ACT nasal spray Place 2 sprays into the nose daily.  16 g  6  . Fluticasone-Salmeterol (ADVAIR) 100-50 MCG/DOSE AEPB Inhale 1 puff into the lungs every 12 (twelve) hours.  60 each  6  . furosemide (LASIX) 20 MG tablet Take 20 mg by mouth 2 (two) times daily.      Marland Kitchen HYDROcodone-acetaminophen (NORCO/VICODIN) 5-325 MG per tablet TAKE 1 TO 2 TABLETS BY MOUTH EVERY 6 HOURS AS NEEDED FOR PAIN. LIMIT 6 TABLETS PER DAY.  120 tablet  1  . ipratropium (ATROVENT) 0.02 % nebulizer solution Take 2.5 mLs (0.5 mg total) by nebulization 3 (three) times daily.  75 mL    . pravastatin (PRAVACHOL) 40 MG tablet Take 1 tablet (40 mg total) by mouth daily.  30 tablet  11  . SPIRIVA HANDIHALER 18 MCG inhalation capsule INHALE THE CONTENTS OF 1 CAPSULE VIA HANDIHALER ONCE DAILY.  30 capsule  12   No current facility-administered medications on file prior to visit.   Review of Systems  Constitutional: Negative for unexpected weight change, or unusual diaphoresis  HENT: Negative for tinnitus.   Eyes: Negative for photophobia and visual disturbance.  Respiratory: Negative for choking and stridor.   Gastrointestinal: Negative for vomiting and blood in stool.  Genitourinary: Negative for hematuria and decreased urine volume.  Musculoskeletal: Negative for acute joint swelling Skin: Negative for color change and wound.  Neurological: Negative for tremors and numbness other than noted  Psychiatric/Behavioral: Negative for decreased concentration or  hyperactivity.       Objective:   Physical Exam BP 130/72  Pulse 86  Temp(Src) 98.4 F (36.9 C) (Oral)  Ht 5\' 5"  (1.651 m)  Wt 118 lb (53.524 kg)  BMI 19.64 kg/m2  SpO2 93% VS noted, not ill appearing Constitutional: Pt appears thin, somewhat frail.  HENT: Head: NCAT.  Right Ear: External ear normal.  Left Ear: External ear normal.  Eyes: Conjunctivae and EOM are normal. Pupils are equal, round, and reactive to light.  Neck: Normal range of motion. Neck  supple.  Cardiovascular: Normal rate and regular rhythm.   Pulmonary/Chest: Effort normal and breath sounds normal.  Abd:  Soft, NT, non-distended, + BS Spine: nontender except for left lumbar paravertebral spasm Neurological: Pt is alert. Not confused , motor 5/5 but moving slowly Skin: Skin is warm. No erythema. No LE edema, no rash Psychiatric: Pt behavior is normal. Thought content normal. mild nervous    Assessment & Plan:

## 2012-07-26 DIAGNOSIS — M79605 Pain in left leg: Secondary | ICD-10-CM | POA: Insufficient documentation

## 2012-07-26 NOTE — Assessment & Plan Note (Signed)
stable overall by history and exam, recent data reviewed with pt, and pt to continue medical treatment as before,  to f/u any worsening symptoms or concerns Lab Results  Component Value Date   HGBA1C 5.5 10/22/2011

## 2012-07-26 NOTE — Assessment & Plan Note (Signed)
With acute msk spasm it seems left lumbar - for musce relaxer prn

## 2012-07-26 NOTE — Assessment & Plan Note (Signed)
stable overall by history and exam, recent data reviewed with pt, and pt to continue medical treatment as before,  to f/u any worsening symptoms or concerns BP Readings from Last 3 Encounters:  07/25/12 130/72  04/24/12 122/70  03/20/12 100/50

## 2012-07-26 NOTE — Assessment & Plan Note (Signed)
stable overall by history and exam, recent data reviewed with pt, and pt to continue medical treatment as before,  to f/u any worsening symptoms or concerns SpO2 Readings from Last 3 Encounters:  07/25/12 93%  04/24/12 96%  03/20/12 92%

## 2012-07-26 NOTE — Assessment & Plan Note (Signed)
Ok for film as reqeusted per pt, to f/u any worsening symptoms or concerns

## 2012-07-31 ENCOUNTER — Other Ambulatory Visit: Payer: Self-pay | Admitting: Internal Medicine

## 2012-08-06 ENCOUNTER — Emergency Department (HOSPITAL_COMMUNITY)
Admission: EM | Admit: 2012-08-06 | Discharge: 2012-08-06 | Disposition: A | Discharge status: Home / Self Care | Payer: Medicare Other | Attending: Emergency Medicine | Admitting: Emergency Medicine

## 2012-08-06 ENCOUNTER — Emergency Department (HOSPITAL_COMMUNITY): Payer: Medicare Other

## 2012-08-06 ENCOUNTER — Encounter (HOSPITAL_COMMUNITY): Payer: Self-pay | Admitting: Emergency Medicine

## 2012-08-06 DIAGNOSIS — Z8679 Personal history of other diseases of the circulatory system: Secondary | ICD-10-CM | POA: Insufficient documentation

## 2012-08-06 DIAGNOSIS — I509 Heart failure, unspecified: Secondary | ICD-10-CM | POA: Insufficient documentation

## 2012-08-06 DIAGNOSIS — Z862 Personal history of diseases of the blood and blood-forming organs and certain disorders involving the immune mechanism: Secondary | ICD-10-CM | POA: Insufficient documentation

## 2012-08-06 DIAGNOSIS — Y939 Activity, unspecified: Secondary | ICD-10-CM | POA: Insufficient documentation

## 2012-08-06 DIAGNOSIS — IMO0002 Reserved for concepts with insufficient information to code with codable children: Secondary | ICD-10-CM | POA: Insufficient documentation

## 2012-08-06 DIAGNOSIS — S335XXA Sprain of ligaments of lumbar spine, initial encounter: Secondary | ICD-10-CM | POA: Insufficient documentation

## 2012-08-06 DIAGNOSIS — Z85048 Personal history of other malignant neoplasm of rectum, rectosigmoid junction, and anus: Secondary | ICD-10-CM | POA: Insufficient documentation

## 2012-08-06 DIAGNOSIS — K5900 Constipation, unspecified: Secondary | ICD-10-CM | POA: Insufficient documentation

## 2012-08-06 DIAGNOSIS — Z8781 Personal history of (healed) traumatic fracture: Secondary | ICD-10-CM | POA: Insufficient documentation

## 2012-08-06 DIAGNOSIS — R296 Repeated falls: Secondary | ICD-10-CM | POA: Insufficient documentation

## 2012-08-06 DIAGNOSIS — Z8639 Personal history of other endocrine, nutritional and metabolic disease: Secondary | ICD-10-CM | POA: Insufficient documentation

## 2012-08-06 DIAGNOSIS — S39012A Strain of muscle, fascia and tendon of lower back, initial encounter: Secondary | ICD-10-CM

## 2012-08-06 DIAGNOSIS — Y929 Unspecified place or not applicable: Secondary | ICD-10-CM | POA: Insufficient documentation

## 2012-08-06 DIAGNOSIS — Z8742 Personal history of other diseases of the female genital tract: Secondary | ICD-10-CM | POA: Insufficient documentation

## 2012-08-06 DIAGNOSIS — Z8739 Personal history of other diseases of the musculoskeletal system and connective tissue: Secondary | ICD-10-CM | POA: Insufficient documentation

## 2012-08-06 DIAGNOSIS — Z79899 Other long term (current) drug therapy: Secondary | ICD-10-CM | POA: Insufficient documentation

## 2012-08-06 DIAGNOSIS — F411 Generalized anxiety disorder: Secondary | ICD-10-CM | POA: Insufficient documentation

## 2012-08-06 DIAGNOSIS — Z872 Personal history of diseases of the skin and subcutaneous tissue: Secondary | ICD-10-CM | POA: Insufficient documentation

## 2012-08-06 DIAGNOSIS — M129 Arthropathy, unspecified: Secondary | ICD-10-CM | POA: Insufficient documentation

## 2012-08-06 DIAGNOSIS — J449 Chronic obstructive pulmonary disease, unspecified: Secondary | ICD-10-CM | POA: Insufficient documentation

## 2012-08-06 DIAGNOSIS — J4489 Other specified chronic obstructive pulmonary disease: Secondary | ICD-10-CM | POA: Insufficient documentation

## 2012-08-06 DIAGNOSIS — F17200 Nicotine dependence, unspecified, uncomplicated: Secondary | ICD-10-CM | POA: Insufficient documentation

## 2012-08-06 DIAGNOSIS — R350 Frequency of micturition: Secondary | ICD-10-CM | POA: Insufficient documentation

## 2012-08-06 DIAGNOSIS — Z8719 Personal history of other diseases of the digestive system: Secondary | ICD-10-CM | POA: Insufficient documentation

## 2012-08-06 DIAGNOSIS — Z853 Personal history of malignant neoplasm of breast: Secondary | ICD-10-CM | POA: Insufficient documentation

## 2012-08-06 DIAGNOSIS — I10 Essential (primary) hypertension: Secondary | ICD-10-CM | POA: Insufficient documentation

## 2012-08-06 LAB — POCT I-STAT, CHEM 8
Calcium, Ion: 1.2 mmol/L (ref 1.13–1.30)
Glucose, Bld: 83 mg/dL (ref 70–99)
HCT: 36 % (ref 36.0–46.0)
Hemoglobin: 12.2 g/dL (ref 12.0–15.0)
Potassium: 3.9 mEq/L (ref 3.5–5.1)

## 2012-08-06 LAB — CBC WITH DIFFERENTIAL/PLATELET
Basophils Relative: 1 % (ref 0–1)
Eosinophils Absolute: 0.1 10*3/uL (ref 0.0–0.7)
Eosinophils Relative: 3 % (ref 0–5)
Lymphs Abs: 1.1 10*3/uL (ref 0.7–4.0)
MCH: 30.4 pg (ref 26.0–34.0)
MCHC: 31.8 g/dL (ref 30.0–36.0)
MCV: 95.6 fL (ref 78.0–100.0)
Neutrophils Relative %: 60 % (ref 43–77)
Platelets: 264 10*3/uL (ref 150–400)
RBC: 3.65 MIL/uL — ABNORMAL LOW (ref 3.87–5.11)

## 2012-08-06 LAB — URINALYSIS, ROUTINE W REFLEX MICROSCOPIC
Bilirubin Urine: NEGATIVE
Leukocytes, UA: NEGATIVE
Nitrite: NEGATIVE
Specific Gravity, Urine: 1.012 (ref 1.005–1.030)
Urobilinogen, UA: 0.2 mg/dL (ref 0.0–1.0)
pH: 8 (ref 5.0–8.0)

## 2012-08-06 LAB — URINE MICROSCOPIC-ADD ON

## 2012-08-06 MED ORDER — HYDROMORPHONE HCL 2 MG PO TABS
2.0000 mg | ORAL_TABLET | ORAL | Status: DC | PRN
Start: 2012-08-06 — End: 2012-11-04

## 2012-08-06 MED ORDER — MORPHINE SULFATE 4 MG/ML IJ SOLN
4.0000 mg | Freq: Once | INTRAMUSCULAR | Status: AC
  Administered 2012-08-06: 4 mg via INTRAVENOUS
  Filled 2012-08-06: qty 1

## 2012-08-06 MED ORDER — ONDANSETRON HCL 4 MG/2ML IJ SOLN
4.0000 mg | Freq: Once | INTRAMUSCULAR | Status: AC
  Administered 2012-08-06: 4 mg via INTRAVENOUS
  Filled 2012-08-06: qty 2

## 2012-08-06 MED ORDER — SODIUM CHLORIDE 0.9 % IV SOLN
Freq: Once | INTRAVENOUS | Status: AC
  Administered 2012-08-06: 12:00:00 via INTRAVENOUS

## 2012-08-06 NOTE — ED Provider Notes (Signed)
History    CSN: [id number] Arrival date & time 08/06/12  1009  First MD Initiated Contact with Patient 08/06/12 1025     Chief Complaint  Patient presents with  . Back Pain   (Consider location/radiation/quality/duration/timing/severity/associated sxs/prior Treatment) Patient is a 79 y.o. female presenting with back pain. The history is provided by the patient and a relative.  Back Pain Associated symptoms: no abdominal pain, no chest pain, no headaches, no numbness and no weakness    patient had a fall around a month ago. She had a seat on her toilet that was unsteady. She fell and when he returned. He states since then she's had low back pain. It is worse with movements. She states it is now starting to radiate down both of her legs. She has some chronic urinary frequency but states it has been worse recently. No nausea or vomiting. No diarrhea. She states that deals with chronic constipation. She states she has appointment on Monday with her Dr. but the pain is too much. It is unrelieved by her Norco. No lightheadedness or dizziness. She has a known AAA. Past Medical History  Diagnosis Date  . Breast cancer 2007    lumpectomy, XRT previously on Tamoxifen   . Hyperlipidemia   . CHF (congestive heart failure)   . COPD (chronic obstructive pulmonary disease)     o2 at night  . Depression   . AAA (abdominal aortic aneurysm)   . Back pain     lumbar, chronic  . Hypertension   . Osteoporosis     dexa 02-18-08  . Insomnia   . Esophageal stricture   . Adenomatous colon polyp   . Hip fracture 08/2009    R, s/p ORIF  . Arthritis   . Ulcer   . Heart disease   . Diverticulosis   . Anxiety 02/15/2011  . Impaired glucose tolerance 02/18/2011  . Internal hemorrhoids   . 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 Presbyesophagus   . Gastritis   . Tubular adenoma    Past Surgical History  Procedure Laterality Date  . Oophorectomy      unilateral  . Knee arthroscopy      right   .  Hemorrhoid surgery  1965  . Mastectomy, partial  2007    left  . Abdominal hysterectomy  1968  . Tonsillectomy  1968  . Rotator cuff repair    . Anal carcinoma resection    . Kyphosis surgery      balloon  . Cesarean section      x 2  . Hip fracture surgery  2011    right with plate with persistent weakness of the RLE  . Femur im nail  03/17/2012    Procedure: INTRAMEDULLARY (IM) NAIL FEMORAL;  Surgeon: Shelda Pal, MD;  Location: [facility];  Service: Orthopedics;  Laterality: Left;   Family History  Problem Relation Age of Onset  . Heart disease Mother   . Cancer Mother     bladder  . Colon cancer Father 45  . Heart disease Sister   . Breast cancer Sister   . Colon polyps Sister    History  Substance Use Topics  . Smoking status: Smoker, Current Status Unknown -- 0.25 packs/day for 60 years    Types: Cigarettes  . Smokeless tobacco: Never Used  . Alcohol Use: No   OB History   Grav Para Term Preterm Abortions TAB SAB Ect Mult Living  Review of Systems  Constitutional: Negative for activity change and appetite change.  HENT: Negative for neck stiffness.   Eyes: Negative for pain.  Respiratory: Negative for chest tightness and shortness of breath.   Cardiovascular: Negative for chest pain and leg swelling.  Gastrointestinal: Positive for constipation. Negative for nausea, vomiting, abdominal pain and diarrhea.  Genitourinary: Positive for frequency. Negative for flank pain.  Musculoskeletal: Positive for back pain.  Skin: Negative for rash.  Neurological: Negative for weakness, numbness and headaches.  Psychiatric/Behavioral: Negative for behavioral problems.    Allergies  Ezetimibe-simvastatin; Gabapentin; and Percocet  Home Medications   Current Outpatient Rx  Name  Route  Sig  Dispense  Refill  . albuterol (PROVENTIL HFA;VENTOLIN HFA) 108 (90 BASE) MCG/ACT inhaler   Inhalation   Inhale 2 puffs into the lungs every 6 (six) hours as needed.  For shortness of breath.         Marland Kitchen albuterol (PROVENTIL) (5 MG/ML) 0.5% nebulizer solution   Nebulization   Take 0.5 mLs (2.5 mg total) by nebulization 3 (three) times daily.   20 mL      . chlordiazePOXIDE (LIBRIUM) 5 MG capsule   Oral   Take 5-10 mg by mouth 3 (three) times daily as needed for anxiety. Take 1 capsule by mouth every morning, 1 capsule in the afternoon, and 2 capsules every evening as needed.         . citalopram (CELEXA) 20 MG tablet      Take 1 and 1/2 tabs by by per day by mouth   135 tablet   3   . clonazePAM (KLONOPIN) 0.5 MG tablet   Oral   Take 1 tablet (0.5 mg total) by mouth 2 (two) times daily as needed for anxiety.   30 tablet   2   . docusate sodium 100 MG CAPS   Oral   Take 100 mg by mouth 2 (two) times daily.   10 capsule      . ferrous sulfate 325 (65 FE) MG tablet   Oral   Take 1 tablet (325 mg total) by mouth 3 (three) times daily after meals.   42 tablet   0     2-3 weeks   . fluticasone (FLONASE) 50 MCG/ACT nasal spray   Nasal   Place 2 sprays into the nose daily.   16 g   6   . furosemide (LASIX) 20 MG tablet   Oral   Take 20 mg by mouth daily.          Marland Kitchen HYDROcodone-acetaminophen (NORCO/VICODIN) 5-325 MG per tablet   Oral   Take 1-2 tablets by mouth every 6 (six) hours as needed for pain.         . pravastatin (PRAVACHOL) 40 MG tablet   Oral   Take 1 tablet (40 mg total) by mouth daily.   30 tablet   11   . tiZANidine (ZANAFLEX) 4 MG tablet   Oral   Take 4 mg by mouth every 6 (six) hours as needed (spasms).         Marland Kitchen HYDROmorphone (DILAUDID) 2 MG tablet   Oral   Take 1 tablet (2 mg total) by mouth every 4 (four) hours as needed for pain.   20 tablet   0    BP 132/75  Pulse 75  Temp(Src) 98.5 F (36.9 C) (Oral)  Resp 18  SpO2 97% Physical Exam  Nursing note and vitals reviewed. Constitutional: She is oriented to person, place, and  time. She appears well-developed and well-nourished.  HENT:   Head: Normocephalic and atraumatic.  Eyes: EOM are normal. Pupils are equal, round, and reactive to light.  Neck: Normal range of motion. Neck supple.  Cardiovascular: Normal rate, regular rhythm and normal heart sounds.   No murmur heard. Pulmonary/Chest: Effort normal and breath sounds normal. No respiratory distress. She has no wheezes. She has no rales.  Abdominal: Soft. Bowel sounds are normal. She exhibits no distension. There is tenderness. There is no rebound and no guarding.  Patient states her back hurts with palpation of her abdomen. No masses.  Musculoskeletal:  Pain with straight leg raise bilaterally. Lower lumbar tenderness.  Neurological: She is alert and oriented to person, place, and time. No cranial nerve deficit.  Skin: Skin is warm and dry.  Psychiatric: She has a normal mood and affect. Her speech is normal.    ED Course  Procedures (including critical care time) Labs Reviewed  CBC WITH DIFFERENTIAL - Abnormal; Notable for the following:    RBC 3.65 (*)    Hemoglobin 11.1 (*)    HCT 34.9 (*)    All other components within normal limits  URINALYSIS, ROUTINE W REFLEX MICROSCOPIC - Abnormal; Notable for the following:    Hgb urine dipstick SMALL (*)    All other components within normal limits  URINE MICROSCOPIC-ADD ON  POCT I-STAT, CHEM 8   Dg Lumbar Spine Complete  08/06/2012   *RADIOLOGY REPORT*  Clinical Data: Low back pain.  LUMBAR SPINE - COMPLETE 4+ VIEW  Comparison: MR lumbar spine 06/27/2011 and lumbar spine series 08/24/2009.  Findings: Osteopenia.  Alignment appears stable, with apparent slight anterolisthesis of L4 on L5.  L1 and L2 vertebroplasties are again noted.  Loss of vertebral body height is seen at L3, L4 and L5, with concavity of the endplates.  Facet degenerative change in the mid and lower lumbar spine.  No definite pars defects.  Atherosclerotic calcification of the arterial vasculature. Abdominal aorta measures up to approximately 3.5 cm  above the bifurcation.  IMPRESSION: 1.  Osteopenia, spondylosis and compression deformities, stable from 08/24/2009. 2.  Infrarenal aortic aneurysm.   Original Report Authenticated By: Leanna Battles, M.D.   1. Lumbar strain, initial encounter     MDM  Patient with lower back pain after fall. X-ray is stable. Lab work is reassuring. Has a known AAA but since the pain occurred after the fall doubt this is the cause. Will be discharged home. She feels better after pain medicines. She'll be discharged home with some oral Dilaudid. Patient was instructed to take care with the medicine. She is not opioid nave she should be well-tolerated   Juliet Rude. Rubin Payor, MD 08/06/12 [phone]

## 2012-08-06 NOTE — ED Notes (Signed)
Pt states that she has had back pain for couple months and has gotten worse and got to the point where she cant take it anymore. Pt states that she has a doctor's apt for the back pain but not until next week.  Pt states that she had left hip replacement surgery 4 months ago.  Pt also states that she has a device that sits over top of the toilet to make it taller and wasn't fitting correctly on her toilet and she fell off of it into the floor about month ago.

## 2012-08-06 NOTE — ED Notes (Signed)
Pt ambulated with cane very well from stretcher into hallway and back to stretcher.

## 2012-08-06 NOTE — ED Notes (Signed)
Patient transported to X-ray 

## 2012-08-08 ENCOUNTER — Other Ambulatory Visit: Payer: Self-pay | Admitting: Specialist

## 2012-08-11 ENCOUNTER — Other Ambulatory Visit: Payer: Self-pay | Admitting: Internal Medicine

## 2012-08-11 ENCOUNTER — Telehealth: Payer: Self-pay | Admitting: *Deleted

## 2012-08-11 ENCOUNTER — Other Ambulatory Visit: Payer: Self-pay | Admitting: Specialist

## 2012-08-11 NOTE — Telephone Encounter (Signed)
Faxed hardcopy to [facility] 

## 2012-08-11 NOTE — Telephone Encounter (Signed)
Done hardcopy to robin  

## 2012-08-11 NOTE — Telephone Encounter (Signed)
Already done June 30

## 2012-08-11 NOTE — Telephone Encounter (Signed)
done

## 2012-08-11 NOTE — Telephone Encounter (Signed)
Pt called requesting a refill on her pain medication Hydrocodone APAP 5/325mg . Please advise

## 2012-08-12 ENCOUNTER — Ambulatory Visit
Admission: RE | Admit: 2012-08-12 | Discharge: 2012-08-12 | Disposition: A | Discharge status: Home / Self Care | Payer: Medicare Other | Source: Ambulatory Visit | Attending: Specialist | Admitting: Specialist

## 2012-08-12 ENCOUNTER — Other Ambulatory Visit: Payer: Self-pay | Admitting: Specialist

## 2012-08-12 MED ORDER — GADOBENATE DIMEGLUMINE 529 MG/ML IV SOLN
11.0000 mL | Freq: Once | INTRAVENOUS | Status: AC | PRN
  Administered 2012-08-12: 11 mL via INTRAVENOUS

## 2012-08-13 MED ORDER — KETOROLAC TROMETHAMINE 30 MG/ML IJ SOLN
30.0000 mg | Freq: Once | INTRAMUSCULAR | Status: AC
  Administered 2012-08-14: 30 mg via INTRAVENOUS

## 2012-08-13 MED ORDER — FENTANYL CITRATE 0.05 MG/ML IJ SOLN
25.0000 ug | INTRAMUSCULAR | Status: DC | PRN
  Administered 2012-08-14 (×5): 25 ug via INTRAVENOUS

## 2012-08-13 MED ORDER — SODIUM CHLORIDE 0.9 % IV SOLN
Freq: Once | INTRAVENOUS | Status: AC
  Administered 2012-08-14: 08:00:00 via INTRAVENOUS

## 2012-08-13 MED ORDER — MIDAZOLAM HCL 2 MG/2ML IJ SOLN
1.0000 mg | INTRAMUSCULAR | Status: DC | PRN
  Administered 2012-08-14 (×3): 1 mg via INTRAVENOUS
  Administered 2012-08-14: 0.5 mg via INTRAVENOUS

## 2012-08-13 MED ORDER — CEFAZOLIN SODIUM-DEXTROSE 2-3 GM-% IV SOLR
2.0000 g | Freq: Once | INTRAVENOUS | Status: AC
  Administered 2012-08-14: 2 g via INTRAVENOUS

## 2012-08-14 ENCOUNTER — Ambulatory Visit
Admission: RE | Admit: 2012-08-14 | Discharge: 2012-08-14 | Disposition: A | Discharge status: Home / Self Care | Payer: Medicare Other | Source: Ambulatory Visit | Attending: Specialist | Admitting: Specialist

## 2012-08-14 VITALS — BP 97/55 | HR 78 | Resp 12

## 2012-08-14 DIAGNOSIS — S32000A Wedge compression fracture of unspecified lumbar vertebra, initial encounter for closed fracture: Secondary | ICD-10-CM

## 2012-08-18 ENCOUNTER — Other Ambulatory Visit: Payer: Self-pay

## 2012-08-18 MED ORDER — CLONAZEPAM 0.5 MG PO TABS: 0.5000 mg | ORAL_TABLET | Freq: Two times a day (BID) | ORAL | Status: DC | PRN

## 2012-08-18 NOTE — Progress Notes (Signed)
Patient called to report having "severe pain after her T10 and L5 VPs 08/14/2012.  She states she has been in bed all weekend with her lower back pain.  She states the "pain up top" (T10) is all gone.  It's the L5 area that still is bothering her.  After discussing her pain symptoms, she says the low back pain is right in the middle of the lower back and does not radiate down either leg.  She also states the pain "might be a little less than before the procedure, but it still hurts a lot."  I encouraged patient to try to get out of bed and ambulate; this may help relieve some of the aches and stiffness from lying in bed too long.  She stated she would do this and give it another week to see how she feels.  She knows we still are waiting on Dr. Orlena Sheldon schedule for later in July to schedule her follow-up appointment.  jkl

## 2012-08-18 NOTE — Telephone Encounter (Signed)
Done hardcopy to robin  

## 2012-08-18 NOTE — Telephone Encounter (Signed)
Faxed hardcopy to pharmacy. 

## 2012-08-25 ENCOUNTER — Telehealth: Payer: Self-pay | Admitting: *Deleted

## 2012-08-25 MED ORDER — SERTRALINE HCL 50 MG PO TABS: 50.0000 mg | ORAL_TABLET | Freq: Every day | ORAL | Status: DC

## 2012-08-25 MED ORDER — TIZANIDINE HCL 4 MG PO TABS: 4.0000 mg | ORAL_TABLET | Freq: Four times a day (QID) | ORAL | Status: DC | PRN

## 2012-08-25 NOTE — Telephone Encounter (Signed)
Pt called states she spoke with the Pharmacist and he suggested she take Sertraline for her nerves and anxiety.  Please advise.

## 2012-08-25 NOTE — Telephone Encounter (Signed)
Ok for both - done erx

## 2012-08-25 NOTE — Telephone Encounter (Signed)
Patient informed. 

## 2012-08-25 NOTE — Telephone Encounter (Signed)
Refill on tizanidine

## 2012-08-27 ENCOUNTER — Other Ambulatory Visit: Payer: Self-pay | Admitting: Specialist

## 2012-08-27 ENCOUNTER — Other Ambulatory Visit (HOSPITAL_COMMUNITY): Payer: Self-pay | Admitting: Interventional Radiology

## 2012-08-27 DIAGNOSIS — IMO0002 Reserved for concepts with insufficient information to code with codable children: Secondary | ICD-10-CM

## 2012-09-03 ENCOUNTER — Other Ambulatory Visit: Payer: Medicare Other

## 2012-09-08 ENCOUNTER — Telehealth: Payer: Self-pay

## 2012-09-08 MED ORDER — CLONAZEPAM 0.5 MG PO TABS: 0.5000 mg | ORAL_TABLET | Freq: Two times a day (BID) | ORAL | Status: DC | PRN

## 2012-09-08 MED ORDER — SERTRALINE HCL 100 MG PO TABS: 100.0000 mg | ORAL_TABLET | Freq: Every day | ORAL | Status: DC

## 2012-09-08 NOTE — Telephone Encounter (Signed)
Faxed hardcopy to pharmacy.  Called the patient to inform left msg. To call back

## 2012-09-08 NOTE — Telephone Encounter (Signed)
Pharmacy is requesting an early refill on clonazepam as patient states she is taking more than 2 per day.  Please advise

## 2012-09-08 NOTE — Telephone Encounter (Signed)
Ok this time only, but since she feels needful, we should increase the zoloft to 100 qd as well  Robin to let pt know

## 2012-09-08 NOTE — Telephone Encounter (Signed)
Patient informed. 

## 2012-09-17 ENCOUNTER — Other Ambulatory Visit: Payer: Self-pay

## 2012-09-17 MED ORDER — HYDROCODONE-ACETAMINOPHEN 5-325 MG PO TABS: ORAL_TABLET | ORAL | Status: DC

## 2012-09-17 MED ORDER — CHLORDIAZEPOXIDE HCL 5 MG PO CAPS: 5.0000 mg | ORAL_CAPSULE | Freq: Three times a day (TID) | ORAL | Status: DC | PRN

## 2012-09-17 NOTE — Telephone Encounter (Signed)
Faxed hardcopy of both refills to [facility]

## 2012-09-17 NOTE — Telephone Encounter (Signed)
Done hardcopy to robin  

## 2012-09-30 ENCOUNTER — Other Ambulatory Visit (INDEPENDENT_AMBULATORY_CARE_PROVIDER_SITE_OTHER): Payer: Medicare Other

## 2012-09-30 ENCOUNTER — Ambulatory Visit (INDEPENDENT_AMBULATORY_CARE_PROVIDER_SITE_OTHER)
Admission: RE | Admit: 2012-09-30 | Discharge: 2012-09-30 | Disposition: A | Discharge status: Home / Self Care | Payer: Medicare Other | Source: Ambulatory Visit | Attending: Internal Medicine | Admitting: Internal Medicine

## 2012-09-30 ENCOUNTER — Telehealth: Payer: Self-pay

## 2012-09-30 ENCOUNTER — Ambulatory Visit (INDEPENDENT_AMBULATORY_CARE_PROVIDER_SITE_OTHER): Payer: Medicare Other | Admitting: Internal Medicine

## 2012-09-30 ENCOUNTER — Encounter: Payer: Self-pay | Admitting: Internal Medicine

## 2012-09-30 VITALS — BP 92/70 | HR 109 | Temp 98.1°F | Ht 65.0 in | Wt 108.8 lb

## 2012-09-30 DIAGNOSIS — I509 Heart failure, unspecified: Secondary | ICD-10-CM

## 2012-09-30 DIAGNOSIS — C50919 Malignant neoplasm of unspecified site of unspecified female breast: Secondary | ICD-10-CM

## 2012-09-30 DIAGNOSIS — F329 Major depressive disorder, single episode, unspecified: Secondary | ICD-10-CM

## 2012-09-30 DIAGNOSIS — R197 Diarrhea, unspecified: Secondary | ICD-10-CM | POA: Insufficient documentation

## 2012-09-30 DIAGNOSIS — C50912 Malignant neoplasm of unspecified site of left female breast: Secondary | ICD-10-CM

## 2012-09-30 DIAGNOSIS — R296 Repeated falls: Secondary | ICD-10-CM

## 2012-09-30 DIAGNOSIS — Z9181 History of falling: Secondary | ICD-10-CM

## 2012-09-30 DIAGNOSIS — R42 Dizziness and giddiness: Secondary | ICD-10-CM

## 2012-09-30 DIAGNOSIS — R350 Frequency of micturition: Secondary | ICD-10-CM

## 2012-09-30 DIAGNOSIS — R269 Unspecified abnormalities of gait and mobility: Secondary | ICD-10-CM

## 2012-09-30 LAB — HEPATIC FUNCTION PANEL
ALT: 17 U/L (ref 0–35)
AST: 22 U/L (ref 0–37)
Albumin: 4.4 g/dL (ref 3.5–5.2)
Alkaline Phosphatase: 62 U/L (ref 39–117)
Bilirubin, Direct: 0.1 mg/dL (ref 0.0–0.3)
Total Protein: 7.4 g/dL (ref 6.0–8.3)

## 2012-09-30 LAB — URINALYSIS, ROUTINE W REFLEX MICROSCOPIC
Ketones, ur: NEGATIVE
Nitrite: NEGATIVE
Specific Gravity, Urine: 1.01 (ref 1.000–1.030)
Urobilinogen, UA: 0.2 (ref 0.0–1.0)
pH: 8 (ref 5.0–8.0)

## 2012-09-30 LAB — BASIC METABOLIC PANEL
CO2: 31 mEq/L (ref 19–32)
Calcium: 10 mg/dL (ref 8.4–10.5)
Chloride: 99 mEq/L (ref 96–112)
Glucose, Bld: 90 mg/dL (ref 70–99)
Potassium: 3.6 mEq/L (ref 3.5–5.1)
Sodium: 137 mEq/L (ref 135–145)

## 2012-09-30 LAB — CBC WITH DIFFERENTIAL/PLATELET
Basophils Absolute: 0 10*3/uL (ref 0.0–0.1)
Eosinophils Absolute: 0 10*3/uL (ref 0.0–0.7)
Lymphocytes Relative: 13.6 % (ref 12.0–46.0)
MCHC: 33 g/dL (ref 30.0–36.0)
Monocytes Relative: 11 % (ref 3.0–12.0)
Neutrophils Relative %: 74.5 % (ref 43.0–77.0)
Platelets: 271 10*3/uL (ref 150.0–400.0)
RDW: 13.5 % (ref 11.5–14.6)

## 2012-09-30 MED ORDER — FLUTICASONE PROPIONATE 50 MCG/ACT NA SUSP: 2.0000 | Freq: Every day | NASAL | Status: DC

## 2012-09-30 MED ORDER — CEPHALEXIN 500 MG PO CAPS: 500.0000 mg | ORAL_CAPSULE | Freq: Four times a day (QID) | ORAL | Status: DC

## 2012-09-30 NOTE — Assessment & Plan Note (Signed)
Most recent with slide off cushion of chair with sitting down - contusion small without laceration to mid/left occiuput area, no neuro sequealae,, ok to follow, may be nearing the time she cannot further live alone, should consider at least an independent living place; orthostatics noted, ok refer for home PT

## 2012-09-30 NOTE — Assessment & Plan Note (Signed)
ECG reviewed as per emr, for labs as above, orthostatics noted

## 2012-09-30 NOTE — Assessment & Plan Note (Signed)
?   Mild UTI - for empiric antibx, urine studies

## 2012-09-30 NOTE — Assessment & Plan Note (Signed)
Lost to f/u - will refer oncology, due for f/u mammogram as well

## 2012-09-30 NOTE — Assessment & Plan Note (Signed)
Mild situational increase, verified nonsuicidal, declines further tx at this time or counseling

## 2012-09-30 NOTE — Progress Notes (Signed)
Subjective:    Patient ID: Jean Mitchell, female    DOB: [birth year], 79 y.o.   MRN: [id number]  HPI  Here to f/u with 3 wks onset general weakness, shakiness, and recurrent daily last wk mult episodes loose/watery stools with crampy pains to bilat abdominal worse to lie down, stretching seemed to make better,  and actually last episode abnormal x 2days ago, more recent has been more normal stool,  no BRB or black stools, n/v.  May have a fever at the onset, but none since then. Pt denies chest pain, increased sob or doe, wheezing, orthopnea, PND, increased LE swelling, palpitations,or syncope, but has been dizzy/lightheaded intermittent, actually fell out of chair 4 days ago when cushion slid off the chair and she went with it on trying to sit down, struck mid occipital area post head  With some swelling at the site, no bleeding, no syncope, no confusion though has had some headache.  Has not been taking her lasix since her leg swelling went away and thought it might be too much for the past month. Has had some increased situational depression in the past wk as her dog died and lives alone, and ill recently as above.  Also with recent wk or so of urinary freq with small amounts, but no blood, pain, urgency, incontinence.  Pt quite fierce in her independent status.  A nurse friend drove her here, and is willling for her to stay in her home next door for the next few days Past Medical History  Diagnosis Date  . Breast cancer 2007    lumpectomy, XRT previously on Tamoxifen   . Hyperlipidemia   . CHF (congestive heart failure)   . COPD (chronic obstructive pulmonary disease)     o2 at night  . Depression   . AAA (abdominal aortic aneurysm)   . Back pain     lumbar, chronic  . Hypertension   . Osteoporosis     dexa 02-18-08  . Insomnia   . Esophageal stricture   . Adenomatous colon polyp   . Hip fracture 08/2009    R, s/p ORIF  . Arthritis   . Ulcer   . Heart disease   . Diverticulosis   .  Anxiety 02/15/2011  . Impaired glucose tolerance 02/18/2011  . Internal hemorrhoids   . 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 Presbyesophagus   . Gastritis   . Tubular adenoma   . Breast cancer    Past Surgical History  Procedure Laterality Date  . Oophorectomy      unilateral  . Knee arthroscopy      right   . Hemorrhoid surgery  1965  . Mastectomy, partial  2007    left  . Abdominal hysterectomy  1968  . Tonsillectomy  1968  . Rotator cuff repair    . Anal carcinoma resection    . Kyphosis surgery      balloon  . Cesarean section      x 2  . Hip fracture surgery  2011    right with plate with persistent weakness of the RLE  . Femur im nail  03/17/2012    Procedure: INTRAMEDULLARY (IM) NAIL FEMORAL;  Surgeon: Shelda Pal, MD;  Location: [facility];  Service: Orthopedics;  Laterality: Left;    reports that she has been smoking Cigarettes.  She has a 15 pack-year smoking history. She has never used smokeless tobacco. She reports that she does not drink alcohol or use illicit drugs. family  history includes Breast cancer in her sister; Cancer in her mother; Colon cancer (age of onset: 87) in her father; Colon polyps in her sister; Heart disease in her mother and sister. Allergies  Allergen Reactions  . Gabapentin Hives    whelps  . Ezetimibe-Simvastatin   . Percocet [Oxycodone-Acetaminophen] Nausea And Vomiting    5/325 mg   Current Outpatient Prescriptions on File Prior to Visit  Medication Sig Dispense Refill  . albuterol (PROVENTIL HFA;VENTOLIN HFA) 108 (90 BASE) MCG/ACT inhaler Inhale 2 puffs into the lungs every 6 (six) hours as needed. For shortness of breath.      Marland Kitchen albuterol (PROVENTIL) (5 MG/ML) 0.5% nebulizer solution Take 0.5 mLs (2.5 mg total) by nebulization 3 (three) times daily.  20 mL    . chlordiazePOXIDE (LIBRIUM) 5 MG capsule Take 1-2 capsules (5-10 mg total) by mouth 3 (three) times daily as needed for anxiety. Take 1 capsule by mouth every morning, 1 capsule  in the afternoon, and 2 capsules every evening as needed.  60 capsule  1  . citalopram (CELEXA) 20 MG tablet Take 1 and 1/2 tabs by by per day by mouth  135 tablet  3  . clonazePAM (KLONOPIN) 0.5 MG tablet Take 1 tablet (0.5 mg total) by mouth 2 (two) times daily as needed for anxiety.  60 tablet  2  . docusate sodium 100 MG CAPS Take 100 mg by mouth 2 (two) times daily.  10 capsule    . ferrous sulfate 325 (65 FE) MG tablet Take 1 tablet (325 mg total) by mouth 3 (three) times daily after meals.  42 tablet  0  . fluticasone (FLONASE) 50 MCG/ACT nasal spray Place 2 sprays into the nose daily.  16 g  6  . furosemide (LASIX) 20 MG tablet Take 20 mg by mouth daily.       Marland Kitchen HYDROcodone-acetaminophen (NORCO/VICODIN) 5-325 MG per tablet TAKE 1 TO 2 TABLETS BY MOUTH EVERY 6 HOURS AS NEEDED FOR PAIN. LIMIT 6 TABLETS PER DAY.  120 tablet  1  . HYDROmorphone (DILAUDID) 2 MG tablet Take 1 tablet (2 mg total) by mouth every 4 (four) hours as needed for pain.  20 tablet  0  . pravastatin (PRAVACHOL) 40 MG tablet Take 1 tablet (40 mg total) by mouth daily.  30 tablet  11  . sertraline (ZOLOFT) 100 MG tablet Take 1 tablet (100 mg total) by mouth daily.  90 tablet  3  . tiZANidine (ZANAFLEX) 4 MG tablet Take 1 tablet (4 mg total) by mouth every 6 (six) hours as needed (spasms).  30 tablet  6   No current facility-administered medications on file prior to visit.    Has been lost to f/u for breast ca x approx 3 yrs. No recent mammograms. Review of Systems  Constitutional: Negative for unexpected weight change, or unusual diaphoresis  HENT: Negative for tinnitus.   Eyes: Negative for photophobia and visual disturbance.  Respiratory: Negative for choking and stridor.   Gastrointestinal: Negative for vomiting and blood in stool.  Genitourinary: Negative for hematuria and decreased urine volume.  Musculoskeletal: Negative for acute joint swelling Skin: Negative for color change and wound.  Neurological: Negative  for tremors and numbness other than noted  Psychiatric/Behavioral: Negative for decreased concentration or  hyperactivity.       Objective:   Physical Exam BP 104/70  Pulse 109  Temp(Src) 98.1 F (36.7 C) (Oral)  Ht 5\' 5"  (1.651 m)  Wt 108 lb 12 oz (49.329 kg)  BMI 18.1 kg/m2  SpO2 93%; orthostatic BP noted on chart VS noted, shaky and less steady walking slowly with cane than before, feels warm without fever today  Constitutional: Pt appears well-developed and well-nourished.  HENT: Head: NCAT.  Right Ear: External ear normal.  Left Ear: External ear normal.  Bilat tm's with mild erythema.  Max sinus areas non tender.  Pharynx with mild erythema, no exudate Eyes: Conjunctivae and EOM are normal. Pupils are equal, round, and reactive to light.  Neck: Normal range of motion. Neck supple.  Cardiovascular: Normal rate and regular rhythm.   Pulmonary/Chest: Effort normal and breath sounds normal.  Abd:  Soft, NT, non-distended, + BS Neurological: Pt is alert. Not confused , motor 5/5, shaky with moveement but no tremor Skin: Skin is warm. No erythema.  No LE edema, no rash  Psychiatric: Pt behavior is normal. Thought content normal.     Assessment & Plan:

## 2012-09-30 NOTE — Telephone Encounter (Signed)
No, last rx July 28 was for bid prn

## 2012-09-30 NOTE — Telephone Encounter (Signed)
Pharmacy requesting to refill Clonazepam, note from pharmacy stating the patient has increased directions to one tablet 3 times a day?

## 2012-09-30 NOTE — Assessment & Plan Note (Signed)
?   Viral illness, doubt c diff, seems to have largely resolved, but likely with mild volume depletion despite stopping her lasix for the past month,

## 2012-09-30 NOTE — Assessment & Plan Note (Addendum)
Ok to cont hold lasix for now, f/u 2 wks  Note:  Total time for pt hx, exam, review of record with pt in the room, determination of diagnoses and plan for further eval and tx is > 40 min, with over 50% spent in coordination and counseling of patient

## 2012-09-30 NOTE — Telephone Encounter (Signed)
Pharmacy informed.

## 2012-09-30 NOTE — Patient Instructions (Addendum)
Please call [facility] on [address] [place] for a mammogram You will be contacted regarding the referral for: Oncology Please continue to hold the lasix as you have been doing Please continue all other medications as before, and refills have been done if requested. Please have the pharmacy call with any other refills you may need. Please drink more fluids in the next few days Please take all new medication as prescribed - the antibiotic for possible urinary tract infection Please go to the LAB in the Basement (turn left off the elevator) for the tests to be done today Please go to the XRAY Department in the Basement (go straight as you get off the elevator) for the x-ray testing You will be contacted by phone if any changes need to be made immediately.  Otherwise, you will receive a letter about your results with an explanation, but please check with MyChart first.  You will be contacted regarding the referral for: Home PT  Please return in 2 weeks or sooner if needed  You should stay with your friend (nurse) home for at least the next 2 days

## 2012-10-01 ENCOUNTER — Telehealth: Payer: Self-pay | Admitting: *Deleted

## 2012-10-01 LAB — URINE CULTURE
Colony Count: NO GROWTH
Organism ID, Bacteria: NO GROWTH

## 2012-10-01 NOTE — Telephone Encounter (Signed)
Confirmed appt date and time for pt to see Dr. Darnelle Catalan on 10/21/12 at 9:00.  Mailed out packet and letter. Pt denies further needs at this time. Contact information given.

## 2012-10-07 ENCOUNTER — Telehealth: Payer: Self-pay | Admitting: Internal Medicine

## 2012-10-07 NOTE — Telephone Encounter (Signed)
Patient informed of MD instructions. 

## 2012-10-07 NOTE — Telephone Encounter (Signed)
Ok for dulcolox oral or suppository x 1

## 2012-10-07 NOTE — Telephone Encounter (Signed)
Pt was treated for dehydration.  She had a UTI.  She hasn't had a BM in 5 day.  She wants to know what she can use other than milk of magnesia.

## 2012-10-10 ENCOUNTER — Telehealth: Payer: Self-pay | Admitting: *Deleted

## 2012-10-10 NOTE — Telephone Encounter (Signed)
Pt called stating she has an appt on 10/21/12 with her orthopedic doctor and needs to change her appt with Dr. Darnelle Catalan.  She wants to r/s to October.  Appt moved to 11/28/12 to 9:00.  Confirmed appt date and time.  Pt denies needs.  Pt relayed she received the intake form and was "not going to fill it out" and "that it was too much paperwork".  Told pt I understood and to please call with further questions or concerns.

## 2012-10-15 ENCOUNTER — Encounter: Payer: Self-pay | Admitting: Internal Medicine

## 2012-10-15 ENCOUNTER — Ambulatory Visit (INDEPENDENT_AMBULATORY_CARE_PROVIDER_SITE_OTHER): Payer: Medicare Other | Admitting: Internal Medicine

## 2012-10-15 VITALS — BP 116/70 | HR 83 | Temp 97.6°F | Ht 65.5 in | Wt 110.0 lb

## 2012-10-15 DIAGNOSIS — I509 Heart failure, unspecified: Secondary | ICD-10-CM

## 2012-10-15 DIAGNOSIS — I10 Essential (primary) hypertension: Secondary | ICD-10-CM

## 2012-10-15 DIAGNOSIS — R197 Diarrhea, unspecified: Secondary | ICD-10-CM

## 2012-10-15 DIAGNOSIS — Z23 Encounter for immunization: Secondary | ICD-10-CM

## 2012-10-15 MED ORDER — FUROSEMIDE 20 MG PO TABS: 20.0000 mg | ORAL_TABLET | Freq: Every day | ORAL | Status: DC

## 2012-10-15 NOTE — Assessment & Plan Note (Signed)
stable overall by history and exam, recent data reviewed with pt, and pt to continue medical treatment as before,  to f/u any worsening symptoms or concerns . BP Readings from Last 3 Encounters:  10/15/12 116/70  09/30/12 92/70  08/14/12 97/55

## 2012-10-15 NOTE — Assessment & Plan Note (Signed)
To check daily wts, current here is 110, adised to start lasix for wt gain > 5 lbs (115), Please continue all other medications as before,

## 2012-10-15 NOTE — Patient Instructions (Addendum)
Please check your weight at home, this would be your new "dry weight."  OK to hold off on taking the lasix for now, but if your weight goes up 5 lbs, or you see swelling start to come back in the legs (usually later in the day) you should re-start the lasix at 20 mg per day (as before)  Please continue all other medications as before, and refills have been done if requested.  Please keep your appointments with your specialists as you have planned - Dr Darnelle Catalan

## 2012-10-15 NOTE — Assessment & Plan Note (Signed)
?   Viral illness - now resolved,  to f/u any worsening symptoms or concerns

## 2012-10-15 NOTE — Progress Notes (Signed)
Subjective:    Patient ID: Jean Mitchell, female    DOB: Jun 25, 1927, 79 y.o.   MRN: [id number]  HPI  Here to f/u, overall doing much better with stamina and gait, refused PT but walking with cane today briskly for age, no recent falls or worsening pain otherwise.    No diarrhea,  Not taking the lasix now, wt up 2 lbs from last visit. Urine cx and labs/cxr proved essentially negativve for acute, incluiding wbc.  No acute or new complaints Past Medical History  Diagnosis Date  . Breast cancer 2007    lumpectomy, XRT previously on Tamoxifen   . Hyperlipidemia   . CHF (congestive heart failure)   . COPD (chronic obstructive pulmonary disease)     o2 at night  . Depression   . AAA (abdominal aortic aneurysm)   . Back pain     lumbar, chronic  . Hypertension   . Osteoporosis     dexa 02-18-08  . Insomnia   . Esophageal stricture   . Adenomatous colon polyp   . Hip fracture 08/2009    R, s/p ORIF  . Arthritis   . Ulcer   . Heart disease   . Diverticulosis   . Anxiety 02/15/2011  . Impaired glucose tolerance 02/18/2011  . Internal hemorrhoids   . 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 Presbyesophagus   . Gastritis   . Tubular adenoma   . Breast cancer    Past Surgical History  Procedure Laterality Date  . Oophorectomy      unilateral  . Knee arthroscopy      right   . Hemorrhoid surgery  1965  . Mastectomy, partial  2007    left  . Abdominal hysterectomy  1968  . Tonsillectomy  1968  . Rotator cuff repair    . Anal carcinoma resection    . Kyphosis surgery      balloon  . Cesarean section      x 2  . Hip fracture surgery  2011    right with plate with persistent weakness of the RLE  . Femur im nail  03/17/2012    Procedure: INTRAMEDULLARY (IM) NAIL FEMORAL;  Surgeon: Shelda Pal, MD;  Location: [facility];  Service: Orthopedics;  Laterality: Left;    reports that she has been smoking Cigarettes.  She has a 15 pack-year smoking history. She has never used smokeless tobacco.  She reports that she does not drink alcohol or use illicit drugs. family history includes Breast cancer in her sister; Cancer in her mother; Colon cancer (age of onset: 94) in her father; Colon polyps in her sister; Heart disease in her mother and sister. Allergies  Allergen Reactions  . Gabapentin Hives    whelps  . Ezetimibe-Simvastatin   . Percocet [Oxycodone-Acetaminophen] Nausea And Vomiting    5/325 mg   Current Outpatient Prescriptions on File Prior to Visit  Medication Sig Dispense Refill  . albuterol (PROVENTIL HFA;VENTOLIN HFA) 108 (90 BASE) MCG/ACT inhaler Inhale 2 puffs into the lungs every 6 (six) hours as needed. For shortness of breath.      Marland Kitchen albuterol (PROVENTIL) (5 MG/ML) 0.5% nebulizer solution Take 0.5 mLs (2.5 mg total) by nebulization 3 (three) times daily.  20 mL    . cephALEXin (KEFLEX) 500 MG capsule Take 1 capsule (500 mg total) by mouth 4 (four) times daily.  40 capsule  0  . chlordiazePOXIDE (LIBRIUM) 5 MG capsule Take 1-2 capsules (5-10 mg total) by mouth 3 (  three) times daily as needed for anxiety. Take 1 capsule by mouth every morning, 1 capsule in the afternoon, and 2 capsules every evening as needed.  60 capsule  1  . citalopram (CELEXA) 20 MG tablet Take 1 and 1/2 tabs by by per day by mouth  135 tablet  3  . clonazePAM (KLONOPIN) 0.5 MG tablet Take 1 tablet (0.5 mg total) by mouth 2 (two) times daily as needed for anxiety.  60 tablet  2  . docusate sodium 100 MG CAPS Take 100 mg by mouth 2 (two) times daily.  10 capsule    . ferrous sulfate 325 (65 FE) MG tablet Take 1 tablet (325 mg total) by mouth 3 (three) times daily after meals.  42 tablet  0  . fluticasone (FLONASE) 50 MCG/ACT nasal spray Place 2 sprays into the nose daily.  16 g  6  . HYDROcodone-acetaminophen (NORCO/VICODIN) 5-325 MG per tablet TAKE 1 TO 2 TABLETS BY MOUTH EVERY 6 HOURS AS NEEDED FOR PAIN. LIMIT 6 TABLETS PER DAY.  120 tablet  1  . HYDROmorphone (DILAUDID) 2 MG tablet Take 1 tablet (2  mg total) by mouth every 4 (four) hours as needed for pain.  20 tablet  0  . pravastatin (PRAVACHOL) 40 MG tablet Take 1 tablet (40 mg total) by mouth daily.  30 tablet  11  . sertraline (ZOLOFT) 100 MG tablet Take 1 tablet (100 mg total) by mouth daily.  90 tablet  3  . tiZANidine (ZANAFLEX) 4 MG tablet Take 1 tablet (4 mg total) by mouth every 6 (six) hours as needed (spasms).  30 tablet  6   No current facility-administered medications on file prior to visit.   Review of Systems  Constitutional: Negative for unexpected weight change, or unusual diaphoresis  HENT: Negative for tinnitus.   Eyes: Negative for photophobia and visual disturbance.  Respiratory: Negative for choking and stridor.   Gastrointestinal: Negative for vomiting and blood in stool.  Genitourinary: Negative for hematuria and decreased urine volume.  Musculoskeletal: Negative for acute joint swelling Skin: Negative for color change and wound.  Neurological: Negative for tremors and numbness other than noted  Psychiatric/Behavioral: Negative for decreased concentration or  hyperactivity.       Objective:   Physical Exam BP 116/70  Pulse 83  Temp(Src) 97.6 F (36.4 C) (Oral)  Ht 5' 5.5" (1.664 m)  Wt 110 lb (49.896 kg)  BMI 18.02 kg/m2  SpO2 92% VS noted, not ill appaering, walks with cane (often not at home though) Constitutional: Pt appears well-developed and well-nourished.  HENT: Head: NCAT.  Right Ear: External ear normal.  Left Ear: External ear normal.  Eyes: Conjunctivae and EOM are normal. Pupils are equal, round, and reactive to light.  Neck: Normal range of motion. Neck supple.  Cardiovascular: Normal rate and regular rhythm.   Pulmonary/Chest: Effort normal and breath sounds normal.  Abd:  Soft, NT, non-distended, + BS Neurological: Pt is alert. Not confused , motor 5/5, gait much more steady, doesnot appear unbalanced, gets up on exam table with little assist Skin: Skin is warm. No erythema.   Psychiatric: Pt behavior is normal. Thought content normal.     Assessment & Plan:

## 2012-10-20 ENCOUNTER — Telehealth: Payer: Self-pay | Admitting: *Deleted

## 2012-10-20 NOTE — Telephone Encounter (Signed)
Pt called stating that she is sick and cannot make her appt tomorrow.  She will call back when she feels better to reschedule.  I cancelled appts as pt requested.

## 2012-10-21 ENCOUNTER — Other Ambulatory Visit: Payer: Medicare Other | Admitting: Lab

## 2012-10-21 ENCOUNTER — Ambulatory Visit: Payer: Medicare Other

## 2012-10-21 ENCOUNTER — Ambulatory Visit: Payer: Medicare Other | Admitting: Oncology

## 2012-10-24 ENCOUNTER — Other Ambulatory Visit: Payer: Self-pay | Admitting: Internal Medicine

## 2012-10-29 ENCOUNTER — Other Ambulatory Visit: Payer: Self-pay

## 2012-10-29 MED ORDER — ALBUTEROL SULFATE (5 MG/ML) 0.5% IN NEBU: 2.5000 mg | INHALATION_SOLUTION | Freq: Three times a day (TID) | RESPIRATORY_TRACT | Status: DC

## 2012-10-31 ENCOUNTER — Telehealth: Payer: Self-pay | Admitting: Internal Medicine

## 2012-10-31 MED ORDER — ALBUTEROL SULFATE HFA 108 (90 BASE) MCG/ACT IN AERS: 2.0000 | INHALATION_SPRAY | Freq: Four times a day (QID) | RESPIRATORY_TRACT | Status: DC | PRN

## 2012-10-31 NOTE — Telephone Encounter (Signed)
Please resend refill for albuterol to pt's drug store. The drug store stated that they do not have the refill that we sent in. Please advise.

## 2012-10-31 NOTE — Telephone Encounter (Signed)
rx resent to [facility]

## 2012-11-03 ENCOUNTER — Ambulatory Visit: Payer: Medicare Other | Admitting: Internal Medicine

## 2012-11-04 ENCOUNTER — Encounter: Payer: Self-pay | Admitting: Internal Medicine

## 2012-11-04 ENCOUNTER — Ambulatory Visit: Payer: Medicare Other | Admitting: Internal Medicine

## 2012-11-04 ENCOUNTER — Ambulatory Visit (INDEPENDENT_AMBULATORY_CARE_PROVIDER_SITE_OTHER): Payer: Medicare Other | Admitting: Internal Medicine

## 2012-11-04 VITALS — BP 112/78 | HR 115 | Temp 97.9°F | Wt 103.8 lb

## 2012-11-04 DIAGNOSIS — G8929 Other chronic pain: Secondary | ICD-10-CM

## 2012-11-04 DIAGNOSIS — F411 Generalized anxiety disorder: Secondary | ICD-10-CM

## 2012-11-04 DIAGNOSIS — F419 Anxiety disorder, unspecified: Secondary | ICD-10-CM

## 2012-11-04 DIAGNOSIS — R531 Weakness: Secondary | ICD-10-CM

## 2012-11-04 DIAGNOSIS — I10 Essential (primary) hypertension: Secondary | ICD-10-CM

## 2012-11-04 DIAGNOSIS — R5381 Other malaise: Secondary | ICD-10-CM

## 2012-11-04 MED ORDER — CLONAZEPAM 0.5 MG PO TABS: 0.5000 mg | ORAL_TABLET | Freq: Two times a day (BID) | ORAL | Status: DC | PRN

## 2012-11-04 MED ORDER — ALBUTEROL SULFATE HFA 108 (90 BASE) MCG/ACT IN AERS: 2.0000 | INHALATION_SPRAY | Freq: Four times a day (QID) | RESPIRATORY_TRACT | Status: AC | PRN

## 2012-11-04 MED ORDER — SERTRALINE HCL 100 MG PO TABS: 100.0000 mg | ORAL_TABLET | Freq: Every day | ORAL | Status: DC

## 2012-11-04 MED ORDER — CHLORDIAZEPOXIDE HCL 5 MG PO CAPS: 5.0000 mg | ORAL_CAPSULE | Freq: Three times a day (TID) | ORAL | Status: DC | PRN

## 2012-11-04 MED ORDER — HYDROCODONE-ACETAMINOPHEN 5-325 MG PO TABS: ORAL_TABLET | ORAL | Status: DC

## 2012-11-04 NOTE — Patient Instructions (Signed)
OK to continue the Librium (chlordiazepoxide) but we have to stop the klonopin b/c you wouldn't want to take both (they are too similar) OK to stop the citalopram for the same reason Please continue all other medications as before, and refills have been done if requested, including the generic Zoloft, pain medication, and the librium Please call if you change your mind about the neurology or Physical Therapy  Please remember to sign up for My Chart if you have not done so, as this will be important to you in the future with finding out test results, communicating by private email, and scheduling acute appointments online when needed.

## 2012-11-04 NOTE — Assessment & Plan Note (Addendum)
prob a large component of her exam today, ok for off the celexa, take the zoloft only, as well as librium (no need for the klonopin with this), declines referral to psychiatry Lab Results  Component Value Date   WBC 6.9 09/30/2012   HGB 12.6 09/30/2012   HCT 38.3 09/30/2012   PLT 271.0 09/30/2012   GLUCOSE 90 09/30/2012   CHOL 179 06/29/2010   TRIG 52.0 06/29/2010   HDL 95.30 06/29/2010   LDLDIRECT 88.2 11/21/2009   LDLCALC 73 06/29/2010   ALT 17 09/30/2012   AST 22 09/30/2012   NA 137 09/30/2012   K 3.6 09/30/2012   CL 99 09/30/2012   CREATININE 0.7 09/30/2012   BUN 10 09/30/2012   CO2 31 09/30/2012   TSH 1.61 02/27/2011   INR 0.88 03/17/2012   HGBA1C 5.5 10/22/2011   Note:  Total time for pt hx, exam, review of record with pt in the room, determination of diagnoses and plan for further eval and tx is > 40 min, with over 50% spent in coordination and counseling of patient

## 2012-11-04 NOTE — Assessment & Plan Note (Signed)
Ok for refill pain med,  to f/u any worsening symptoms or concerns  

## 2012-11-04 NOTE — Assessment & Plan Note (Signed)
stable overall by history and exam, recent data reviewed with pt, and pt to continue medical treatment as before,  to f/u any worsening symptoms or concerns BP Readings from Last 3 Encounters:  11/04/12 112/78  10/15/12 116/70  09/30/12 92/70

## 2012-11-04 NOTE — Progress Notes (Addendum)
Subjective:    Patient ID: Jean Mitchell, female    DOB: [birth year], 79 y.o.   MRN: [id number]  HPI  Here to f/u with son, c/o general weakness, similar to presentation last mo with mult labs and urine cx neg at that time.  Has lost a few more lbs, just doesn't have much of an appetite.  Denies worsening depressive symptoms, suicidal ideation, or panic; has ongoing anxiety, prob increased recently though cannot identify any worsening stressors.  Out of librium, states has no refills, need pain med refill as well.  Pt denies chest pain, increased sob or doe, wheezing, orthopnea, PND, increased LE swelling, palpitations, dizziness or syncope. Pt denies new neurological symptoms such as new headache, or facial or extremity weakness or numbness, though is shaky and tremulous at times, has difficultly walking and balance is an issue, but also has pain to left hip and back area s/p cortisone 1 wk ago, with f/u scheduled soon.  Also has ongoing left knee arthritic pain and mild sweling, but no giveways, no recent falls, does not want referral for this.  Denies urinary symptoms such as dysuria, frequency, urgency, flank pain, hematuria or n/v, fever, chills.   Pt denies fever, night sweats, or other constitutional symptoms except for lower appetitie.  Nonsmoker.  Did have 2 days recent diarrheal stools last wk, but now resolved and Denies worsening reflux, abd pain, dysphagia, n/v, bowel change or blood.  Has had much PT in the past, states does not want more. Past Medical History  Diagnosis Date  . Breast cancer 2007    lumpectomy, XRT previously on Tamoxifen   . Hyperlipidemia   . CHF (congestive heart failure)   . COPD (chronic obstructive pulmonary disease)     o2 at night  . Depression   . AAA (abdominal aortic aneurysm)   . Back pain     lumbar, chronic  . Hypertension   . Osteoporosis     dexa 02-18-08  . Insomnia   . Esophageal stricture   . Adenomatous colon polyp   . Hip fracture 08/2009    R,  s/p ORIF  . Arthritis   . Ulcer   . Heart disease   . Diverticulosis   . Anxiety 02/15/2011  . Impaired glucose tolerance 02/18/2011  . Internal hemorrhoids   . 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 Presbyesophagus   . Gastritis   . Tubular adenoma   . Breast cancer    Past Surgical History  Procedure Laterality Date  . Oophorectomy      unilateral  . Knee arthroscopy      right   . Hemorrhoid surgery  1965  . Mastectomy, partial  2007    left  . Abdominal hysterectomy  1968  . Tonsillectomy  1968  . Rotator cuff repair    . Anal carcinoma resection    . Kyphosis surgery      balloon  . Cesarean section      x 2  . Hip fracture surgery  2011    right with plate with persistent weakness of the RLE  . Femur im nail  03/17/2012    Procedure: INTRAMEDULLARY (IM) NAIL FEMORAL;  Surgeon: Shelda Pal, MD;  Location: [facility];  Service: Orthopedics;  Laterality: Left;    reports that she has been smoking Cigarettes.  She has a 15 pack-year smoking history. She has never used smokeless tobacco. She reports that she does not drink alcohol or use illicit drugs. family  history includes Breast cancer in her sister; Cancer in her mother; Colon cancer (age of onset: 47) in her father; Colon polyps in her sister; Heart disease in her mother and sister. Allergies  Allergen Reactions  . Gabapentin Hives    whelps  . Ezetimibe-Simvastatin   . Percocet [Oxycodone-Acetaminophen] Nausea And Vomiting    5/325 mg   Current Outpatient Prescriptions on File Prior to Visit  Medication Sig Dispense Refill  . ADVAIR DISKUS 100-50 MCG/DOSE AEPB INHALE 1 PUFF INTO THE LUNGS EVERY 12 HOURS.  60 each  11  . albuterol (PROVENTIL HFA;VENTOLIN HFA) 108 (90 BASE) MCG/ACT inhaler Inhale 2 puffs into the lungs every 6 (six) hours as needed. For shortness of breath.  1 Inhaler  11  . albuterol (PROVENTIL) (5 MG/ML) 0.5% nebulizer solution Take 0.5 mLs (2.5 mg total) by nebulization 3 (three) times daily.  20  mL  11  . docusate sodium 100 MG CAPS Take 100 mg by mouth 2 (two) times daily.  10 capsule    . ferrous sulfate 325 (65 FE) MG tablet Take 1 tablet (325 mg total) by mouth 3 (three) times daily after meals.  42 tablet  0  . fluticasone (FLONASE) 50 MCG/ACT nasal spray Place 2 sprays into the nose daily.  16 g  6  . HYDROmorphone (DILAUDID) 2 MG tablet Take 1 tablet (2 mg total) by mouth every 4 (four) hours as needed for pain.  20 tablet  0  . pravastatin (PRAVACHOL) 40 MG tablet Take 1 tablet (40 mg total) by mouth daily.  30 tablet  11  . tiZANidine (ZANAFLEX) 4 MG tablet Take 1 tablet (4 mg total) by mouth every 6 (six) hours as needed (spasms).  30 tablet  6   No current facility-administered medications on file prior to visit.   Review of Systems  Constitutional: Negative for unexpected weight change, or unusual diaphoresis  HENT: Negative for tinnitus.   Eyes: Negative for photophobia and visual disturbance.  Respiratory: Negative for choking and stridor.   Gastrointestinal: Negative for vomiting and blood in stool.  Genitourinary: Negative for hematuria and decreased urine volume.  Musculoskeletal: Negative for acute joint swelling Skin: Negative for color change and wound.  Neurological: Negative for tremors and numbness other than noted  Psychiatric/Behavioral: Negative for decreased concentration or  hyperactivity.       Objective:   Physical Exam BP 112/78  Pulse 115  Temp(Src) 97.9 F (36.6 C) (Oral)  Wt 103 lb 12 oz (47.061 kg)  BMI 17 kg/m2  SpO2 91% VS noted, frail, very thin  HENT: Head: NCAT.  Right Ear: External ear normal.  Left Ear: External ear normal.  Eyes: Conjunctivae and EOM are normal. Pupils are equal, round, and reactive to light.  Neck: Normal range of motion. Neck supple.  Cardiovascular: Normal rate and regular rhythm.   Pulmonary/Chest: Effort normal and breath sounds decreased, no rales or wheezing.  Abd:  Soft, NT, non-distended, + BS, no  flank tenderness Neurological: Pt is alert. Not confused  Skin: Skin is warm. No erythema.  Psychiatric: Pt behavior is normal. Thought content normal. 2+ nervous Left knee mild warm with trace to 1+ effusion, NT    Assessment & Plan:  Quality Measures addressed:  CHF B-blocker tx for LVSD: pt declines further medication

## 2012-11-04 NOTE — Assessment & Plan Note (Signed)
Declines PT referral, cont ensure and current meds

## 2012-11-28 ENCOUNTER — Ambulatory Visit: Payer: Medicare Other | Admitting: Oncology

## 2012-11-28 ENCOUNTER — Ambulatory Visit: Payer: Medicare Other

## 2012-11-28 ENCOUNTER — Other Ambulatory Visit: Payer: Medicare Other | Admitting: Lab

## 2013-01-09 ENCOUNTER — Telehealth: Payer: Self-pay | Admitting: *Deleted

## 2013-01-09 NOTE — Telephone Encounter (Signed)
Spoke with Jean Mitchell, she wanted to confirm her future appt with Dr. Jonny Ruiz.

## 2013-01-09 NOTE — Telephone Encounter (Signed)
Pt left msg on triage requesting a return call. Called pt back, no answer. lmovm to return call.

## 2013-01-16 ENCOUNTER — Other Ambulatory Visit: Payer: Self-pay

## 2013-01-26 ENCOUNTER — Ambulatory Visit: Payer: Medicare Other | Admitting: Internal Medicine

## 2013-01-27 ENCOUNTER — Encounter: Payer: Self-pay | Admitting: Internal Medicine

## 2013-01-27 ENCOUNTER — Ambulatory Visit (INDEPENDENT_AMBULATORY_CARE_PROVIDER_SITE_OTHER): Payer: Medicare Other | Admitting: Internal Medicine

## 2013-01-27 VITALS — BP 100/62 | HR 79 | Temp 97.8°F | Ht 65.0 in | Wt 105.2 lb

## 2013-01-27 DIAGNOSIS — Z23 Encounter for immunization: Secondary | ICD-10-CM

## 2013-01-27 DIAGNOSIS — F329 Major depressive disorder, single episode, unspecified: Secondary | ICD-10-CM

## 2013-01-27 DIAGNOSIS — R51 Headache: Secondary | ICD-10-CM | POA: Insufficient documentation

## 2013-01-27 DIAGNOSIS — J4489 Other specified chronic obstructive pulmonary disease: Secondary | ICD-10-CM

## 2013-01-27 DIAGNOSIS — R519 Headache, unspecified: Secondary | ICD-10-CM | POA: Insufficient documentation

## 2013-01-27 DIAGNOSIS — J449 Chronic obstructive pulmonary disease, unspecified: Secondary | ICD-10-CM

## 2013-01-27 DIAGNOSIS — I10 Essential (primary) hypertension: Secondary | ICD-10-CM

## 2013-01-27 DIAGNOSIS — F3289 Other specified depressive episodes: Secondary | ICD-10-CM

## 2013-01-27 NOTE — Patient Instructions (Addendum)
Please see [facility] on [address], or [facility] on [address] for your yearly mammogram, or other provider of your choice You had the new Prevnar pneumonia shot today Please continue all other medications as before, and refills have been done if requested. Please have the pharmacy call with any other refills you may need. Please keep your appointments with your specialists as you may have planned  Please remember to sign up for My Chart if you have not done so, as this will be important to you in the future with finding out test results, communicating by private email, and scheduling acute appointments online when needed.  You will be contacted regarding the referral for: MRI head  Please return in 6 months, or sooner if needed

## 2013-01-27 NOTE — Progress Notes (Signed)
Pre-visit discussion using our clinic review tool. No additional management support is needed unless otherwise documented below in the visit note.  

## 2013-01-27 NOTE — Progress Notes (Signed)
Subjective:    Patient ID: Jean Mitchell, female    DOB: October 01, 1927, 79 y.o.   MRN: [id number]  HPI  Here with c/o Headache, now daily for 2 wks without neuro symptoms, and Pt denies new neurological symptoms such as new headache, or facial or extremity weakness or numbness  Sister died with cranial anuerysm, and 4 cousins with other anueyrms as well.  Pt denies chest pain, increased sob or doe, wheezing, orthopnea, PND, increased LE swelling, palpitations, dizziness or syncope.   Pt denies polydipsia, polyuria,.  Pt states overall good compliance with meds, trying to follow lower cholesterol, diabetic diet, wt overall stable, though take ensure occas for calories   Unable to afford spiriva as she is in donut hole, plans to re-start after jan 1.Denies worsening depressive symptoms, suicidal ideation, or panic; has ongoing anxiety, Past Medical History  Diagnosis Date  . Breast cancer 2007    lumpectomy, XRT previously on Tamoxifen   . Hyperlipidemia   . CHF (congestive heart failure)   . COPD (chronic obstructive pulmonary disease)     o2 at night  . Depression   . AAA (abdominal aortic aneurysm)   . Back pain     lumbar, chronic  . Hypertension   . Osteoporosis     dexa 02-18-08  . Insomnia   . Esophageal stricture   . Adenomatous colon polyp   . Hip fracture 08/2009    R, s/p ORIF  . Arthritis   . Ulcer   . Heart disease   . Diverticulosis   . Anxiety 02/15/2011  . Impaired glucose tolerance 02/18/2011  . Internal hemorrhoids   . 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 Presbyesophagus   . Gastritis   . Tubular adenoma   . Breast cancer    Past Surgical History  Procedure Laterality Date  . Oophorectomy      unilateral  . Knee arthroscopy      right   . Hemorrhoid surgery  1965  . Mastectomy, partial  2007    left  . Abdominal hysterectomy  1968  . Tonsillectomy  1968  . Rotator cuff repair    . Anal carcinoma resection    . Kyphosis surgery      balloon  . Cesarean  section      x 2  . Hip fracture surgery  2011    right with plate with persistent weakness of the RLE  . Femur im nail  03/17/2012    Procedure: INTRAMEDULLARY (IM) NAIL FEMORAL;  Surgeon: Shelda Pal, MD;  Location: [facility];  Service: Orthopedics;  Laterality: Left;    reports that she has been smoking Cigarettes.  She has a 15 pack-year smoking history. She has never used smokeless tobacco. She reports that she does not drink alcohol or use illicit drugs. family history includes Breast cancer in her sister; Cancer in her mother; Colon cancer (age of onset: 2) in her father; Colon polyps in her sister; Heart disease in her mother and sister. Allergies  Allergen Reactions  . Gabapentin Hives    whelps  . Ezetimibe-Simvastatin   . Percocet [Oxycodone-Acetaminophen] Nausea And Vomiting    5/325 mg   Current Outpatient Prescriptions on File Prior to Visit  Medication Sig Dispense Refill  . ADVAIR DISKUS 100-50 MCG/DOSE AEPB INHALE 1 PUFF INTO THE LUNGS EVERY 12 HOURS.  60 each  11  . albuterol (PROAIR HFA) 108 (90 BASE) MCG/ACT inhaler Inhale 2 puffs into the lungs every 6 (  six) hours as needed for wheezing.  8.5 g  11  . albuterol (PROVENTIL HFA;VENTOLIN HFA) 108 (90 BASE) MCG/ACT inhaler Inhale 2 puffs into the lungs every 6 (six) hours as needed. For shortness of breath.  1 Inhaler  11  . albuterol (PROVENTIL) (5 MG/ML) 0.5% nebulizer solution Take 0.5 mLs (2.5 mg total) by nebulization 3 (three) times daily.  20 mL  11  . docusate sodium 100 MG CAPS Take 100 mg by mouth 2 (two) times daily.  10 capsule    . ferrous sulfate 325 (65 FE) MG tablet Take 1 tablet (325 mg total) by mouth 3 (three) times daily after meals.  42 tablet  0  . fluticasone (FLONASE) 50 MCG/ACT nasal spray Place 2 sprays into the nose daily.  16 g  6  . pravastatin (PRAVACHOL) 40 MG tablet Take 1 tablet (40 mg total) by mouth daily.  30 tablet  11  . sertraline (ZOLOFT) 100 MG tablet Take 1 tablet (100 mg total) by  mouth daily.  90 tablet  3  . tiZANidine (ZANAFLEX) 4 MG tablet Take 1 tablet (4 mg total) by mouth every 6 (six) hours as needed (spasms).  30 tablet  6   No current facility-administered medications on file prior to visit.    Review of Systems  Constitutional: Negative for unexpected weight change, or unusual diaphoresis  HENT: Negative for tinnitus.   Eyes: Negative for photophobia and visual disturbance.  Respiratory: Negative for choking and stridor.   Gastrointestinal: Negative for vomiting and blood in stool.  Genitourinary: Negative for hematuria and decreased urine volume.  Musculoskeletal: Negative for acute joint swelling Skin: Negative for color change and wound.  Neurological: Negative for tremors and numbness other than noted  Psychiatric/Behavioral: Negative for decreased concentration or  hyperactivity.       Objective:   Physical Exam BP 100/62  Pulse 79  Temp(Src) 97.8 F (36.6 C) (Oral)  Ht 5\' 5"  (1.651 m)  Wt 105 lb 4 oz (47.741 kg)  BMI 17.51 kg/m2  SpO2 90% Ms. VS noted,  Constitutional: Pt appears thin  HENT: Head: NCAT.  Right Ear: External ear normal.  Left Ear: External ear normal.  Eyes: Conjunctivae and EOM are normal. Pupils are equal, round, and reactive to light.  Neck: Normal range of motion. Neck supple.  Cardiovascular: Normal rate and regular rhythm.   Pulmonary/Chest: Effort normal and breath sounds normal.  Abd:  Soft, NT, non-distended, + BS Neurological: Pt is alert. Not confused  Skin: Skin is warm. No erythema.  Psychiatric: Pt behavior is normal. Thought content normal. not depressed affect    Assessment & Plan:

## 2013-01-28 ENCOUNTER — Telehealth: Payer: Self-pay | Admitting: *Deleted

## 2013-01-28 MED ORDER — HYDROCODONE-ACETAMINOPHEN 5-325 MG PO TABS: ORAL_TABLET | ORAL | Status: DC

## 2013-01-28 MED ORDER — CHLORDIAZEPOXIDE HCL 5 MG PO CAPS: 5.0000 mg | ORAL_CAPSULE | Freq: Three times a day (TID) | ORAL | Status: DC | PRN

## 2013-01-28 NOTE — Telephone Encounter (Signed)
Called the patient and she does need Librium refilled and her Hydrocodone.

## 2013-01-28 NOTE — Assessment & Plan Note (Signed)
stable overall by history and exam, recent data reviewed with pt, and pt to continue medical treatment as before,  to f/u any worsening symptoms or concerns Lab Results  Component Value Date   WBC 6.9 09/30/2012   HGB 12.6 09/30/2012   HCT 38.3 09/30/2012   PLT 271.0 09/30/2012   GLUCOSE 90 09/30/2012   CHOL 179 06/29/2010   TRIG 52.0 06/29/2010   HDL 95.30 06/29/2010   LDLDIRECT 88.2 11/21/2009   LDLCALC 73 06/29/2010   ALT 17 09/30/2012   AST 22 09/30/2012   NA 137 09/30/2012   K 3.6 09/30/2012   CL 99 09/30/2012   CREATININE 0.7 09/30/2012   BUN 10 09/30/2012   CO2 31 09/30/2012   TSH 1.61 02/27/2011   INR 0.88 03/17/2012   HGBA1C 5.5 10/22/2011

## 2013-01-28 NOTE — Telephone Encounter (Signed)
Can she remind [place] which ones she needed refilled?  I think one was the librium

## 2013-01-28 NOTE — Assessment & Plan Note (Signed)
stable overall by history and exam, recent data reviewed with pt, and pt to continue medical treatment as before,  to f/u any worsening symptoms or concerns SpO2 Readings from Last 3 Encounters:  01/27/13 90%  11/04/12 91%  10/15/12 92%

## 2013-01-28 NOTE — Telephone Encounter (Signed)
Called the patient left a detailed message to pickup hardcopy's at the front desk as well as letter enclosed explaining change in pain medication refill policy

## 2013-01-28 NOTE — Telephone Encounter (Signed)
Done hardcopy to robin, but also to let pt know -   You are given the letter today explaining the transitional pain medication refill policy due to recent change in [place] law and [place] [employer] regulations  Please be aware that I will no longer be able to offer monthly refills of any Schedule II or higher medication starting Mar 15, 2013

## 2013-01-28 NOTE — Assessment & Plan Note (Signed)
For head MRI,  to f/u any worsening symptoms or concerns

## 2013-01-28 NOTE — Telephone Encounter (Signed)
Pt called states the Rx from yesterdays visit was not sent to pharmacy.  Further states her pharmacy does not deliver on Saturdays.  Please advise

## 2013-01-28 NOTE — Assessment & Plan Note (Signed)
stable overall by history and exam, recent data reviewed with pt, and pt to continue medical treatment as before,  to f/u any worsening symptoms or concerns BP Readings from Last 3 Encounters:  01/27/13 100/62  11/04/12 112/78  10/15/12 116/70

## 2013-02-03 ENCOUNTER — Ambulatory Visit (INDEPENDENT_AMBULATORY_CARE_PROVIDER_SITE_OTHER): Payer: Medicare Other | Admitting: Internal Medicine

## 2013-02-03 ENCOUNTER — Encounter: Payer: Self-pay | Admitting: Internal Medicine

## 2013-02-03 ENCOUNTER — Emergency Department (HOSPITAL_COMMUNITY): Payer: Medicare Other

## 2013-02-03 ENCOUNTER — Emergency Department (HOSPITAL_COMMUNITY)
Admission: EM | Admit: 2013-02-03 | Discharge: 2013-02-03 | Disposition: A | Discharge status: Home / Self Care | Payer: Medicare Other | Attending: Emergency Medicine | Admitting: Emergency Medicine

## 2013-02-03 ENCOUNTER — Encounter (HOSPITAL_COMMUNITY): Payer: Self-pay | Admitting: Emergency Medicine

## 2013-02-03 VITALS — BP 120/68 | HR 103 | Temp 100.1°F | Wt 105.5 lb

## 2013-02-03 DIAGNOSIS — F411 Generalized anxiety disorder: Secondary | ICD-10-CM | POA: Insufficient documentation

## 2013-02-03 DIAGNOSIS — M545 Low back pain, unspecified: Secondary | ICD-10-CM | POA: Insufficient documentation

## 2013-02-03 DIAGNOSIS — Z85828 Personal history of other malignant neoplasm of skin: Secondary | ICD-10-CM | POA: Insufficient documentation

## 2013-02-03 DIAGNOSIS — R0902 Hypoxemia: Secondary | ICD-10-CM | POA: Insufficient documentation

## 2013-02-03 DIAGNOSIS — G8929 Other chronic pain: Secondary | ICD-10-CM | POA: Insufficient documentation

## 2013-02-03 DIAGNOSIS — N390 Urinary tract infection, site not specified: Secondary | ICD-10-CM

## 2013-02-03 DIAGNOSIS — F3289 Other specified depressive episodes: Secondary | ICD-10-CM | POA: Insufficient documentation

## 2013-02-03 DIAGNOSIS — Z9981 Dependence on supplemental oxygen: Secondary | ICD-10-CM | POA: Insufficient documentation

## 2013-02-03 DIAGNOSIS — M129 Arthropathy, unspecified: Secondary | ICD-10-CM | POA: Insufficient documentation

## 2013-02-03 DIAGNOSIS — Z860100 Personal history of colon polyps, unspecified: Secondary | ICD-10-CM | POA: Insufficient documentation

## 2013-02-03 DIAGNOSIS — Z853 Personal history of malignant neoplasm of breast: Secondary | ICD-10-CM | POA: Insufficient documentation

## 2013-02-03 DIAGNOSIS — Z872 Personal history of diseases of the skin and subcutaneous tissue: Secondary | ICD-10-CM | POA: Insufficient documentation

## 2013-02-03 DIAGNOSIS — J441 Chronic obstructive pulmonary disease with (acute) exacerbation: Secondary | ICD-10-CM | POA: Insufficient documentation

## 2013-02-03 DIAGNOSIS — Z8719 Personal history of other diseases of the digestive system: Secondary | ICD-10-CM | POA: Insufficient documentation

## 2013-02-03 DIAGNOSIS — Z792 Long term (current) use of antibiotics: Secondary | ICD-10-CM | POA: Insufficient documentation

## 2013-02-03 DIAGNOSIS — I10 Essential (primary) hypertension: Secondary | ICD-10-CM | POA: Insufficient documentation

## 2013-02-03 DIAGNOSIS — F329 Major depressive disorder, single episode, unspecified: Secondary | ICD-10-CM | POA: Insufficient documentation

## 2013-02-03 DIAGNOSIS — I509 Heart failure, unspecified: Secondary | ICD-10-CM | POA: Insufficient documentation

## 2013-02-03 DIAGNOSIS — F17200 Nicotine dependence, unspecified, uncomplicated: Secondary | ICD-10-CM | POA: Insufficient documentation

## 2013-02-03 DIAGNOSIS — E785 Hyperlipidemia, unspecified: Secondary | ICD-10-CM | POA: Insufficient documentation

## 2013-02-03 DIAGNOSIS — Z8781 Personal history of (healed) traumatic fracture: Secondary | ICD-10-CM | POA: Insufficient documentation

## 2013-02-03 DIAGNOSIS — G4700 Insomnia, unspecified: Secondary | ICD-10-CM | POA: Insufficient documentation

## 2013-02-03 DIAGNOSIS — IMO0002 Reserved for concepts with insufficient information to code with codable children: Secondary | ICD-10-CM | POA: Insufficient documentation

## 2013-02-03 DIAGNOSIS — Z79899 Other long term (current) drug therapy: Secondary | ICD-10-CM | POA: Insufficient documentation

## 2013-02-03 LAB — CBC WITH DIFFERENTIAL/PLATELET
Basophils Relative: 0 % (ref 0–1)
Eosinophils Absolute: 0 10*3/uL (ref 0.0–0.7)
Eosinophils Relative: 0 % (ref 0–5)
HCT: 31.7 % — ABNORMAL LOW (ref 36.0–46.0)
Hemoglobin: 10 g/dL — ABNORMAL LOW (ref 12.0–15.0)
Lymphocytes Relative: 12 % (ref 12–46)
Lymphs Abs: 0.8 10*3/uL (ref 0.7–4.0)
MCH: 31.7 pg (ref 26.0–34.0)
MCV: 100.6 fL — ABNORMAL HIGH (ref 78.0–100.0)
Monocytes Absolute: 0.8 10*3/uL (ref 0.1–1.0)
Monocytes Relative: 12 % (ref 3–12)
Neutro Abs: 4.9 10*3/uL (ref 1.7–7.7)
Neutrophils Relative %: 76 % (ref 43–77)
RBC: 3.15 MIL/uL — ABNORMAL LOW (ref 3.87–5.11)

## 2013-02-03 LAB — URINALYSIS, ROUTINE W REFLEX MICROSCOPIC
Glucose, UA: NEGATIVE mg/dL
Hgb urine dipstick: NEGATIVE
Ketones, ur: NEGATIVE mg/dL
Protein, ur: 30 mg/dL — AB
Specific Gravity, Urine: 1.022 (ref 1.005–1.030)
Urobilinogen, UA: 1 mg/dL (ref 0.0–1.0)

## 2013-02-03 LAB — BASIC METABOLIC PANEL
BUN: 14 mg/dL (ref 6–23)
CO2: 35 mEq/L — ABNORMAL HIGH (ref 19–32)
Calcium: 9.2 mg/dL (ref 8.4–10.5)
GFR calc Af Amer: >90 mL/min (ref >90)
GFR calc non Af Amer: 84 mL/min — ABNORMAL LOW (ref >90)
Glucose, Bld: 119 mg/dL — ABNORMAL HIGH (ref 70–99)
Potassium: 3.8 mEq/L (ref 3.5–5.1)

## 2013-02-03 LAB — URINE MICROSCOPIC-ADD ON

## 2013-02-03 MED ORDER — ALBUTEROL SULFATE (5 MG/ML) 0.5% IN NEBU
5.0000 mg | INHALATION_SOLUTION | Freq: Once | RESPIRATORY_TRACT | Status: AC
  Administered 2013-02-03: 5 mg via RESPIRATORY_TRACT
  Filled 2013-02-03: qty 1

## 2013-02-03 MED ORDER — CEPHALEXIN 500 MG PO CAPS
500.0000 mg | ORAL_CAPSULE | Freq: Once | ORAL | Status: AC
  Administered 2013-02-03: 500 mg via ORAL
  Filled 2013-02-03: qty 1

## 2013-02-03 MED ORDER — ACETAMINOPHEN 325 MG PO TABS
650.0000 mg | ORAL_TABLET | Freq: Once | ORAL | Status: AC
  Administered 2013-02-03: 650 mg via ORAL
  Filled 2013-02-03: qty 2

## 2013-02-03 MED ORDER — IPRATROPIUM BROMIDE 0.02 % IN SOLN
0.5000 mg | Freq: Once | RESPIRATORY_TRACT | Status: AC
  Administered 2013-02-03: 0.5 mg via RESPIRATORY_TRACT
  Filled 2013-02-03: qty 2.5

## 2013-02-03 MED ORDER — CEPHALEXIN 500 MG PO CAPS: 500.0000 mg | ORAL_CAPSULE | Freq: Four times a day (QID) | ORAL | Status: DC

## 2013-02-03 NOTE — ED Notes (Signed)
RT at bedside.

## 2013-02-03 NOTE — Assessment & Plan Note (Signed)
stable overall by history and exam, recent data reviewed with pt, and pt to continue medical treatment as before,  to f/u any worsening symptoms or concerns BP Readings from Last 3 Encounters:  02/03/13 120/68  01/27/13 100/62  11/04/12 112/78

## 2013-02-03 NOTE — Progress Notes (Signed)
Subjective:    Patient ID: Jean Mitchell, female    DOB: 05-Jul-1927, 79 y.o.   MRN: [id number]  HPI   Here to f/u with acute onset several days fever, wheezing, cough, sob/doe, holding on to the walls when not walking with cane, fatigue but no falls. Has essentially used up all the neb tx she has at home, now out and brought by granddaughter here.  Refuses ambulance, but willing to go to [facility] ER since her o2 is low - resting sat 80%, with exertion 77%.  No CP, but has been dizzy. Past Medical History  Diagnosis Date  . Breast cancer 2007    lumpectomy, XRT previously on Tamoxifen   . Hyperlipidemia   . CHF (congestive heart failure)   . COPD (chronic obstructive pulmonary disease)     o2 at night  . Depression   . AAA (abdominal aortic aneurysm)   . Back pain     lumbar, chronic  . Hypertension   . Osteoporosis     dexa 02-18-08  . Insomnia   . Esophageal stricture   . Adenomatous colon polyp   . Hip fracture 08/2009    R, s/p ORIF  . Arthritis   . Ulcer   . Heart disease   . Diverticulosis   . Anxiety 02/15/2011  . Impaired glucose tolerance 02/18/2011  . Internal hemorrhoids   . 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 Presbyesophagus   . Gastritis   . Tubular adenoma   . Breast cancer    Past Surgical History  Procedure Laterality Date  . Oophorectomy      unilateral  . Knee arthroscopy      right   . Hemorrhoid surgery  1965  . Mastectomy, partial  2007    left  . Abdominal hysterectomy  1968  . Tonsillectomy  1968  . Rotator cuff repair    . Anal carcinoma resection    . Kyphosis surgery      balloon  . Cesarean section      x 2  . Hip fracture surgery  2011    right with plate with persistent weakness of the RLE  . Femur im nail  03/17/2012    Procedure: INTRAMEDULLARY (IM) NAIL FEMORAL;  Surgeon: Shelda Pal, MD;  Location: [facility] ORS;  Service: Orthopedics;  Laterality: Left;    reports that she has been smoking Cigarettes.  She has a 15 pack-year smoking  history. She has never used smokeless tobacco. She reports that she does not drink alcohol or use illicit drugs. family history includes Breast cancer in her sister; Cancer in her mother; Colon cancer (age of onset: 78) in her father; Colon polyps in her sister; Heart disease in her mother and sister. Allergies  Allergen Reactions  . Gabapentin Hives    whelps  . Ezetimibe-Simvastatin   . Percocet [Oxycodone-Acetaminophen] Nausea And Vomiting    5/325 mg   Review of Systems  Constitutional: Negative for unexpected weight change, or unusual diaphoresis  HENT: Negative for tinnitus.   Eyes: Negative for photophobia and visual disturbance.  Respiratory: Negative for choking and stridor.   Gastrointestinal: Negative for vomiting and blood in stool.  Genitourinary: Negative for hematuria and decreased urine volume.  Musculoskeletal: Negative for acute joint swelling Skin: Negative for color change and wound.  Neurological: Negative for tremors and numbness other than noted  Psychiatric/Behavioral: Negative for decreased concentration or  hyperactivity.       Objective:   Physical Exam  BP 120/68  Pulse 103  Temp(Src) 100.1 F (37.8 C) (Oral)  Wt 105 lb 8 oz (47.854 kg) VS noted, mild ill appearing Constitutional: Pt appears well-developed and well-nourished.  HENT: Head: NCAT.  Right Ear: External ear normal.  Left Ear: External ear normal.  Bilat tm's with mild erythema.  Max sinus areas  non tender.  Pharynx with mild erythema, no exudate Eyes: Conjunctivae and EOM are normal. Pupils are equal, round, and reactive to light.  Neck: Normal range of motion. Neck supple.  Cardiovascular: Normal rate and regular rhythm.   Pulmonary/Chest: Effort normal and breath sounds decresed with wheeze bilat, no rales.  Abd:  Soft, NT, non-distended, + BS Neurological: Pt is alert. Not confused  Skin: Skin is warm. No erythema.  Psychiatric: Pt behavior is normal. Thought content  - somewhat  confused?.     Assessment & Plan:

## 2013-02-03 NOTE — ED Notes (Signed)
Pt w/ hx of COPD.  States that she is sometimes on O2 at home.  RA sat 70%.  Denies SOB when wearing o2.  Was sent here from dr for low O2 sat.  States that she was given another flu shot at her doctor on Wednesday.

## 2013-02-03 NOTE — ED Provider Notes (Signed)
CSN: [id number]     Arrival date & time 02/03/13  1756 History   First MD Initiated Contact with Patient 02/03/13 1820     Chief Complaint  Patient presents with  . Shortness of Breath   (Consider location/radiation/quality/duration/timing/severity/associated sxs/prior Treatment) HPI Comments: Jean Mitchell is a 79 y.o. female presents for evaluation of low oxygen saturation. She went to her primary care office today for ongoing symptoms of wheezing, cough, and feeling weak. She relates these symptoms as starting after she had a pneumo coccal vaccine on 01/27/2013. She does have documented fever. At the physician's office today, her oxygen saturation was 80% at rest and 77% with exertion. She is not using her oxygen, at that time. She uses oxygen at night. She denies recent nausea, vomiting, diarrhea, or problems walking. She's been using 3-4 nebulizer treatments each day. No other known modifying factors.   Patient is a 79 y.o. female presenting with shortness of breath. The history is provided by the patient.  Shortness of Breath   Past Medical History  Diagnosis Date  . Breast cancer 2007    lumpectomy, XRT previously on Tamoxifen   . Hyperlipidemia   . CHF (congestive heart failure)   . COPD (chronic obstructive pulmonary disease)     o2 at night  . Depression   . AAA (abdominal aortic aneurysm)   . Back pain     lumbar, chronic  . Hypertension   . Osteoporosis     dexa 02-18-08  . Insomnia   . Esophageal stricture   . Adenomatous colon polyp   . Hip fracture 08/2009    R, s/p ORIF  . Arthritis   . Ulcer   . Heart disease   . Diverticulosis   . Anxiety 02/15/2011  . Impaired glucose tolerance 02/18/2011  . Internal hemorrhoids   . 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 Presbyesophagus   . Gastritis   . Tubular adenoma   . Breast cancer    Past Surgical History  Procedure Laterality Date  . Oophorectomy      unilateral  . Knee arthroscopy      right   . Hemorrhoid  surgery  1965  . Mastectomy, partial  2007    left  . Abdominal hysterectomy  1968  . Tonsillectomy  1968  . Rotator cuff repair    . Anal carcinoma resection    . Kyphosis surgery      balloon  . Cesarean section      x 2  . Hip fracture surgery  2011    right with plate with persistent weakness of the RLE  . Femur im nail  03/17/2012    Procedure: INTRAMEDULLARY (IM) NAIL FEMORAL;  Surgeon: Shelda Pal, MD;  Location: [facility];  Service: Orthopedics;  Laterality: Left;   Family History  Problem Relation Age of Onset  . Heart disease Mother   . Cancer Mother     bladder  . Colon cancer Father 69  . Heart disease Sister   . Breast cancer Sister   . Colon polyps Sister    History  Substance Use Topics  . Smoking status: Smoker, Current Status Unknown -- 0.25 packs/day for 60 years    Types: Cigarettes  . Smokeless tobacco: Never Used  . Alcohol Use: No   OB History   Grav Para Term Preterm Abortions TAB SAB Ect Mult Living                 Review  of Systems  Respiratory: Positive for shortness of breath.   All other systems reviewed and are negative.    Allergies  Gabapentin; Ezetimibe-simvastatin; and Percocet  Home Medications   Current Outpatient Rx  Name  Route  Sig  Dispense  Refill  . ADVAIR DISKUS 100-50 MCG/DOSE AEPB      INHALE 1 PUFF INTO THE LUNGS EVERY 12 HOURS.   60 each   11   . albuterol (PROAIR HFA) 108 (90 BASE) MCG/ACT inhaler   Inhalation   Inhale 2 puffs into the lungs every 6 (six) hours as needed for wheezing.   8.5 g   11   . albuterol (PROVENTIL HFA;VENTOLIN HFA) 108 (90 BASE) MCG/ACT inhaler   Inhalation   Inhale 2 puffs into the lungs every 6 (six) hours as needed. For shortness of breath.   1 Inhaler   11   . albuterol (PROVENTIL) (5 MG/ML) 0.5% nebulizer solution   Nebulization   Take 0.5 mLs (2.5 mg total) by nebulization 3 (three) times daily.   20 mL   11   . chlordiazePOXIDE (LIBRIUM) 5 MG capsule   Oral   Take  1-2 capsules (5-10 mg total) by mouth 3 (three) times daily as needed for anxiety. Take 1 capsule by mouth every morning, 1 capsule in the afternoon, and 2 capsules every evening as needed.   90 capsule   2   . docusate sodium 100 MG CAPS   Oral   Take 100 mg by mouth 2 (two) times daily.   10 capsule      . ferrous sulfate 325 (65 FE) MG tablet   Oral   Take 1 tablet (325 mg total) by mouth 3 (three) times daily after meals.   42 tablet   0     2-3 weeks   . fluticasone (FLONASE) 50 MCG/ACT nasal spray   Nasal   Place 2 sprays into the nose daily.   16 g   6   . HYDROcodone-acetaminophen (NORCO/VICODIN) 5-325 MG per tablet      TAKE 1 TO 2 TABLETS BY MOUTH EVERY 6 HOURS AS NEEDED FOR PAIN. LIMIT 6 TABLETS PER DAY. - to fill Feb 27, 2013   120 tablet   0   . pravastatin (PRAVACHOL) 40 MG tablet   Oral   Take 1 tablet (40 mg total) by mouth daily.   30 tablet   11   . sertraline (ZOLOFT) 100 MG tablet   Oral   Take 1 tablet (100 mg total) by mouth daily.   90 tablet   3   . tiZANidine (ZANAFLEX) 4 MG tablet   Oral   Take 1 tablet (4 mg total) by mouth every 6 (six) hours as needed (spasms).   30 tablet   6   . cephALEXin (KEFLEX) 500 MG capsule   Oral   Take 1 capsule (500 mg total) by mouth 4 (four) times daily.   28 capsule   0    BP 131/71  Pulse 88  Temp(Src) 102.2 F (39 C) (Rectal)  Resp 18  SpO2 94% Physical Exam  Nursing note and vitals reviewed. Constitutional: She is oriented to person, place, and time. She appears well-developed.  Frail, elderly  HENT:  Head: Normocephalic and atraumatic.  Eyes: Conjunctivae and EOM are normal. Pupils are equal, round, and reactive to light.  Neck: Normal range of motion and phonation normal. Neck supple.  Cardiovascular: Normal rate, regular rhythm and intact distal pulses.   Pulmonary/Chest:  Effort normal. No respiratory distress. She has wheezes (ffew scattered). She exhibits no tenderness.   Decreased edema bilaterally, with scattered rhonchi  Abdominal: Soft. She exhibits no distension. There is no tenderness. There is no guarding.  Musculoskeletal: Normal range of motion.  Neurological: She is alert and oriented to person, place, and time. She exhibits normal muscle tone.  Skin: Skin is warm and dry.  Psychiatric: She has a normal mood and affect. Her behavior is normal. Judgment and thought content normal.    ED Course  Procedures (including critical care time) Medications  albuterol (PROVENTIL) (5 MG/ML) 0.5% nebulizer solution 5 mg (5 mg Nebulization Given 02/03/13 1929)  ipratropium (ATROVENT) nebulizer solution 0.5 mg (0.5 mg Nebulization Given 02/03/13 1929)  acetaminophen (TYLENOL) tablet 650 mg (650 mg Oral Given 02/03/13 2311)  cephALEXin (KEFLEX) capsule 500 mg (500 mg Oral Given 02/03/13 2324)    Patient Vitals for the past 24 hrs:  BP Temp Temp src Pulse Resp SpO2  02/03/13 2328 - - - - - 94 %  02/03/13 2303 - 102.2 F (39 C) Rectal - - -  02/03/13 2245 131/71 mmHg - - 88 18 93 %  02/03/13 1930 - - - - - 98 %  02/03/13 1835 112/59 mmHg 100 F (37.8 C) Oral - 18 100 %      Labs Review Labs Reviewed  CBC WITH DIFFERENTIAL - Abnormal; Notable for the following:    RBC 3.15 (*)    Hemoglobin 10.0 (*)    HCT 31.7 (*)    MCV 100.6 (*)    All other components within normal limits  BASIC METABOLIC PANEL - Abnormal; Notable for the following:    Sodium 133 (*)    Chloride 94 (*)    CO2 35 (*)    Glucose, Bld 119 (*)    GFR calc non Af Amer 84 (*)    All other components within normal limits  URINALYSIS, ROUTINE W REFLEX MICROSCOPIC - Abnormal; Notable for the following:    APPearance CLOUDY (*)    Bilirubin Urine SMALL (*)    Protein, ur 30 (*)    All other components within normal limits  URINE MICROSCOPIC-ADD ON - Abnormal; Notable for the following:    Squamous Epithelial / LPF FEW (*)    Bacteria, UA MANY (*)    All other components within  normal limits  URINE CULTURE  OCCULT BLOOD, POC DEVICE   Imaging Review Dg Chest 2 View  02/03/2013   CLINICAL DATA:  Shortness of Breath  EXAM: CHEST - 2 VIEW  COMPARISON:  09/30/2012  FINDINGS: Coarse perihilar interstitial markings, attenuated peripherally. No overt edema or focal infiltrate. Heart size normal. Tortuous atheromatous thoracic aorta. . Chronic blunting of bilateral costophrenic angles consistent with scarring or small effusions. Previous thoracolumbar kyphoplasty/vertebroplasty at 4 levels. Visualized skeletal structures are unremarkable.  IMPRESSION: 1. Hyperinflation and chronic interstitial changes as above, without acute or superimposed abnormality.   Electronically Signed   By: Oley Balm M.D.   On: 02/03/2013 19:15    EKG Interpretation    Date/Time:  Tuesday February 03 2013 18:18:20 EST Ventricular Rate:  84 PR Interval:  136 QRS Duration: 83 QT Interval:  302 QTC Calculation: 357 R Axis:   69 Text Interpretation:  Sinus rhythm Baseline wander in lead(s) V4 V5 V6 since last tracing no significant change Confirmed by Effie Shy  MD, Hershel Corkery ([id number]) on 02/03/2013 10:57:28 PM            MDM  1. Hypoxia   2. UTI (lower urinary tract infection)       Nursing Notes Reviewed/ Care Coordinated, and agree without changes. Applicable Imaging Reviewed.  Interpretation of Laboratory Data incorporated into ED treatment   Plan: Home Medications- increase oxygen to 24/7, Keflex; Home Treatments and Observation- rest, fluids; return here if the recommended treatment, does not improve the symptoms; Recommended follow up- PCP, one week for checkup     Flint Melter, MD 02/03/13 2336

## 2013-02-03 NOTE — Assessment & Plan Note (Signed)
With fever, severe desaturations, have convinced her she needs more than just advair refill, to go to ER for further eval and tx (pt adamant to go to [facility] - used to work there) 

## 2013-02-03 NOTE — Patient Instructions (Signed)
You should go to [facility] ER now, please for further evaluation and treatment; I suspect you will need to stay in hospital for more treatment

## 2013-02-03 NOTE — Assessment & Plan Note (Deleted)
With fever, severe desaturations, have convinced her she needs more than just advair refill, to go to ER for further eval and tx (pt adamant to go to [facility] - used to work there)

## 2013-02-03 NOTE — Progress Notes (Signed)
   CARE MANAGEMENT ED NOTE 02/03/2013  Patient:  TURQUOISE, ESCH   Account Number:  [id number]  Date Initiated:  02/03/2013  Documentation initiated by:  Radford Pax  Subjective/Objective Assessment:   Patient presents to Ed from doctor reporting a low O2 sat.     Subjective/Objective Assessment Detail:   Patient with history of COPD.     Action/Plan:   Action/Plan Detail:   Anticipated DC Date:       Status Recommendation to Physician:   Result of Recommendation:    Other ED Services  Consult Working Plan    DC [employer]  Other    Choice offered to / List presented to:            Status of service:  Completed, signed off  ED Comments:   ED Comments Detail:  EDCM spoke to patient and her daughter-in-law Dorina Hoyer at bedside.  As per patient, she lives by herself with her dog at [address]  Patient reports she has oxygen at home, but she can't remember the name of the agency who delivers it.  She also has a nebulizer at home as well. Patient reports she has a walker, shower chair and safety rails in her bathroom.  Patient also reports, she has a visiting RN coming to her house from th [employer].  Patient reports she has a neighbor across the driveway who checks in on her daily.  Patient's son calls her at least 2-3 times a day to check in on her and visits her on the weekends.  Patient reports her son has been ill and hasn't been able to see her as much.  Patient reports she is usually able to wash, feed and dress herself without any difficulty.

## 2013-02-03 NOTE — Progress Notes (Signed)
Pre-visit discussion using our clinic review tool. No additional management support is needed unless otherwise documented below in the visit note.  

## 2013-02-04 ENCOUNTER — Inpatient Hospital Stay (HOSPITAL_COMMUNITY)
Admission: EM | Admit: 2013-02-04 | Discharge: 2013-02-06 | DRG: 189 | Disposition: A | Discharge status: Home / Self Care | Payer: Medicare Other | Attending: Internal Medicine | Admitting: Internal Medicine

## 2013-02-04 ENCOUNTER — Encounter (HOSPITAL_COMMUNITY): Payer: Self-pay | Admitting: Emergency Medicine

## 2013-02-04 DIAGNOSIS — G4700 Insomnia, unspecified: Secondary | ICD-10-CM | POA: Diagnosis present

## 2013-02-04 DIAGNOSIS — Z8249 Family history of ischemic heart disease and other diseases of the circulatory system: Secondary | ICD-10-CM

## 2013-02-04 DIAGNOSIS — I714 Abdominal aortic aneurysm, without rupture, unspecified: Secondary | ICD-10-CM | POA: Diagnosis present

## 2013-02-04 DIAGNOSIS — C210 Malignant neoplasm of anus, unspecified: Secondary | ICD-10-CM | POA: Diagnosis present

## 2013-02-04 DIAGNOSIS — F329 Major depressive disorder, single episode, unspecified: Secondary | ICD-10-CM

## 2013-02-04 DIAGNOSIS — F17200 Nicotine dependence, unspecified, uncomplicated: Secondary | ICD-10-CM | POA: Diagnosis present

## 2013-02-04 DIAGNOSIS — F411 Generalized anxiety disorder: Secondary | ICD-10-CM | POA: Diagnosis present

## 2013-02-04 DIAGNOSIS — E785 Hyperlipidemia, unspecified: Secondary | ICD-10-CM | POA: Diagnosis present

## 2013-02-04 DIAGNOSIS — Z85048 Personal history of other malignant neoplasm of rectum, rectosigmoid junction, and anus: Secondary | ICD-10-CM

## 2013-02-04 DIAGNOSIS — C50919 Malignant neoplasm of unspecified site of unspecified female breast: Secondary | ICD-10-CM | POA: Diagnosis present

## 2013-02-04 DIAGNOSIS — F3289 Other specified depressive episodes: Secondary | ICD-10-CM | POA: Diagnosis present

## 2013-02-04 DIAGNOSIS — Z9010 Acquired absence of unspecified breast and nipple: Secondary | ICD-10-CM

## 2013-02-04 DIAGNOSIS — Z860100 Personal history of colon polyps, unspecified: Secondary | ICD-10-CM

## 2013-02-04 DIAGNOSIS — J9601 Acute respiratory failure with hypoxia: Secondary | ICD-10-CM

## 2013-02-04 DIAGNOSIS — I10 Essential (primary) hypertension: Secondary | ICD-10-CM

## 2013-02-04 DIAGNOSIS — Z803 Family history of malignant neoplasm of breast: Secondary | ICD-10-CM

## 2013-02-04 DIAGNOSIS — I509 Heart failure, unspecified: Secondary | ICD-10-CM | POA: Diagnosis present

## 2013-02-04 DIAGNOSIS — Z800 Family history of malignant neoplasm of digestive organs: Secondary | ICD-10-CM

## 2013-02-04 DIAGNOSIS — F419 Anxiety disorder, unspecified: Secondary | ICD-10-CM

## 2013-02-04 DIAGNOSIS — K222 Esophageal obstruction: Secondary | ICD-10-CM | POA: Diagnosis present

## 2013-02-04 DIAGNOSIS — J111 Influenza due to unidentified influenza virus with other respiratory manifestations: Secondary | ICD-10-CM | POA: Diagnosis present

## 2013-02-04 DIAGNOSIS — Z8719 Personal history of other diseases of the digestive system: Secondary | ICD-10-CM

## 2013-02-04 DIAGNOSIS — Z83719 Family history of colon polyps, unspecified: Secondary | ICD-10-CM

## 2013-02-04 DIAGNOSIS — Z66 Do not resuscitate: Secondary | ICD-10-CM | POA: Diagnosis present

## 2013-02-04 DIAGNOSIS — Z9981 Dependence on supplemental oxygen: Secondary | ICD-10-CM

## 2013-02-04 DIAGNOSIS — Z79899 Other long term (current) drug therapy: Secondary | ICD-10-CM

## 2013-02-04 DIAGNOSIS — R531 Weakness: Secondary | ICD-10-CM

## 2013-02-04 DIAGNOSIS — Z853 Personal history of malignant neoplasm of breast: Secondary | ICD-10-CM

## 2013-02-04 DIAGNOSIS — J9620 Acute and chronic respiratory failure, unspecified whether with hypoxia or hypercapnia: Principal | ICD-10-CM | POA: Diagnosis present

## 2013-02-04 DIAGNOSIS — R5381 Other malaise: Secondary | ICD-10-CM

## 2013-02-04 DIAGNOSIS — Z8371 Family history of colonic polyps: Secondary | ICD-10-CM

## 2013-02-04 DIAGNOSIS — J441 Chronic obstructive pulmonary disease with (acute) exacerbation: Secondary | ICD-10-CM | POA: Diagnosis present

## 2013-02-04 LAB — POCT I-STAT TROPONIN I: Troponin i, poc: 0.01 ng/mL (ref 0.00–0.08)

## 2013-02-04 LAB — BASIC METABOLIC PANEL
BUN: 9 mg/dL (ref 6–23)
CO2: 36 mEq/L — ABNORMAL HIGH (ref 19–32)
Chloride: 94 mEq/L — ABNORMAL LOW (ref 96–112)
Creatinine, Ser: 0.46 mg/dL — ABNORMAL LOW (ref 0.50–1.10)
GFR calc Af Amer: >90 mL/min (ref >90)
GFR calc non Af Amer: 89 mL/min — ABNORMAL LOW (ref >90)
Glucose, Bld: 83 mg/dL (ref 70–99)
Potassium: 3.9 mEq/L (ref 3.5–5.1)

## 2013-02-04 LAB — CBC
HCT: 34 % — ABNORMAL LOW (ref 36.0–46.0)
MCH: 31.5 pg (ref 26.0–34.0)
MCV: 100.9 fL — ABNORMAL HIGH (ref 78.0–100.0)
Platelets: 157 10*3/uL (ref 150–400)
RBC: 3.37 MIL/uL — ABNORMAL LOW (ref 3.87–5.11)
WBC: 4.6 10*3/uL (ref 4.0–10.5)

## 2013-02-04 MED ORDER — MOMETASONE FURO-FORMOTEROL FUM 100-5 MCG/ACT IN AERO
2.0000 | INHALATION_SPRAY | Freq: Two times a day (BID) | RESPIRATORY_TRACT | Status: DC
  Administered 2013-02-04 – 2013-02-06 (×4): 2 via RESPIRATORY_TRACT
  Filled 2013-02-04: qty 8.8

## 2013-02-04 MED ORDER — SERTRALINE HCL 100 MG PO TABS
100.0000 mg | ORAL_TABLET | Freq: Every day | ORAL | Status: DC
  Administered 2013-02-05 – 2013-02-06 (×2): 100 mg via ORAL
  Filled 2013-02-04 (×3): qty 1

## 2013-02-04 MED ORDER — CLONAZEPAM 0.5 MG PO TABS
0.5000 mg | ORAL_TABLET | Freq: Two times a day (BID) | ORAL | Status: DC | PRN
Start: 2013-02-04 — End: 2013-02-06

## 2013-02-04 MED ORDER — IPRATROPIUM BROMIDE 0.02 % IN SOLN
0.5000 mg | RESPIRATORY_TRACT | Status: DC
  Administered 2013-02-04 – 2013-02-06 (×8): 0.5 mg via RESPIRATORY_TRACT
  Filled 2013-02-04 (×8): qty 2.5

## 2013-02-04 MED ORDER — METHYLPREDNISOLONE SODIUM SUCC 125 MG IJ SOLR
125.0000 mg | Freq: Once | INTRAMUSCULAR | Status: AC
  Administered 2013-02-04: 125 mg via INTRAVENOUS
  Filled 2013-02-04: qty 2

## 2013-02-04 MED ORDER — ALBUTEROL SULFATE (5 MG/ML) 0.5% IN NEBU: 2.5000 mg | INHALATION_SOLUTION | RESPIRATORY_TRACT | Status: DC | PRN

## 2013-02-04 MED ORDER — SODIUM CHLORIDE 0.9 % IJ SOLN
3.0000 mL | Freq: Two times a day (BID) | INTRAMUSCULAR | Status: DC
  Administered 2013-02-04 – 2013-02-06 (×4): 3 mL via INTRAVENOUS

## 2013-02-04 MED ORDER — ALBUTEROL SULFATE (5 MG/ML) 0.5% IN NEBU
2.5000 mg | INHALATION_SOLUTION | RESPIRATORY_TRACT | Status: DC
  Administered 2013-02-04 – 2013-02-06 (×8): 2.5 mg via RESPIRATORY_TRACT
  Filled 2013-02-04 (×8): qty 0.5

## 2013-02-04 MED ORDER — ONDANSETRON HCL 4 MG PO TABS: 4.0000 mg | ORAL_TABLET | Freq: Four times a day (QID) | ORAL | Status: DC | PRN

## 2013-02-04 MED ORDER — HYDROCODONE-ACETAMINOPHEN 5-325 MG PO TABS
1.0000 | ORAL_TABLET | Freq: Four times a day (QID) | ORAL | Status: DC | PRN
  Administered 2013-02-05 – 2013-02-06 (×4): 1 via ORAL
  Filled 2013-02-04 (×4): qty 1

## 2013-02-04 MED ORDER — SODIUM CHLORIDE 0.9 % IV SOLN: 250.0000 mL | INTRAVENOUS | Status: DC | PRN

## 2013-02-04 MED ORDER — IPRATROPIUM BROMIDE 0.02 % IN SOLN
0.5000 mg | Freq: Once | RESPIRATORY_TRACT | Status: AC
  Administered 2013-02-04: 0.5 mg via RESPIRATORY_TRACT
  Filled 2013-02-04: qty 2.5

## 2013-02-04 MED ORDER — LEVOFLOXACIN IN D5W 750 MG/150ML IV SOLN
750.0000 mg | INTRAVENOUS | Status: DC
  Administered 2013-02-04: 750 mg via INTRAVENOUS
  Filled 2013-02-04 (×2): qty 150

## 2013-02-04 MED ORDER — ALBUTEROL SULFATE (5 MG/ML) 0.5% IN NEBU
5.0000 mg | INHALATION_SOLUTION | RESPIRATORY_TRACT | Status: DC | PRN
  Administered 2013-02-04: 5 mg via RESPIRATORY_TRACT
  Filled 2013-02-04: qty 1

## 2013-02-04 MED ORDER — METHYLPREDNISOLONE SODIUM SUCC 125 MG IJ SOLR
60.0000 mg | Freq: Four times a day (QID) | INTRAMUSCULAR | Status: DC
  Administered 2013-02-04 – 2013-02-05 (×3): 60 mg via INTRAVENOUS
  Filled 2013-02-04 (×7): qty 0.96

## 2013-02-04 MED ORDER — ONDANSETRON HCL 4 MG/2ML IJ SOLN
4.0000 mg | Freq: Four times a day (QID) | INTRAMUSCULAR | Status: DC | PRN
  Administered 2013-02-05: 4 mg via INTRAVENOUS
  Filled 2013-02-04: qty 2

## 2013-02-04 MED ORDER — ENOXAPARIN SODIUM 40 MG/0.4ML ~~LOC~~ SOLN
40.0000 mg | SUBCUTANEOUS | Status: DC
  Administered 2013-02-04 – 2013-02-05 (×2): 40 mg via SUBCUTANEOUS
  Filled 2013-02-04 (×3): qty 0.4

## 2013-02-04 MED ORDER — ZOLPIDEM TARTRATE 5 MG PO TABS
5.0000 mg | ORAL_TABLET | Freq: Every evening | ORAL | Status: DC | PRN
  Administered 2013-02-05: 5 mg via ORAL
  Filled 2013-02-04: qty 1

## 2013-02-04 MED ORDER — SODIUM CHLORIDE 0.9 % IJ SOLN: 3.0000 mL | INTRAMUSCULAR | Status: DC | PRN

## 2013-02-04 MED ORDER — TIZANIDINE HCL 4 MG PO TABS
4.0000 mg | ORAL_TABLET | Freq: Four times a day (QID) | ORAL | Status: DC | PRN
  Administered 2013-02-05: 4 mg via ORAL
  Filled 2013-02-04: qty 1

## 2013-02-04 NOTE — Progress Notes (Signed)
Called to get report on patient. Reviewed patient's orders. With telemetry ordered, called bed control to assign new placement for patient.

## 2013-02-04 NOTE — Progress Notes (Signed)
ANTIBIOTIC CONSULT NOTE - INITIAL  Pharmacy Consult for Levaquin Indication: AECOPD  Allergies  Allergen Reactions  . Gabapentin Hives    whelps  . Ezetimibe-Simvastatin     unknown  . Percocet [Oxycodone-Acetaminophen] Nausea And Vomiting    5/325 mg    Patient Measurements: Height: 5' 4.96" (165 cm) Weight: 105 lb 9.6 oz (47.9 kg) IBW/kg (Calculated) : 56.91  Vital Signs: Temp: 98.4 F (36.9 C) (12/24 1900) Temp src: Oral (12/24 1900) BP: 111/57 mmHg (12/24 1900) Pulse Rate: 88 (12/24 1900)  Labs:  Recent Labs  02/03/13 1940 02/04/13 1635  WBC 6.5 4.6  HGB 10.0* 10.6*  PLT 157 157  CREATININE 0.55 0.46*   Medical History: Past Medical History  Diagnosis Date  . Breast cancer 2007    lumpectomy, XRT previously on Tamoxifen   . Hyperlipidemia   . CHF (congestive heart failure)   . COPD (chronic obstructive pulmonary disease)     o2 at night  . Depression   . AAA (abdominal aortic aneurysm)   . Back pain     lumbar, chronic  . Hypertension   . Osteoporosis     dexa 02-18-08  . Insomnia   . Esophageal stricture   . Adenomatous colon polyp   . Hip fracture 08/2009    R, s/p ORIF  . Arthritis   . Ulcer   . Heart disease   . Diverticulosis   . Anxiety 02/15/2011  . Impaired glucose tolerance 02/18/2011  . Internal hemorrhoids   . 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 Presbyesophagus   . Gastritis   . Tubular adenoma   . Breast cancer     Assessment: 6 yoF with h/o COPD on home oxygen at night presented 12/23 with c/o SOB, low O2 sat at PCP office. CXR neg for PNA, pt was discharged home on Keflex (?UTI) and instructed to increase oxygen to 24/7. Pt returned 12/24 with c/o SOB, productive cough, congestion, fevers. Admit for AECOPD.   12/23 >> Keflex >> 12/24 12/24 >> Levaquin >>  Tmax: 102.2 WBCs: wnl Renal: Scr 0.46, CG 40, N 60 (using Scr 0.8)  12/23 urine >> pending 12/24 influenza panel >> ordered   Plan:   Levaquin 750 mg IV  q48h  Pharmacy will f/u  Geoffry Paradise, PharmD, BCPS Pager: [phone] 7:26 PM Pharmacy #: [id number]

## 2013-02-04 NOTE — ED Provider Notes (Addendum)
CSN: [id number]     Arrival date & time 02/04/13  1538 History   First MD Initiated Contact with Patient 02/04/13 1545     Chief Complaint  Patient presents with  . Shortness of Breath    HPI The patient presents to emergency room for persistent shortness of breath.  Patient has been having trouble with cough and congestion for the last one to 2 weeks. Patient has been having fevers. She has myalgias. The cough has been productive of mucus. She went to see her primary Dr. in the office yesterday and was noted to have a low oxygen saturation. She was sent to the emergency room for further evaluation. Patient was evaluated in the emergency room with laboratory tests and a chest x-ray. There was no evidence of pneumonia. She did have a temperature up to 103. Patient wanted to go home so the plan was to discharge her home on a course of oral antibiotics and have her increase her home oxygen to 24 hours as opposed to just at night.  Patient states today she is feeling worse and is more short of breath.  She feels like she does need to be in the hospital. She denies any pain in her chest. She denies any leg swelling. Past Medical History  Diagnosis Date  . Breast cancer 2007    lumpectomy, XRT previously on Tamoxifen   . Hyperlipidemia   . CHF (congestive heart failure)   . COPD (chronic obstructive pulmonary disease)     o2 at night  . Depression   . AAA (abdominal aortic aneurysm)   . Back pain     lumbar, chronic  . Hypertension   . Osteoporosis     dexa 02-18-08  . Insomnia   . Esophageal stricture   . Adenomatous colon polyp   . Hip fracture 08/2009    R, s/p ORIF  . Arthritis   . Ulcer   . Heart disease   . Diverticulosis   . Anxiety 02/15/2011  . Impaired glucose tolerance 02/18/2011  . Internal hemorrhoids   . 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 Presbyesophagus   . Gastritis   . Tubular adenoma   . Breast cancer    Past Surgical History  Procedure Laterality Date  .  Oophorectomy      unilateral  . Knee arthroscopy      right   . Hemorrhoid surgery  1965  . Mastectomy, partial  2007    left  . Abdominal hysterectomy  1968  . Tonsillectomy  1968  . Rotator cuff repair    . Anal carcinoma resection    . Kyphosis surgery      balloon  . Cesarean section      x 2  . Hip fracture surgery  2011    right with plate with persistent weakness of the RLE  . Femur im nail  03/17/2012    Procedure: INTRAMEDULLARY (IM) NAIL FEMORAL;  Surgeon: Shelda Pal, MD;  Location: [facility];  Service: Orthopedics;  Laterality: Left;   Family History  Problem Relation Age of Onset  . Heart disease Mother   . Cancer Mother     bladder  . Colon cancer Father 53  . Heart disease Sister   . Breast cancer Sister   . Colon polyps Sister    History  Substance Use Topics  . Smoking status: Smoker, Current Status Unknown -- 0.25 packs/day for 60 years    Types: Cigarettes  . Smokeless tobacco:  Never Used  . Alcohol Use: No   OB History   Grav Para Term Preterm Abortions TAB SAB Ect Mult Living                 Review of Systems  All other systems reviewed and are negative.    Allergies  Gabapentin; Ezetimibe-simvastatin; and Percocet  Home Medications   Current Outpatient Rx  Name  Route  Sig  Dispense  Refill  . ADVAIR DISKUS 100-50 MCG/DOSE AEPB      INHALE 1 PUFF INTO THE LUNGS EVERY 12 HOURS.   60 each   11   . albuterol (PROAIR HFA) 108 (90 BASE) MCG/ACT inhaler   Inhalation   Inhale 2 puffs into the lungs every 6 (six) hours as needed for wheezing.   8.5 g   11   . albuterol (PROVENTIL) (5 MG/ML) 0.5% nebulizer solution   Nebulization   Take 0.5 mLs (2.5 mg total) by nebulization 3 (three) times daily.   20 mL   11   . cephALEXin (KEFLEX) 500 MG capsule   Oral   Take 1 capsule (500 mg total) by mouth 4 (four) times daily.   28 capsule   0   . chlordiazePOXIDE (LIBRIUM) 5 MG capsule   Oral   Take 1-2 capsules (5-10 mg total) by  mouth 3 (three) times daily as needed for anxiety. Take 1 capsule by mouth every morning, 1 capsule in the afternoon, and 2 capsules every evening as needed.   90 capsule   2   . clonazePAM (KLONOPIN) 0.5 MG tablet   Oral   Take 0.5 mg by mouth 2 (two) times daily as needed for anxiety.         . docusate sodium 100 MG CAPS   Oral   Take 100 mg by mouth 2 (two) times daily.   10 capsule      . ferrous sulfate 325 (65 FE) MG tablet   Oral   Take 1 tablet (325 mg total) by mouth 3 (three) times daily after meals.   42 tablet   0     2-3 weeks   . fluticasone (FLONASE) 50 MCG/ACT nasal spray   Nasal   Place 2 sprays into the nose daily.   16 g   6   . HYDROcodone-acetaminophen (NORCO/VICODIN) 5-325 MG per tablet   Oral   Take 1 tablet by mouth every 6 (six) hours as needed for moderate pain.         . pravastatin (PRAVACHOL) 40 MG tablet   Oral   Take 1 tablet (40 mg total) by mouth daily.   30 tablet   11   . sertraline (ZOLOFT) 100 MG tablet   Oral   Take 1 tablet (100 mg total) by mouth daily.   90 tablet   3   . tiZANidine (ZANAFLEX) 4 MG tablet   Oral   Take 1 tablet (4 mg total) by mouth every 6 (six) hours as needed (spasms).   30 tablet   6   . zaleplon (SONATA) 10 MG capsule   Oral   Take 10 mg by mouth at bedtime as needed for sleep.          BP 130/69  Pulse 80  Temp(Src) 98.3 F (36.8 C)  Resp 20  SpO2 87% Physical Exam  Nursing note and vitals reviewed. Constitutional: No distress.  Frail, elderly  HENT:  Head: Normocephalic and atraumatic.  Right Ear: External ear normal.  Left Ear: External ear normal.  Mouth/Throat: No oropharyngeal exudate.  Eyes: Conjunctivae are normal. Right eye exhibits no discharge. Left eye exhibits no discharge. No scleral icterus.  Neck: Neck supple. No tracheal deviation present.  Cardiovascular: Normal rate, regular rhythm and intact distal pulses.   Pulmonary/Chest: No stridor. She has wheezes.  She has no rales.  Abdominal: Soft. Bowel sounds are normal. She exhibits no distension. There is no tenderness. There is no rebound and no guarding.  Musculoskeletal: She exhibits no edema and no tenderness.  Neurological: She is alert. She has normal strength. No sensory deficit. Cranial nerve deficit:  no gross defecits noted. She exhibits normal muscle tone. She displays no seizure activity. Coordination normal.  Skin: Skin is warm and dry. No rash noted. She is not diaphoretic.  Psychiatric: She has a normal mood and affect.    ED Course  Procedures (including critical care time) Labs Review Labs Reviewed  CBC - Abnormal; Notable for the following:    RBC 3.37 (*)    Hemoglobin 10.6 (*)    HCT 34.0 (*)    MCV 100.9 (*)    All other components within normal limits  BASIC METABOLIC PANEL - Abnormal; Notable for the following:    Chloride 94 (*)    CO2 36 (*)    Creatinine, Ser 0.46 (*)    GFR calc non Af Amer 89 (*)    All other components within normal limits  PRO B NATRIURETIC PEPTIDE  POCT I-STAT TROPONIN I   Imaging Review Dg Chest 2 View  02/03/2013   CLINICAL DATA:  Shortness of Breath  EXAM: CHEST - 2 VIEW  COMPARISON:  09/30/2012  FINDINGS: Coarse perihilar interstitial markings, attenuated peripherally. No overt edema or focal infiltrate. Heart size normal. Tortuous atheromatous thoracic aorta. . Chronic blunting of bilateral costophrenic angles consistent with scarring or small effusions. Previous thoracolumbar kyphoplasty/vertebroplasty at 4 levels. Visualized skeletal structures are unremarkable.  IMPRESSION: 1. Hyperinflation and chronic interstitial changes as above, without acute or superimposed abnormality.   Electronically Signed   By: Oley Balm M.D.   On: 02/03/2013 19:15    EKG Interpretation    Date/Time:  Wednesday February 04 2013 16:45:07 EST Ventricular Rate:  83 PR Interval:  134 QRS Duration: 78 QT Interval:  350 QTC Calculation: 411 R  Axis:   81 Text Interpretation:  Sinus rhythm Borderline right axis deviation Probable anteroseptal infarct, old No significant change since last tracing Confirmed by Leliana Kontz  MD-J, Alyan Hartline ([id number]) on 02/04/2013 4:54:32 PM           Medications  albuterol (PROVENTIL) (5 MG/ML) 0.5% nebulizer solution 5 mg (5 mg Nebulization Given 02/04/13 1623)  ipratropium (ATROVENT) nebulizer solution 0.5 mg (0.5 mg Nebulization Given 02/04/13 1623)  methylPREDNISolone sodium succinate (SOLU-MEDROL) 125 mg/2 mL injection 125 mg (125 mg Intravenous Given 02/04/13 1635)    MDM   1. COPD exacerbation    1808  Pt is stable but still wheezing and feels short of breath.    CXR yesterday without signs of pneumonia.   Attempted outpatient treatment but failed.  Will consult with medical service regarding admission.    Celene Kras, MD 02/04/13 1809  Influenza PCR added on.  Celene Kras, MD 02/04/13 [phone]

## 2013-02-04 NOTE — Progress Notes (Signed)
Patient arrived to 1437, patient oriented to room, placed on tele and oxygen at 2L. VSS. Patient has not complaints of pain at this time. Will report of to Night RN. J.Shuntel Fishburn

## 2013-02-04 NOTE — H&P (Signed)
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 Today presented to the ED with one week history of worsening shortness of breath. She has h/o COPD and is on home oxygen. Patient also had fever, weakness, fatigue but no falls. She was seen by her PCP and prescribed cephalexin, with not much improvement.She  complains of cough with yellow colored expectoration. Patient has  Fever with temperature of 103 in the ED. Also she has mild hypoxia with o2 sats ranging from 87-90%. She denies chest pain, no nausea, vomiting or diarrhea.  Allergies:   Allergies  Allergen Reactions  . Gabapentin Hives    whelps  . Ezetimibe-Simvastatin     unknown  . Percocet [Oxycodone-Acetaminophen] Nausea And Vomiting    5/325 mg      Past Medical History  Diagnosis Date  . Breast cancer 2007    lumpectomy, XRT previously on Tamoxifen   . Hyperlipidemia   . CHF (congestive heart failure)   . COPD (chronic obstructive pulmonary disease)     o2 at night  . Depression   . AAA (abdominal aortic aneurysm)   . Back pain     lumbar, chronic  . Hypertension   . Osteoporosis     dexa 02-18-08  . Insomnia   . Esophageal stricture   . Adenomatous colon polyp   . Hip fracture 08/2009    R, s/p ORIF  . Arthritis   . Ulcer   . Heart disease   . Diverticulosis   . Anxiety 02/15/2011  . Impaired glucose tolerance 02/18/2011  . Internal  hemorrhoids   . 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 Presbyesophagus   . Gastritis   . Tubular adenoma   . Breast cancer     Past Surgical History  Procedure Laterality Date  . Oophorectomy      unilateral  . Knee arthroscopy      right   . Hemorrhoid surgery  1965  . Mastectomy, partial  2007    left  . Abdominal hysterectomy  1968  . Tonsillectomy  1968  . Rotator cuff repair    . Anal carcinoma resection    . Kyphosis surgery      balloon  . Cesarean section      x 2  . Hip fracture surgery  2011    right with plate with persistent weakness of the RLE  . Femur im nail  03/17/2012    Procedure: INTRAMEDULLARY (IM) NAIL FEMORAL;  Surgeon: Shelda Pal, MD;  Location: [facility];  Service: Orthopedics;  Laterality: Left;    Prior to Admission medications   Medication Sig Start Date End Date Taking? Authorizing Provider  ADVAIR DISKUS 100-50 MCG/DOSE AEPB INHALE 1 PUFF INTO THE LUNGS EVERY 12 HOURS. 10/24/12  Yes Corwin Levins, MD  albuterol [facility] HFA) 108 (90 BASE) MCG/ACT inhaler Inhale 2 puffs into the lungs every 6 (six) hours as needed for wheezing. 11/04/12  Yes Corwin Levins, MD  albuterol (PROVENTIL) (5 MG/ML) 0.5% nebulizer solution Take 0.5 mLs (2.5 mg total) by  nebulization 3 (three) times daily. 10/29/12  Yes Corwin Levins, MD  cephALEXin (KEFLEX) 500 MG capsule Take 1 capsule (500 mg total) by mouth 4 (four) times daily. 02/03/13  Yes Flint Melter, MD  chlordiazePOXIDE (LIBRIUM) 5 MG capsule Take 1-2 capsules (5-10 mg total) by mouth 3 (three) times daily as needed for anxiety. Take 1 capsule by mouth every morning, 1 capsule in the afternoon, and 2 capsules every evening as needed. 01/28/13  Yes Corwin Levins, MD  clonazePAM (KLONOPIN) 0.5 MG tablet Take 0.5 mg by mouth 2 (two) times daily as needed for anxiety.   Yes Historical Provider, MD  docusate sodium 100 MG CAPS Take 100 mg by mouth 2 (two) times daily. 03/20/12  Yes Genelle Gather Babish, PA-C  ferrous sulfate  325 (65 FE) MG tablet Take 1 tablet (325 mg total) by mouth 3 (three) times daily after meals. 03/20/12  Yes Genelle Gather Babish, PA-C  fluticasone (FLONASE) 50 MCG/ACT nasal spray Place 2 sprays into the nose daily. 09/30/12  Yes Corwin Levins, MD  HYDROcodone-acetaminophen (NORCO/VICODIN) 5-325 MG per tablet Take 1 tablet by mouth every 6 (six) hours as needed for moderate pain.   Yes Historical Provider, MD  pravastatin (PRAVACHOL) 40 MG tablet Take 1 tablet (40 mg total) by mouth daily. 10/22/11  Yes Corwin Levins, MD  sertraline (ZOLOFT) 100 MG tablet Take 1 tablet (100 mg total) by mouth daily. 11/04/12  Yes Corwin Levins, MD  tiZANidine (ZANAFLEX) 4 MG tablet Take 1 tablet (4 mg total) by mouth every 6 (six) hours as needed (spasms). 08/25/12  Yes Corwin Levins, MD  zaleplon (SONATA) 10 MG capsule Take 10 mg by mouth at bedtime as needed for sleep.   Yes Historical Provider, MD    Social History:  reports that she has been smoking Cigarettes.  She has a 15 pack-year smoking history. She has never used smokeless tobacco. She reports that she does not drink alcohol or use illicit drugs.  Family History  Problem Relation Age of Onset  . Heart disease Mother   . Cancer Mother     bladder  . Colon cancer Father 40  . Heart disease Sister   . Breast cancer Sister   . Colon polyps Sister      All the positives are listed in BOLD  Review of Systems:  HEENT: Headache, blurred vision, runny nose, sore throat Neck: Hypothyroidism, hyperthyroidism,,lymphadenopathy Chest : Shortness of breath, history of COPD, Asthma Heart : Chest pain, history of coronary arterey disease GI:  Nausea, vomiting, diarrhea, constipation, GERD GU: Dysuria, urgency, frequency of urination, hematuria Neuro: Stroke, seizures, syncope Psych: Depression, anxiety, hallucinations   Physical Exam: Blood pressure 130/69, pulse 80, temperature 98.3 F (36.8 C), resp. rate 20, SpO2 87.00%. Constitutional:   Patient is a  well-developed and well-nourished female in no  acute distress and cooperative with exam. Head: Normocephalic and atraumatic Mouth: Mucus membranes moist Eyes: PERRL, EOMI, conjunctivae normal Neck: Supple, No Thyromegaly Cardiovascular: RRR, S1 normal, S2 normal Pulmonary/Chest: Bilateral wheezing Abdominal: Soft. Non-tender, non-distended, bowel sounds are normal, no masses, organomegaly, or guarding present.  Neurological: A&O x3, Strenght is normal and symmetric bilaterally, cranial nerve II-XII are grossly intact, no focal motor deficit, sensory intact to light touch bilaterally.  Extremities : No Cyanosis, Clubbing or Edema   Labs on Admission:  Results for orders placed during the hospital encounter of 02/04/13 (from the past 48 hour(s))  CBC     Status:  Abnormal   Collection Time    02/04/13  4:35 PM      Result Value Range   WBC 4.6  4.0 - 10.5 K/uL   RBC 3.37 (*) 3.87 - 5.11 MIL/uL   Hemoglobin 10.6 (*) 12.0 - 15.0 g/dL   HCT 16.1 (*) 09.6 - 04.5 %   MCV 100.9 (*) 78.0 - 100.0 fL   MCH 31.5  26.0 - 34.0 pg   MCHC 31.2  30.0 - 36.0 g/dL   RDW 40.9  81.1 - 91.4 %   Platelets 157  150 - 400 K/uL  PRO B NATRIURETIC PEPTIDE     Status: None   Collection Time    02/04/13  4:35 PM      Result Value Range   Pro B Natriuretic peptide (BNP) 385.5  0 - 450 pg/mL  BASIC METABOLIC PANEL     Status: Abnormal   Collection Time    02/04/13  4:35 PM      Result Value Range   Sodium 136  135 - 145 mEq/L   Potassium 3.9  3.5 - 5.1 mEq/L   Chloride 94 (*) 96 - 112 mEq/L   CO2 36 (*) 19 - 32 mEq/L   Glucose, Bld 83  70 - 99 mg/dL   BUN 9  6 - 23 mg/dL   Creatinine, Ser 7.82 (*) 0.50 - 1.10 mg/dL   Calcium 9.1  8.4 - 95.6 mg/dL   GFR calc non Af Amer 89 (*) >90 mL/min   GFR calc Af Amer >90  >90 mL/min   Comment: (NOTE)     The eGFR has been calculated using the CKD EPI equation.     This calculation has not been validated in all clinical situations.     eGFR's persistently <90  mL/min signify possible Chronic Kidney     Disease.  POCT I-STAT TROPONIN I     Status: None   Collection Time    02/04/13  4:45 PM      Result Value Range   Troponin i, poc 0.01  0.00 - 0.08 ng/mL   Comment 3            Comment: Due to the release kinetics of cTnI,     a negative result within the first hours     of the onset of symptoms does not rule out     myocardial infarction with certainty.     If myocardial infarction is still suspected,     repeat the test at appropriate intervals.    Radiological Exams on Admission: Dg Chest 2 View  02/03/2013   CLINICAL DATA:  Shortness of Breath  EXAM: CHEST - 2 VIEW  COMPARISON:  09/30/2012  FINDINGS: Coarse perihilar interstitial markings, attenuated peripherally. No overt edema or focal infiltrate. Heart size normal. Tortuous atheromatous thoracic aorta. . Chronic blunting of bilateral costophrenic angles consistent with scarring or small effusions. Previous thoracolumbar kyphoplasty/vertebroplasty at 4 levels. Visualized skeletal structures are unremarkable.  IMPRESSION: 1. Hyperinflation and chronic interstitial changes as above, without acute or superimposed abnormality.   Electronically Signed   By: Oley Balm M.D.   On: 02/03/2013 19:15    Assessment/Plan Principal Problem:   COPD exacerbation Active Problems:   DEPRESSION hyperlipidemia  79 y/o female with h/o COPD now being admitted with COPD exacerbation. Will also obtain influenza pcr. Patient will be started on Duoneb nebulizer, Oxygen via nasal canula, along with IV solumedrol 60 mg q 6 hr, and  Levaquin.  Will  continue her home medications for Depression.  CXR does not show infiltrate. EKG shows NSR and borderline right axis deviation  Code status: patient is DNR   Family discussion: discussed with family at bedside.   Time Spent on Admission: 65 min  Wyley Hack S [facility] Pager: [phone] 02/04/2013, 6:56 PM  If 7PM-7AM, please contact  night-coverage  [email or web address]  Password [email or web address]

## 2013-02-04 NOTE — ED Notes (Signed)
Per pt was here for same symptoms last night, went home because she wanted to be with dog-on home O2 at home but ran out of portable O2

## 2013-02-05 DIAGNOSIS — J9601 Acute respiratory failure with hypoxia: Secondary | ICD-10-CM | POA: Diagnosis present

## 2013-02-05 LAB — URINE CULTURE: Colony Count: NO GROWTH

## 2013-02-05 LAB — CBC
HCT: 32.7 % — ABNORMAL LOW (ref 36.0–46.0)
Hemoglobin: 10.3 g/dL — ABNORMAL LOW (ref 12.0–15.0)
RBC: 3.3 MIL/uL — ABNORMAL LOW (ref 3.87–5.11)
RDW: 13.4 % (ref 11.5–15.5)
WBC: 2 10*3/uL — ABNORMAL LOW (ref 4.0–10.5)

## 2013-02-05 LAB — COMPREHENSIVE METABOLIC PANEL
AST: 15 U/L (ref 0–37)
Albumin: 2.8 g/dL — ABNORMAL LOW (ref 3.5–5.2)
Alkaline Phosphatase: 56 U/L (ref 39–117)
BUN: 10 mg/dL (ref 6–23)
CO2: 35 mEq/L — ABNORMAL HIGH (ref 19–32)
Chloride: 96 mEq/L (ref 96–112)
GFR calc Af Amer: >90 mL/min (ref >90)
Glucose, Bld: 164 mg/dL — ABNORMAL HIGH (ref 70–99)
Potassium: 4.2 mEq/L (ref 3.5–5.1)
Total Bilirubin: 0.2 mg/dL — ABNORMAL LOW (ref 0.3–1.2)

## 2013-02-05 LAB — INFLUENZA PANEL BY PCR (TYPE A & B)
H1N1 flu by pcr: NOT DETECTED
Influenza A By PCR: POSITIVE — AB
Influenza B By PCR: NEGATIVE

## 2013-02-05 MED ORDER — OSELTAMIVIR PHOSPHATE 75 MG PO CAPS
75.0000 mg | ORAL_CAPSULE | Freq: Two times a day (BID) | ORAL | Status: DC
  Administered 2013-02-05: 75 mg via ORAL
  Filled 2013-02-05 (×2): qty 1

## 2013-02-05 MED ORDER — SIMVASTATIN 10 MG PO TABS: 10.0000 mg | ORAL_TABLET | Freq: Every day | ORAL | Status: DC

## 2013-02-05 MED ORDER — METHYLPREDNISOLONE SODIUM SUCC 125 MG IJ SOLR
60.0000 mg | Freq: Every day | INTRAMUSCULAR | Status: DC
  Administered 2013-02-06: 60 mg via INTRAVENOUS
  Filled 2013-02-05: qty 0.96

## 2013-02-05 MED ORDER — FERROUS SULFATE 325 (65 FE) MG PO TABS
325.0000 mg | ORAL_TABLET | Freq: Three times a day (TID) | ORAL | Status: DC
  Administered 2013-02-05 – 2013-02-06 (×4): 325 mg via ORAL
  Filled 2013-02-05 (×6): qty 1

## 2013-02-05 MED ORDER — MAGIC MOUTHWASH
5.0000 mL | Freq: Three times a day (TID) | ORAL | Status: DC | PRN
  Administered 2013-02-05: 5 mL via ORAL
  Filled 2013-02-05: qty 5

## 2013-02-05 MED ORDER — OSELTAMIVIR PHOSPHATE 30 MG PO CAPS
30.0000 mg | ORAL_CAPSULE | Freq: Two times a day (BID) | ORAL | Status: DC
  Administered 2013-02-05 – 2013-02-06 (×2): 30 mg via ORAL
  Filled 2013-02-05 (×3): qty 1

## 2013-02-05 MED ORDER — PRAVASTATIN SODIUM 40 MG PO TABS
40.0000 mg | ORAL_TABLET | Freq: Every day | ORAL | Status: DC
  Administered 2013-02-05: 17:00:00 40 mg via ORAL
  Filled 2013-02-05 (×2): qty 1

## 2013-02-05 NOTE — Progress Notes (Signed)
[facility]                                                                                Patient Demographics  Jean Mitchell, is a 79 y.o. female, DOB - [birth year], WUJ:[id number]  Admit date - 02/04/2013   Admitting Physician Meredeth Ide, MD  Outpatient Primary MD for the patient is [name], MD  LOS - 1   Chief Complaint  Patient presents with  . Shortness of Breath        Assessment & Plan    1. Acute on Chr respiratory failure with hypoxia - to COPD exacerbation/URI, there is also question of her home oxygen concentration machine was working. She is feeling better with IV steroids, is influenza is positive, we'll start on Tamiflu, empiric Levaquin for any superimposed bacterial infection on top of influenza, nebulizer treatments and oxygen. She is on home oxygen. We'll start tapering steroids, case management has been consulted to provide her with a new oxygen concentration machine at home.    2. History of dyslipidemia. Continue home dose statin.    3. Anxiety and depression. No acute issues, Zoloft continued at home dose, as needed benzodiazepine.    4. History of ANAL CANCER, breast cancer. Outpatient followup with PCP in oncologist post discharge. No acute issues.      Code Status: DNR  Family Communication:    Disposition Plan: Home   Procedures     Consults   Case mangement   Medications  Scheduled Meds: . ipratropium  0.5 mg Nebulization Q4H   And  . albuterol  2.5 mg Nebulization Q4H  . enoxaparin (LOVENOX) injection  40 mg Subcutaneous Q24H  . levofloxacin (LEVAQUIN) IV  750 mg Intravenous Q48H  . methylPREDNISolone (SOLU-MEDROL) injection  60 mg Intravenous Q6H  . mometasone-formoterol  2 puff Inhalation BID  . sertraline  100 mg Oral Daily  . sodium chloride  3 mL Intravenous Q12H   Continuous Infusions:  PRN Meds:.sodium chloride, albuterol, clonazePAM, HYDROcodone-acetaminophen, ondansetron (ZOFRAN) IV, ondansetron, sodium  chloride, tiZANidine, zolpidem  DVT Prophylaxis  Lovenox   Lab Results  Component Value Date   PLT 162 02/05/2013    Antibiotics     Anti-infectives   Start     Dose/Rate Route Frequency Ordered Stop   02/04/13 2000  levofloxacin (LEVAQUIN) IVPB 750 mg     750 mg 100 mL/hr over 90 Minutes Intravenous Every 48 hours 02/04/13 1927            Subjective:   Jean Mitchell today has, No headache, No chest pain, No abdominal pain - No Nausea, No new weakness tingling or numbness, No Cough - improved SOB.    Objective:   Filed Vitals:   02/04/13 1900 02/04/13 1930 02/05/13 0455 02/05/13 0833  BP: 111/57 118/67 123/68   Pulse: 88 88 87   Temp: 98.4 F (36.9 C) 99.3 F (37.4 C) 97.3 F (36.3 C)   TempSrc: Oral Oral Oral   Resp: 24 22 18    Height: 5' 4.96" (1.65 m) 5\' 5"  (1.651 m)    Weight: 47.9 kg (105 lb 9.6 oz) 46.8 kg (103 lb 2.8 oz)    SpO2: 90%  93% 100% 91%    Wt Readings from Last 3 Encounters:  02/04/13 46.8 kg (103 lb 2.8 oz)  02/03/13 47.854 kg (105 lb 8 oz)  01/27/13 47.741 kg (105 lb 4 oz)     Intake/Output Summary (Last 24 hours) at 02/05/13 0981 Last data filed at 02/05/13 0900  Gross per 24 hour  Intake    480 ml  Output    650 ml  Net   -170 ml    Exam Awake Alert, Oriented X 3, No new F.N deficits, Normal affect [place].AT,PERRAL Supple Neck,No JVD, No cervical lymphadenopathy appriciated.  Symmetrical Chest wall movement, Good air movement bilaterally, minimal wheezing RRR,No Gallops,Rubs or new Murmurs, No Parasternal Heave +ve B.Sounds, Abd Soft, Non tender, No organomegaly appriciated, No rebound - guarding or rigidity. No Cyanosis, Clubbing or edema, No new Rash or bruise     Data Review   Micro Results Recent Results (from the past 240 hour(s))  URINE CULTURE     Status: None   Collection Time    02/03/13  9:25 PM      Result Value Range Status   Specimen Description URINE, CLEAN CATCH   Final   Special Requests NONE   Final    Culture  Setup Time     Final   Value: 02/04/2013 01:58     Performed at [facility] Count     Final   Value: NO GROWTH     Performed at [facility]   Culture     Final   Value: NO GROWTH     Performed at [facility]   Report Status 02/05/2013 FINAL   Final    Radiology Reports Dg Chest 2 View  02/03/2013   CLINICAL DATA:  Shortness of Breath  EXAM: CHEST - 2 VIEW  COMPARISON:  09/30/2012  FINDINGS: Coarse perihilar interstitial markings, attenuated peripherally. No overt edema or focal infiltrate. Heart size normal. Tortuous atheromatous thoracic aorta. . Chronic blunting of bilateral costophrenic angles consistent with scarring or small effusions. Previous thoracolumbar kyphoplasty/vertebroplasty at 4 levels. Visualized skeletal structures are unremarkable.  IMPRESSION: 1. Hyperinflation and chronic interstitial changes as above, without acute or superimposed abnormality.   Electronically Signed   By: Oley Balm M.D.   On: 02/03/2013 19:15    CBC  Recent Labs Lab 02/03/13 1940 02/04/13 1635 02/05/13 0435  WBC 6.5 4.6 2.0*  HGB 10.0* 10.6* 10.3*  HCT 31.7* 34.0* 32.7*  PLT 157 157 162  MCV 100.6* 100.9* 99.1  MCH 31.7 31.5 31.2  MCHC 31.5 31.2 31.5  RDW 13.7 13.7 13.4  LYMPHSABS 0.8  --   --   MONOABS 0.8  --   --   EOSABS 0.0  --   --   BASOSABS 0.0  --   --     Chemistries   Recent Labs Lab 02/03/13 1940 02/04/13 1635 02/05/13 0435  NA 133* 136 138  K 3.8 3.9 4.2  CL 94* 94* 96  CO2 35* 36* 35*  GLUCOSE 119* 83 164*  BUN 14 9 10   CREATININE 0.55 0.46* 0.49*  CALCIUM 9.2 9.1 8.9  AST  --   --  15  ALT  --   --  11  ALKPHOS  --   --  56  BILITOT  --   --  0.2*   ------------------------------------------------------------------------------------------------------------------ estimated creatinine clearance is 39.4 ml/min (by C-G formula based on Cr of  0.49). ------------------------------------------------------------------------------------------------------------------ No results found for this  basename: HGBA1C,  in the last 72 hours ------------------------------------------------------------------------------------------------------------------ No results found for this basename: CHOL, HDL, LDLCALC, TRIG, CHOLHDL, LDLDIRECT,  in the last 72 hours ------------------------------------------------------------------------------------------------------------------ No results found for this basename: TSH, T4TOTAL, FREET3, T3FREE, THYROIDAB,  in the last 72 hours ------------------------------------------------------------------------------------------------------------------ No results found for this basename: VITAMINB12, FOLATE, FERRITIN, TIBC, IRON, RETICCTPCT,  in the last 72 hours  Coagulation profile No results found for this basename: INR, PROTIME,  in the last 168 hours  No results found for this basename: DDIMER,  in the last 72 hours  Cardiac Enzymes No results found for this basename: CK, CKMB, TROPONINI, MYOGLOBIN,  in the last 168 hours ------------------------------------------------------------------------------------------------------------------ No components found with this basename: POCBNP,      Time Spent in minutes   35   SINGH,PRASHANT K M.D on 02/05/2013 at 9:37 AM  Between 7am to 7pm - Pager - [phone]  After 7pm go to [email or web address] - password [email or web address]  And look for the night coverage person covering for me after hours  [facility] Office  [phone]

## 2013-02-06 DIAGNOSIS — J9600 Acute respiratory failure, unspecified whether with hypoxia or hypercapnia: Secondary | ICD-10-CM

## 2013-02-06 DIAGNOSIS — I10 Essential (primary) hypertension: Secondary | ICD-10-CM

## 2013-02-06 LAB — BASIC METABOLIC PANEL
CO2: 36 mEq/L — ABNORMAL HIGH (ref 19–32)
Calcium: 9.1 mg/dL (ref 8.4–10.5)
GFR calc Af Amer: >90 mL/min (ref >90)
GFR calc non Af Amer: 85 mL/min — ABNORMAL LOW (ref >90)
Sodium: 137 mEq/L (ref 135–145)

## 2013-02-06 LAB — CBC WITH DIFFERENTIAL/PLATELET
Basophils Absolute: 0 10*3/uL (ref 0.0–0.1)
Eosinophils Absolute: 0 10*3/uL (ref 0.0–0.7)
Eosinophils Relative: 0 % (ref 0–5)
Lymphocytes Relative: 22 % (ref 12–46)
MCH: 31.8 pg (ref 26.0–34.0)
MCV: 97.5 fL (ref 78.0–100.0)
Neutro Abs: 2.9 10*3/uL (ref 1.7–7.7)
Platelets: 176 10*3/uL (ref 150–400)
RDW: 13.2 % (ref 11.5–15.5)
WBC: 4.9 10*3/uL (ref 4.0–10.5)

## 2013-02-06 MED ORDER — PREDNISONE 50 MG PO TABS
50.0000 mg | ORAL_TABLET | Freq: Every day | ORAL | Status: DC
  Administered 2013-02-06: 50 mg via ORAL
  Filled 2013-02-06 (×2): qty 1

## 2013-02-06 MED ORDER — LEVOFLOXACIN 750 MG PO TABS: 750.0000 mg | ORAL_TABLET | Freq: Every day | ORAL | Status: DC

## 2013-02-06 MED ORDER — MAGIC MOUTHWASH: 5.0000 mL | Freq: Three times a day (TID) | ORAL | Status: DC | PRN

## 2013-02-06 MED ORDER — OSELTAMIVIR PHOSPHATE 30 MG PO CAPS: 30.0000 mg | ORAL_CAPSULE | Freq: Two times a day (BID) | ORAL | Status: DC

## 2013-02-06 MED ORDER — ALBUTEROL SULFATE (5 MG/ML) 0.5% IN NEBU
2.5000 mg | INHALATION_SOLUTION | Freq: Four times a day (QID) | RESPIRATORY_TRACT | Status: DC
  Administered 2013-02-06 (×2): 2.5 mg via RESPIRATORY_TRACT
  Filled 2013-02-06: qty 0.5

## 2013-02-06 MED ORDER — IPRATROPIUM BROMIDE 0.02 % IN SOLN
0.5000 mg | Freq: Four times a day (QID) | RESPIRATORY_TRACT | Status: DC
  Administered 2013-02-06 (×2): 0.5 mg via RESPIRATORY_TRACT
  Filled 2013-02-06: qty 2.5

## 2013-02-06 MED ORDER — PREDNISONE 10 MG PO TABS: ORAL_TABLET | ORAL | Status: DC

## 2013-02-06 NOTE — Progress Notes (Signed)
Pt d/c home. D/c instruction was reviewed with patient. 02 tank and nasal canal present at bedside. Pt has no questions

## 2013-02-06 NOTE — Care Management Note (Signed)
  Page 1 of 1   02/06/2013     3:25:26 PM   CARE MANAGEMENT NOTE 02/06/2013  Patient:  Jean Mitchell, Jean Mitchell   Account Number:  [id number]  Date Initiated:  02/06/2013  Documentation initiated by:  Colleen Can  Subjective/Objective Assessment:   dx acute resp failure with hypoxia.    Order for patient to get new O2 concentrator at her home.     Action/Plan:   Cm spoke with patient. Plans are for patient to rerurn to her home in [place]. She has RW and cane to use as needed. Pt is O2 dependent   Anticipated DC Date:     Anticipated DC Plan:  HOME/SELF CARE      DC Planning Services  CM consult      Choice offered to / List presented to:             Status of service:   Medicare Important Message given?   (If response is "NO", the following Medicare IM given date fields will be blank) Date Medicare IM given:   Date Additional Medicare IM given:    Discharge Disposition:    Per UR Regulation:    If discussed at Long Length of Stay Meetings, dates discussed:    Comments:  02/06/2013 Colleen Can BSN RN CCM [phone] Pt states she has home oxygen concentrator. Used to use [employer] company who sold out to [employer] company-[phone]. States she has not been getting proper  o2 flow with current concentrator. States she does have portable tank at home. CM spoke with customer service/patient care rep Sedonia Small [phone] who advised that service rep could go to patient's home in [place] to services O2 equipment. States she will call patient's son and advise hime. CM received call from Pat-customer service supervisor who advised company will switch out O2 concentrator and provide O2 portable tank for patient.

## 2013-02-06 NOTE — Discharge Summary (Signed)
Physician Discharge Summary  Jean Mitchell:[id number] DOB: August 25, 1927 DOA: 02/04/2013  PCP: [name], MD  Admit date: 02/04/2013 Discharge date: 02/06/2013  Time spent: 35 minutes  Recommendations for Outpatient Follow-up:  1. New O2 concentrator  Discharge Diagnoses:  Principal Problem:   Acute respiratory failure with hypoxia Active Problems:   ANAL CANCER   DEPRESSION   HYPERTENSION, BENIGN   ESOPHAGEAL STRICTURE   Breast cancer   Anxiety   COPD exacerbation   Discharge Condition: improved- asking to go home  Diet recommendation: cardiac  Filed Weights   02/04/13 1900 02/04/13 1930  Weight: 47.9 kg (105 lb 9.6 oz) 46.8 kg (103 lb 2.8 oz)    History of present illness:  79 y/o female who has a past medical history of Breast cancer (2007); Hyperlipidemia; CHF (congestive heart failure); COPD (chronic obstructive pulmonary disease); Depression; AAA (abdominal aortic aneurysm); Back pain; Hypertension; Osteoporosis; Insomnia; Esophageal stricture; Adenomatous colon polyp; Hip fracture (08/2009); Arthritis; Ulcer; Heart disease; Diverticulosis; Anxiety (02/15/2011); Impaired glucose tolerance (02/18/2011); Internal hemorrhoids; 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  Today presented to the ED with one week history of worsening shortness of breath. She has h/o COPD and is on home oxygen. Patient also had fever, weakness, fatigue but no falls. She was seen by her PCP and prescribed cephalexin, with not much improvement.She complains of cough with yellow colored expectoration. Patient has Fever with temperature of 103 in the ED. Also she has mild hypoxia with o2 sats ranging from 87-90%.  She denies chest pain, no nausea, vomiting or diarrhea.   Hospital Course:  Influenza A positive: tamiflu for total of 10 days  Acute on Chr respiratory failure with hypoxia secondary to COPD exacerbation/URI, there is also  question of her home oxygen concentration machine was working. -tamiflu =wean steroids  -empiric Levaquin for any superimposed bacterial infection on top of influenza, nebulizer treatments and oxygen. She is on home oxygen.  -case management has been consulted to provide her with a new oxygen concentration machine at home.    History of dyslipidemia. Continue home dose statin.   Anxiety and depression. No acute issues, Zoloft continued at home dose, as needed benzodiazepine.   History of ANAL CANCER, breast cancer. Outpatient followup with PCP in oncologist post discharge. No acute issues   Procedures:  none  Consultations:  none  Discharge Exam: Filed Vitals:   02/06/13 0709  BP: 114/62  Pulse: 74  Temp: 98.4 F (36.9 C)  Resp: 16    General: A+Ox3, NAd Cardiovascular: rrr Respiratory: clear, no wheezing  Discharge Instructions      Discharge Orders   Future Appointments Provider Department Dept Phone   07/28/2013 10:15 AM Corwin Levins, MD [facility] [phone]   Future Orders Complete By Expires   Diet - low sodium heart healthy  As directed    Discharge instructions  As directed    Comments:     New oxygen concentrator- 2L home O2   Increase activity slowly  As directed        Medication List    STOP taking these medications       cephALEXin 500 MG capsule  Commonly known as:  KEFLEX      TAKE these medications       ADVAIR DISKUS 100-50 MCG/DOSE Aepb  Generic drug:  Fluticasone-Salmeterol  INHALE 1 PUFF INTO THE LUNGS EVERY 12 HOURS.     albuterol (5 MG/ML) 0.5% nebulizer  solution  Commonly known as:  PROVENTIL  Take 0.5 mLs (2.5 mg total) by nebulization 3 (three) times daily.     albuterol 108 (90 BASE) MCG/ACT inhaler  Commonly known as:  PROAIR HFA  Inhale 2 puffs into the lungs every 6 (six) hours as needed for wheezing.     chlordiazePOXIDE 5 MG capsule  Commonly known as:  LIBRIUM  Take 1-2 capsules  (5-10 mg total) by mouth 3 (three) times daily as needed for anxiety. Take 1 capsule by mouth every morning, 1 capsule in the afternoon, and 2 capsules every evening as needed.     clonazePAM 0.5 MG tablet  Commonly known as:  KLONOPIN  Take 0.5 mg by mouth 2 (two) times daily as needed for anxiety.     DSS 100 MG Caps  Take 100 mg by mouth 2 (two) times daily.     ferrous sulfate 325 (65 FE) MG tablet  Take 1 tablet (325 mg total) by mouth 3 (three) times daily after meals.     fluticasone 50 MCG/ACT nasal spray  Commonly known as:  FLONASE  Place 2 sprays into the nose daily.     HYDROcodone-acetaminophen 5-325 MG per tablet  Commonly known as:  NORCO/VICODIN  Take 1 tablet by mouth every 6 (six) hours as needed for moderate pain.     levofloxacin 750 MG tablet  Commonly known as:  LEVAQUIN  Take 1 tablet (750 mg total) by mouth daily.     magic mouthwash Soln  Take 5 mLs by mouth 3 (three) times daily as needed for mouth pain.     oseltamivir 30 MG capsule  Commonly known as:  TAMIFLU  Take 1 capsule (30 mg total) by mouth 2 (two) times daily.     pravastatin 40 MG tablet  Commonly known as:  PRAVACHOL  Take 1 tablet (40 mg total) by mouth daily.     predniSONE 10 MG tablet  Commonly known as:  DELTASONE  50 mg x 2 days, 40 mg x 2 days, 30 mg x 2 days, 20 mg x 2 days, 10 mg x 2 days, 5 mg x 2 days     sertraline 100 MG tablet  Commonly known as:  ZOLOFT  Take 1 tablet (100 mg total) by mouth daily.     tiZANidine 4 MG tablet  Commonly known as:  ZANAFLEX  Take 1 tablet (4 mg total) by mouth every 6 (six) hours as needed (spasms).     zaleplon 10 MG capsule  Commonly known as:  SONATA  Take 10 mg by mouth at bedtime as needed for sleep.       Allergies  Allergen Reactions  . Gabapentin Hives    whelps  . Ezetimibe-Simvastatin     unknown  . Percocet [Oxycodone-Acetaminophen] Nausea And Vomiting    5/325 mg      The results of significant diagnostics  from this hospitalization (including imaging, microbiology, ancillary and laboratory) are listed below for reference.    Significant Diagnostic Studies: Dg Chest 2 View  02/03/2013   CLINICAL DATA:  Shortness of Breath  EXAM: CHEST - 2 VIEW  COMPARISON:  09/30/2012  FINDINGS: Coarse perihilar interstitial markings, attenuated peripherally. No overt edema or focal infiltrate. Heart size normal. Tortuous atheromatous thoracic aorta. . Chronic blunting of bilateral costophrenic angles consistent with scarring or small effusions. Previous thoracolumbar kyphoplasty/vertebroplasty at 4 levels. Visualized skeletal structures are unremarkable.  IMPRESSION: 1. Hyperinflation and chronic interstitial changes as above, without acute  or superimposed abnormality.   Electronically Signed   By: Oley Balm M.D.   On: 02/03/2013 19:15    Microbiology: Recent Results (from the past 240 hour(s))  URINE CULTURE     Status: None   Collection Time    02/03/13  9:25 PM      Result Value Range Status   Specimen Description URINE, CLEAN CATCH   Final   Special Requests NONE   Final   Culture  Setup Time     Final   Value: 02/04/2013 01:58     Performed at [facility] Count     Final   Value: NO GROWTH     Performed at [facility]   Culture     Final   Value: NO GROWTH     Performed at [facility]   Report Status 02/05/2013 FINAL   Final     Labs: Basic Metabolic Panel:  Recent Labs Lab 02/03/13 1940 02/04/13 1635 02/05/13 0435 02/06/13 0455  NA 133* 136 138 137  K 3.8 3.9 4.2 3.8  CL 94* 94* 96 96  CO2 35* 36* 35* 36*  GLUCOSE 119* 83 164* 95  BUN 14 9 10 13   CREATININE 0.55 0.46* 0.49* 0.54  CALCIUM 9.2 9.1 8.9 9.1   Liver Function Tests:  Recent Labs Lab 02/05/13 0435  AST 15  ALT 11  ALKPHOS 56  BILITOT 0.2*  PROT 6.1  ALBUMIN 2.8*   No results found for this basename: LIPASE, AMYLASE,  in the last 168 hours No results found for this  basename: AMMONIA,  in the last 168 hours CBC:  Recent Labs Lab 02/03/13 1940 02/04/13 1635 02/05/13 0435 02/06/13 0455  WBC 6.5 4.6 2.0* 4.9  NEUTROABS 4.9  --   --  2.9  HGB 10.0* 10.6* 10.3* 10.1*  HCT 31.7* 34.0* 32.7* 31.0*  MCV 100.6* 100.9* 99.1 97.5  PLT 157 157 162 176   Cardiac Enzymes: No results found for this basename: CKTOTAL, CKMB, CKMBINDEX, TROPONINI,  in the last 168 hours BNP: BNP (last 3 results)  Recent Labs  02/04/13 1635  PROBNP 385.5   CBG: No results found for this basename: GLUCAP,  in the last 168 hours     Signed:  Benjamine Mola, Mathius Birkeland  [facility] 02/06/2013, 10:45 AM

## 2013-02-06 NOTE — Progress Notes (Signed)
1630 Portable tank being delivered to patient's room by [employer]

## 2013-02-11 ENCOUNTER — Ambulatory Visit: Payer: Medicare Other | Admitting: Internal Medicine

## 2013-02-18 ENCOUNTER — Ambulatory Visit: Payer: Medicare Other | Admitting: Internal Medicine

## 2013-02-24 ENCOUNTER — Encounter: Payer: Self-pay | Admitting: Internal Medicine

## 2013-02-24 ENCOUNTER — Ambulatory Visit (INDEPENDENT_AMBULATORY_CARE_PROVIDER_SITE_OTHER): Payer: Medicare Other | Admitting: Internal Medicine

## 2013-02-24 VITALS — BP 92/60 | HR 102 | Temp 97.7°F | Wt 106.2 lb

## 2013-02-24 DIAGNOSIS — F411 Generalized anxiety disorder: Secondary | ICD-10-CM

## 2013-02-24 DIAGNOSIS — J449 Chronic obstructive pulmonary disease, unspecified: Secondary | ICD-10-CM | POA: Insufficient documentation

## 2013-02-24 DIAGNOSIS — B370 Candidal stomatitis: Secondary | ICD-10-CM | POA: Insufficient documentation

## 2013-02-24 DIAGNOSIS — J9601 Acute respiratory failure with hypoxia: Secondary | ICD-10-CM

## 2013-02-24 DIAGNOSIS — I10 Essential (primary) hypertension: Secondary | ICD-10-CM

## 2013-02-24 DIAGNOSIS — J9600 Acute respiratory failure, unspecified whether with hypoxia or hypercapnia: Secondary | ICD-10-CM

## 2013-02-24 DIAGNOSIS — F419 Anxiety disorder, unspecified: Secondary | ICD-10-CM

## 2013-02-24 DIAGNOSIS — J441 Chronic obstructive pulmonary disease with (acute) exacerbation: Secondary | ICD-10-CM

## 2013-02-24 MED ORDER — NYSTATIN 100000 UNIT/ML MT SUSP: 500000.0000 [IU] | Freq: Four times a day (QID) | OROMUCOSAL | Status: DC

## 2013-02-24 MED ORDER — CLONAZEPAM 0.5 MG PO TABS: 0.5000 mg | ORAL_TABLET | Freq: Two times a day (BID) | ORAL | Status: DC | PRN

## 2013-02-24 MED ORDER — SERTRALINE HCL 100 MG PO TABS: 100.0000 mg | ORAL_TABLET | Freq: Every day | ORAL | Status: DC

## 2013-02-24 MED ORDER — HYDROCODONE-ACETAMINOPHEN 5-325 MG PO TABS: 1.0000 | ORAL_TABLET | Freq: Four times a day (QID) | ORAL | Status: DC | PRN

## 2013-02-24 NOTE — Assessment & Plan Note (Signed)
Improved, ok to change back to home o2 2L qhs only

## 2013-02-24 NOTE — Assessment & Plan Note (Signed)
stable overall by history and exam, recent data reviewed with pt, and pt to continue medical treatment as before,  to f/u any worsening symptoms or concerns BP Readings from Last 3 Encounters:  02/24/13 92/60  02/06/13 128/74  02/03/13 131/71

## 2013-02-24 NOTE — Progress Notes (Addendum)
Subjective:    Patient ID: Jean Mitchell, female    DOB: February 15, 1927, 80 y.o.   MRN: [id number]  HPI   Here to f/u recent acute resp failure, low o2 from baseline, d/c on prednisone and 24 hr cont home o2 (change from prior, and copd exacerbation.  Pt denies chest pain, increased sob or doe, wheezing, orthopnea, PND, increased LE swelling, palpitations, dizziness or syncope. No worsening cough, fever or productivity. Last prednisone last thur. O2 sat at rest 90%, HR 102, then 95% with exertion, HR 110 with ambulation, pt asymptomatic.  Incidnetly with 'red tongue" after recent prednisone , magic mouthwash only burns, no help Past Medical History  Diagnosis Date  . Breast cancer 2007    lumpectomy, XRT previously on Tamoxifen   . Hyperlipidemia   . CHF (congestive heart failure)   . COPD (chronic obstructive pulmonary disease)     o2 at night  . Depression   . AAA (abdominal aortic aneurysm)   . Back pain     lumbar, chronic  . Hypertension   . Osteoporosis     dexa 02-18-08  . Insomnia   . Esophageal stricture   . Adenomatous colon polyp   . Hip fracture 08/2009    R, s/p ORIF  . Arthritis   . Ulcer   . Heart disease   . Diverticulosis   . Anxiety 02/15/2011  . Impaired glucose tolerance 02/18/2011  . Internal hemorrhoids   . 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 Presbyesophagus   . Gastritis   . Tubular adenoma   . Breast cancer    Past Surgical History  Procedure Laterality Date  . Oophorectomy      unilateral  . Knee arthroscopy      right   . Hemorrhoid surgery  1965  . Mastectomy, partial  2007    left  . Abdominal hysterectomy  1968  . Tonsillectomy  1968  . Rotator cuff repair    . Anal carcinoma resection    . Kyphosis surgery      balloon  . Cesarean section      x 2  . Hip fracture surgery  2011    right with plate with persistent weakness of the RLE  . Femur im nail  03/17/2012    Procedure: INTRAMEDULLARY (IM) NAIL FEMORAL;  Surgeon: Mauri Pole, MD;   Location: [facility];  Service: Orthopedics;  Laterality: Left;    reports that she has been smoking Cigarettes.  She has a 15 pack-year smoking history. She has never used smokeless tobacco. She reports that she does not drink alcohol or use illicit drugs. family history includes Breast cancer in her sister; Cancer in her mother; Colon cancer (age of onset: 5) in her father; Colon polyps in her sister; Heart disease in her mother and sister. Allergies  Allergen Reactions  . Gabapentin Hives    whelps  . Ezetimibe-Simvastatin     unknown  . Percocet [Oxycodone-Acetaminophen] Nausea And Vomiting    5/325 mg   Current Outpatient Prescriptions on File Prior to Visit  Medication Sig Dispense Refill  . ADVAIR DISKUS 100-50 MCG/DOSE AEPB INHALE 1 PUFF INTO THE LUNGS EVERY 12 HOURS.  60 each  11  . albuterol (PROAIR HFA) 108 (90 BASE) MCG/ACT inhaler Inhale 2 puffs into the lungs every 6 (six) hours as needed for wheezing.  8.5 g  11  . albuterol (PROVENTIL) (5 MG/ML) 0.5% nebulizer solution Take 0.5 mLs (2.5 mg total) by nebulization  3 (three) times daily.  20 mL  11  . Alum & Mag Hydroxide-Simeth (MAGIC MOUTHWASH) SOLN Take 5 mLs by mouth 3 (three) times daily as needed for mouth pain.  50 mL  0  . docusate sodium 100 MG CAPS Take 100 mg by mouth 2 (two) times daily.  10 capsule    . ferrous sulfate 325 (65 FE) MG tablet Take 1 tablet (325 mg total) by mouth 3 (three) times daily after meals.  42 tablet  0  . fluticasone (FLONASE) 50 MCG/ACT nasal spray Place 2 sprays into the nose daily.  16 g  6  . pravastatin (PRAVACHOL) 40 MG tablet Take 1 tablet (40 mg total) by mouth daily.  30 tablet  11  . tiZANidine (ZANAFLEX) 4 MG tablet Take 1 tablet (4 mg total) by mouth every 6 (six) hours as needed (spasms).  30 tablet  6  . zaleplon (SONATA) 10 MG capsule Take 10 mg by mouth at bedtime as needed for sleep.       No current facility-administered medications on file prior to visit.     Review of  Systems  Constitutional: Negative for unexpected weight change, or unusual diaphoresis  HENT: Negative for tinnitus.   Eyes: Negative for photophobia and visual disturbance.  Respiratory: Negative for choking and stridor.   Gastrointestinal: Negative for vomiting and blood in stool.  Genitourinary: Negative for hematuria and decreased urine volume.  Musculoskeletal: Negative for acute joint swelling Skin: Negative for color change and wound.  Neurological: Negative for tremors and numbness other than noted  Psychiatric/Behavioral: Negative for decreased concentration or  hyperactivity.       Objective:   Physical Exam BP 92/60  Pulse 102  Temp(Src) 97.7 F (36.5 C) (Oral)  Wt 106 lb 4 oz (48.195 kg)  SpO2 91% VS noted,  Constitutional: Pt appears well-developed and well-nourished.  HENT: Head: NCAT.  Right Ear: External ear normal.  Left Ear: External ear normal.  Eyes: Conjunctivae and EOM are normal. Pupils are equal, round, and reactive to light.  Neck: Normal range of motion. Neck supple.  Cardiovascular: Normal rate and regular rhythm.   Pulmonary/Chest: Effort normal and breath sounds decreased , no wheezes, no rales  Abd:  Soft, NT, non-distended, + BS Neurological: Pt is alert. Not confused  Skin: Skin is warm. No erythema.  Psychiatric: Pt behavior is normal. Thought content normal. mild nervous    Assessment & Plan:

## 2013-02-24 NOTE — Patient Instructions (Addendum)
From your exam today, you may use the oxygen 2L at home at night only, as before  Please take all new medication as prescribed - the solution for the thrush  Please continue all other medications as before, and refills have been done if requested - the sertraline and clonzepam  Stop the generic librium as you have  Please have the pharmacy call with any other refills you may need.  Please keep your appointments with your specialists as you may have planned  No further lab work or treatment today

## 2013-02-24 NOTE — Assessment & Plan Note (Signed)
Improved, no futher prednisone or other tx needed at this time, cont same tx

## 2013-02-24 NOTE — Assessment & Plan Note (Signed)
For nystatin soln asd

## 2013-02-24 NOTE — Assessment & Plan Note (Signed)
Ok for [employer] refill, did not take the librium

## 2013-02-24 NOTE — Progress Notes (Signed)
Pre-visit discussion using our clinic review tool. No additional management support is needed unless otherwise documented below in the visit note.  

## 2013-02-25 ENCOUNTER — Telehealth: Payer: Self-pay | Admitting: Internal Medicine

## 2013-02-25 NOTE — Telephone Encounter (Signed)
Relevant patient education assigned to patient using Emmi. ° °

## 2013-03-05 ENCOUNTER — Telehealth: Payer: Self-pay | Admitting: Internal Medicine

## 2013-03-05 NOTE — Telephone Encounter (Signed)
Relevant patient education assigned to patient using Emmi. ° °

## 2013-03-13 ENCOUNTER — Telehealth: Payer: Self-pay | Admitting: Internal Medicine

## 2013-03-13 NOTE — Telephone Encounter (Signed)
Relevant patient education assigned to patient using Emmi. ° °

## 2013-03-16 ENCOUNTER — Telehealth: Payer: Self-pay | Admitting: *Deleted

## 2013-03-16 MED ORDER — HYDROCODONE-ACETAMINOPHEN 5-325 MG PO TABS: 1.0000 | ORAL_TABLET | Freq: Four times a day (QID) | ORAL | Status: DC | PRN

## 2013-03-16 NOTE — Telephone Encounter (Signed)
Done hardcopy to robin  Please remind pt that I will no longer be prescribing routine refills for this type of medication  I can try to refer to Pain clinic if she likes, but I can no longer do routine refills for this medication, as per the letter she received dec 2014

## 2013-03-16 NOTE — Telephone Encounter (Signed)
Pt phoned requesting refill for hydrocodone.  Last OV with PCP 02/24/13.  Please advise.  CB# [phone]

## 2013-03-17 NOTE — Telephone Encounter (Signed)
Called left detailed message hardcopy is ready for pickup at the front desk.  Informed of all MD's information

## 2013-04-01 ENCOUNTER — Other Ambulatory Visit: Payer: Self-pay | Admitting: Internal Medicine

## 2013-04-10 ENCOUNTER — Other Ambulatory Visit: Payer: Self-pay | Admitting: Internal Medicine

## 2013-04-10 ENCOUNTER — Telehealth: Payer: Self-pay | Admitting: Internal Medicine

## 2013-04-10 DIAGNOSIS — G894 Chronic pain syndrome: Secondary | ICD-10-CM

## 2013-04-10 MED ORDER — HYDROCODONE-ACETAMINOPHEN 5-325 MG PO TABS: 1.0000 | ORAL_TABLET | Freq: Four times a day (QID) | ORAL | Status: DC | PRN

## 2013-04-10 NOTE — Telephone Encounter (Signed)
Patient informed to pickup hardcopy at the front desk.  Also informed to complete controlled med. Contract and UDS.  Patient agreed to do so. Refer to pain management.

## 2013-04-10 NOTE — Telephone Encounter (Signed)
Done hardcopy to robin, but also to let pt know -   As per the letter given dec 2014, I will no longer be providing regular refill prescriptions for schedule II or higher controlled substances (such as hydrocodone)  I can refer to pain clinic if she would like

## 2013-04-10 NOTE — Telephone Encounter (Signed)
Patient also called triage line at [phone] requesting refill on pain meds.  Last OV with PCP 03/06/13 and med last ordered 03/16/13. Please advise  CB# [phone]

## 2013-04-10 NOTE — Telephone Encounter (Signed)
Referral done

## 2013-04-10 NOTE — Telephone Encounter (Signed)
Patient informed of denial

## 2013-04-10 NOTE — Telephone Encounter (Signed)
Patient is asking for new rx for HYDROcodone-acetaminophen (NORCO/VICODIN) 5-325 MG per tablet. Please advise.

## 2013-04-10 NOTE — Telephone Encounter (Signed)
Pt should not take librium, as she is already taking klonopin

## 2013-04-13 ENCOUNTER — Telehealth: Payer: Self-pay | Admitting: Internal Medicine

## 2013-04-13 NOTE — Telephone Encounter (Signed)
Patient called requesting to transfer from Dr. Jenny Reichmann to Dr. Birdie Riddle. She states our office is closer to her home. Is this okay?

## 2013-04-13 NOTE — Telephone Encounter (Signed)
Ok w/ me 

## 2013-04-13 NOTE — Telephone Encounter (Signed)
Ok with me, if ok with Dr Birdie Riddle

## 2013-04-13 NOTE — Telephone Encounter (Signed)
Pt is aware.  Dr. Jenny Reichmann appointment has been cx'd.

## 2013-04-29 ENCOUNTER — Encounter: Payer: Self-pay | Admitting: Internal Medicine

## 2013-05-01 ENCOUNTER — Telehealth: Payer: Self-pay | Admitting: General Practice

## 2013-05-01 ENCOUNTER — Telehealth: Payer: Self-pay

## 2013-05-01 ENCOUNTER — Ambulatory Visit (INDEPENDENT_AMBULATORY_CARE_PROVIDER_SITE_OTHER): Payer: Medicare Other | Admitting: Family Medicine

## 2013-05-01 ENCOUNTER — Encounter: Payer: Self-pay | Admitting: Family Medicine

## 2013-05-01 VITALS — BP 100/60 | HR 91 | Temp 98.7°F | Resp 18 | Ht 61.5 in | Wt 105.2 lb

## 2013-05-01 DIAGNOSIS — F329 Major depressive disorder, single episode, unspecified: Secondary | ICD-10-CM

## 2013-05-01 DIAGNOSIS — F411 Generalized anxiety disorder: Secondary | ICD-10-CM

## 2013-05-01 DIAGNOSIS — F419 Anxiety disorder, unspecified: Secondary | ICD-10-CM

## 2013-05-01 DIAGNOSIS — Z79899 Other long term (current) drug therapy: Secondary | ICD-10-CM

## 2013-05-01 DIAGNOSIS — J449 Chronic obstructive pulmonary disease, unspecified: Secondary | ICD-10-CM

## 2013-05-01 DIAGNOSIS — S72142A Displaced intertrochanteric fracture of left femur, initial encounter for closed fracture: Secondary | ICD-10-CM

## 2013-05-01 DIAGNOSIS — F3289 Other specified depressive episodes: Secondary | ICD-10-CM

## 2013-05-01 MED ORDER — HYDROCODONE-ACETAMINOPHEN 5-325 MG PO TABS: 1.0000 | ORAL_TABLET | Freq: Four times a day (QID) | ORAL | Status: DC | PRN

## 2013-05-01 MED ORDER — CLONAZEPAM 0.5 MG PO TABS: 0.5000 mg | ORAL_TABLET | Freq: Two times a day (BID) | ORAL | Status: AC | PRN

## 2013-05-01 MED ORDER — CHLORDIAZEPOXIDE HCL 5 MG PO CAPS: ORAL_CAPSULE | ORAL | Status: DC

## 2013-05-01 NOTE — Progress Notes (Signed)
   Subjective:    Patient ID: Jean Mitchell, female    DOB: [birth year], 80 y.o.   MRN: [id number]  HPI New to establish.  Previous MD- Jenny Reichmann.  'my nerves are really bad'- wake up in the morning and 'i'm just shaking'.  Chronic problem, on Sertraline daily.  Pt is on Clonazepam twice daily but reports she's taking twice during the day and then 2 at night.  These are the directions prescribed for Chlordiazepoxide.  Pt not seeing anyone for her anxiety.    Hip fx- seeing Dr Alvan Dame.  Hx of osteoporosis.  Pt reports she has been on Hydrocodone x6 yrs, 'it doesn't work any more'.  There is some confusion based on pt's reported use of medicine and what is listed in her 'reconcile dispensed med' section.  Pt reports there's 'no way' she has had 120 hydrocodone at a time  COPD- using O2 at night.  Using nebulizer TID.  Not using Spiriva or Advair due to cost.  Pt has not applied for pt assistance.  Some SOB.  Occasional wheezing.    Social issues- pt lives alone, no assistance w/ medications. Sons live in [place] and [place]   Review of Systems For ROS see HPI     Objective:   Physical Exam  Constitutional: She appears well-developed and well-nourished. No distress.  Elderly woman ambulating w/ cane  HENT:  Head: Normocephalic and atraumatic.  Cardiovascular: Normal rate, regular rhythm, normal heart sounds and intact distal pulses.   Pulmonary/Chest: Effort normal and breath sounds normal. No respiratory distress. She has no wheezes. She has no rales.  Neurological: She is alert.  Easily confused.  Has not realized she confused directions on her 2 meds.  Skin: Skin is warm and dry.  Psychiatric: She has a normal mood and affect. Her behavior is normal.  Argumentative, angry when questioned about her pain meds          Assessment & Plan:

## 2013-05-01 NOTE — Telephone Encounter (Signed)
Left message for call back Non-identifiable   CCS- 03/14/11- adenomatous polyp MMG- 07/20/10- negative BD- 02/20/11- osteoporosis Flu- 10/15/12 Td-02/13/96- DUE PNA- 11/21/09 Z-

## 2013-05-01 NOTE — Telephone Encounter (Signed)
Spoke with [place] from [facility]. She states that she personally delivers Jean Mitchell's medications to her home. Lovey Newcomer gave me the pt's full address that matches what we have on file and advised that the pt has to sign every time the medication was delivered, [facility] cannot leave the medication if pt is not home, and no one else is allowed to sign for pt. Lovey Newcomer also advised that the last deliveries to pt and quantity were as follows: 12/20 #120; 1/14 #60; and 2/2 #120

## 2013-05-01 NOTE — Telephone Encounter (Signed)
After pt was given her AVS from Dr. Birdie Riddle (PT was advised that we could no longer continue to fill her pain medication, and due to amount of controlled substances she should not be driving). Pt was walking out of the room and stated to myself that due to having a smile on my face as I said goodbye that I was laughing at her and her current situation. Pt was advised that was not true, that I was smiling because i was trying to make her feel better. Walked out of room and loudly advised near [facility] desks that she "did not know who the [place] that doctor thought she was telling her she could not have her medications and could not drive".    Message routed to [place] for [place].

## 2013-05-01 NOTE — Patient Instructions (Signed)
Follow up in 1 month to recheck breathing Restart the Advair- 1 puff twice daily Restart the Spiriva- 1 capsule in the inhaler daily You got Hydrocodone on 12/15 #40, 12/20 #120, 1/14 #60, 2/2 #120, 3/4 #60- I will give you another 60 pills today but I will NOT continue to refill this med like this.  This is too much pain medication Please make sure you are taking the Clonazepam twice daily The Librium- Chlordiazepoxide- is 1 in the AM, 1 in the afternoon, and 2 at night We will have someone come out to help you with your medications YOU SHOULD NOT DRIVE b/c of the amount of controlled substances you are on Call with any questions or concerns Hang in there!!!

## 2013-05-01 NOTE — Telephone Encounter (Signed)
Based on conversation and pt's inaccurate account of her controlled substances, she will be dismissed

## 2013-05-01 NOTE — Progress Notes (Signed)
Pre visit review using our clinic review tool, if applicable. No additional management support is needed unless otherwise documented below in the visit note. 

## 2013-05-03 NOTE — Assessment & Plan Note (Signed)
New to provider, ongoing for pt.  She has confused the directions on her librium and klonopin and is taking much more klonopin than originally intended.  Reviewed this w/ her and she became immediately defensive.  Her med use is concerning b/c she is on multiple controlled substances that can impact thought process, memory, falls, etc in the elderly.  Pt not interested in changing med regimen.  Refills provided w/ strict instructions to take as prescribed.

## 2013-05-03 NOTE — Assessment & Plan Note (Signed)
Pt is following w/ ortho.  Has been receiving and likely taking large quantities of hydrocodone- over 400 pills since December.  Pt very angry when I question her about this.  Initially states that this is wrong and there is another Clydene Fake.  Then says that her insurance company would not pay for meds so she is not taking that many.  State database confirms large quantities of meds filled for her.  Pt states this is wrong.  Pharmacy confirms they have delivered this amount of medication to pt.  Told pt I would provide 1 refill today but would not continue to prescribe such large quantities.  Also told her that if she is taking this much pain medication in addition to Klonopin and Librium she should not be driving.  Pt became irate and left office.

## 2013-05-03 NOTE — Assessment & Plan Note (Signed)
New to provider, ongoing for pt.  Not taking any of her controller medications due to cost.  Offered her the possibility of [facility] coming to assist w/ meds.  Pt open to idea.  Samples provided today in office.

## 2013-05-03 NOTE — Assessment & Plan Note (Signed)
New to provider, ongoing for pt.  According to med rec, she is taking Zoloft but pt is unable to tell me this for certain.  Again, this raises concerns about her ability to be home alone, drive independently, and the amount of controlled substances she is taking.

## 2013-05-04 ENCOUNTER — Telehealth: Payer: Self-pay

## 2013-05-04 ENCOUNTER — Telehealth: Payer: Self-pay | Admitting: Family Medicine

## 2013-05-04 ENCOUNTER — Encounter: Payer: Self-pay | Admitting: Family Medicine

## 2013-05-04 NOTE — Telephone Encounter (Addendum)
Patient dismissed from [facility] by Annye Asa MD , effective May 04, 2013. Dismissal letter sent out by certified / registered mail. DAJ  Received signed domestic return receipt verifying delivery of certified letter on May 01, 2013. Article number [id number] DAJ

## 2013-05-04 NOTE — Telephone Encounter (Signed)
Spoke with patient who is requesting to switch to Dr.Paz as PCP and have her dismissal revoked. I advised the patient that since she has been dismissed that is not possible. Encouraged patient to find another PCP and we would forward her records.

## 2013-05-04 NOTE — Telephone Encounter (Signed)
The patient called hoping to switch back to Dr.Paz as her primary care physician.  Before her call came through, a patient advocate from [facility] called on her behalf and asked if Dr.Paz was taking on new patients.  I explained there might be a bit of a wait, but in general, he was taking on new patients.  I did not look up the specific patient when speaking with the rep from [facility]  The patient called and is hoping to get her dismissal revoked and she is hoping to be under the care of Dr.Paz.   Pt callback - [phone]

## 2013-05-05 ENCOUNTER — Telehealth: Payer: Self-pay | Admitting: Family Medicine

## 2013-05-05 NOTE — Telephone Encounter (Signed)
Dismissal Letter sent by Certified Mail [phone]  Received the Return Receipt showing someone picked up the Dismissal Letter 05/07/2013

## 2013-05-06 NOTE — Telephone Encounter (Signed)
Unable to reach patient pre visit.  

## 2013-05-11 ENCOUNTER — Telehealth: Payer: Self-pay | Admitting: Family Medicine

## 2013-05-11 NOTE — Telephone Encounter (Signed)
Correct date -Received signed domestic return receipt verifying delivery of certified letter on March 30,2015.  Article number [id number] DAJ

## 2013-06-24 ENCOUNTER — Emergency Department (HOSPITAL_BASED_OUTPATIENT_CLINIC_OR_DEPARTMENT_OTHER): Payer: Medicare Other

## 2013-06-24 ENCOUNTER — Encounter (HOSPITAL_BASED_OUTPATIENT_CLINIC_OR_DEPARTMENT_OTHER): Payer: Self-pay | Admitting: Emergency Medicine

## 2013-06-24 ENCOUNTER — Emergency Department (HOSPITAL_BASED_OUTPATIENT_CLINIC_OR_DEPARTMENT_OTHER)
Admission: EM | Admit: 2013-06-24 | Discharge: 2013-06-24 | Disposition: A | Discharge status: Home / Self Care | Payer: Medicare Other | Attending: Emergency Medicine | Admitting: Emergency Medicine

## 2013-06-24 DIAGNOSIS — Z853 Personal history of malignant neoplasm of breast: Secondary | ICD-10-CM | POA: Insufficient documentation

## 2013-06-24 DIAGNOSIS — Z860100 Personal history of colon polyps, unspecified: Secondary | ICD-10-CM | POA: Insufficient documentation

## 2013-06-24 DIAGNOSIS — Y92009 Unspecified place in unspecified non-institutional (private) residence as the place of occurrence of the external cause: Secondary | ICD-10-CM | POA: Insufficient documentation

## 2013-06-24 DIAGNOSIS — E785 Hyperlipidemia, unspecified: Secondary | ICD-10-CM | POA: Insufficient documentation

## 2013-06-24 DIAGNOSIS — F329 Major depressive disorder, single episode, unspecified: Secondary | ICD-10-CM | POA: Insufficient documentation

## 2013-06-24 DIAGNOSIS — J449 Chronic obstructive pulmonary disease, unspecified: Secondary | ICD-10-CM | POA: Insufficient documentation

## 2013-06-24 DIAGNOSIS — IMO0002 Reserved for concepts with insufficient information to code with codable children: Secondary | ICD-10-CM | POA: Insufficient documentation

## 2013-06-24 DIAGNOSIS — I10 Essential (primary) hypertension: Secondary | ICD-10-CM | POA: Insufficient documentation

## 2013-06-24 DIAGNOSIS — Z79899 Other long term (current) drug therapy: Secondary | ICD-10-CM | POA: Insufficient documentation

## 2013-06-24 DIAGNOSIS — Z9981 Dependence on supplemental oxygen: Secondary | ICD-10-CM | POA: Insufficient documentation

## 2013-06-24 DIAGNOSIS — J4489 Other specified chronic obstructive pulmonary disease: Secondary | ICD-10-CM | POA: Insufficient documentation

## 2013-06-24 DIAGNOSIS — W19XXXA Unspecified fall, initial encounter: Secondary | ICD-10-CM

## 2013-06-24 DIAGNOSIS — F17200 Nicotine dependence, unspecified, uncomplicated: Secondary | ICD-10-CM | POA: Insufficient documentation

## 2013-06-24 DIAGNOSIS — F411 Generalized anxiety disorder: Secondary | ICD-10-CM | POA: Insufficient documentation

## 2013-06-24 DIAGNOSIS — Z8781 Personal history of (healed) traumatic fracture: Secondary | ICD-10-CM | POA: Insufficient documentation

## 2013-06-24 DIAGNOSIS — I509 Heart failure, unspecified: Secondary | ICD-10-CM | POA: Insufficient documentation

## 2013-06-24 DIAGNOSIS — Z8719 Personal history of other diseases of the digestive system: Secondary | ICD-10-CM | POA: Insufficient documentation

## 2013-06-24 DIAGNOSIS — Z85828 Personal history of other malignant neoplasm of skin: Secondary | ICD-10-CM | POA: Insufficient documentation

## 2013-06-24 DIAGNOSIS — S0990XA Unspecified injury of head, initial encounter: Secondary | ICD-10-CM | POA: Insufficient documentation

## 2013-06-24 DIAGNOSIS — Y9301 Activity, walking, marching and hiking: Secondary | ICD-10-CM | POA: Insufficient documentation

## 2013-06-24 DIAGNOSIS — F3289 Other specified depressive episodes: Secondary | ICD-10-CM | POA: Insufficient documentation

## 2013-06-24 LAB — CBC WITH DIFFERENTIAL/PLATELET
Basophils Absolute: 0 10*3/uL (ref 0.0–0.1)
Basophils Relative: 0 % (ref 0–1)
EOS ABS: 0.1 10*3/uL (ref 0.0–0.7)
EOS PCT: 2 % (ref 0–5)
HCT: 35.9 % — ABNORMAL LOW (ref 36.0–46.0)
HEMOGLOBIN: 11.7 g/dL — AB (ref 12.0–15.0)
LYMPHS ABS: 1.4 10*3/uL (ref 0.7–4.0)
Lymphocytes Relative: 28 % (ref 12–46)
MCH: 31.9 pg (ref 26.0–34.0)
MCHC: 32.6 g/dL (ref 30.0–36.0)
MCV: 97.8 fL (ref 78.0–100.0)
MONOS PCT: 16 % — AB (ref 3–12)
Monocytes Absolute: 0.8 10*3/uL (ref 0.1–1.0)
Neutro Abs: 2.6 10*3/uL (ref 1.7–7.7)
Neutrophils Relative %: 54 % (ref 43–77)
Platelets: 209 10*3/uL (ref 150–400)
RBC: 3.67 MIL/uL — AB (ref 3.87–5.11)
RDW: 12.6 % (ref 11.5–15.5)
WBC: 4.9 10*3/uL (ref 4.0–10.5)

## 2013-06-24 LAB — BASIC METABOLIC PANEL
BUN: 16 mg/dL (ref 6–23)
CO2: 31 mEq/L (ref 19–32)
CREATININE: 0.6 mg/dL (ref 0.50–1.10)
Calcium: 10.3 mg/dL (ref 8.4–10.5)
Chloride: 102 mEq/L (ref 96–112)
GFR calc Af Amer: >90 mL/min (ref >90)
GFR, EST NON AFRICAN AMERICAN: 82 mL/min — AB (ref >90)
GLUCOSE: 101 mg/dL — AB (ref 70–99)
Potassium: 4.2 mEq/L (ref 3.7–5.3)
Sodium: 144 mEq/L (ref 137–147)

## 2013-06-24 LAB — URINALYSIS, ROUTINE W REFLEX MICROSCOPIC
BILIRUBIN URINE: NEGATIVE
Glucose, UA: NEGATIVE mg/dL
Hgb urine dipstick: NEGATIVE
Ketones, ur: NEGATIVE mg/dL
Nitrite: NEGATIVE
PROTEIN: NEGATIVE mg/dL
Specific Gravity, Urine: 1.017 (ref 1.005–1.030)
UROBILINOGEN UA: 0.2 mg/dL (ref 0.0–1.0)
pH: 8 (ref 5.0–8.0)

## 2013-06-24 LAB — URINE MICROSCOPIC-ADD ON

## 2013-06-24 LAB — TROPONIN I: Troponin I: <0.3 ng/mL (ref <0.30)

## 2013-06-24 NOTE — ED Notes (Signed)
Pt ambulated to restroom with cane, pt tolerated well, denied dizziness.

## 2013-06-24 NOTE — Discharge Instructions (Signed)
Head Injury, Adult  You have received a head injury. It does not appear serious at this time. Headaches and vomiting are common following head injury. It should be easy to awaken from sleeping. Sometimes it is necessary for you to stay in the emergency department for a while for observation. Sometimes admission to the hospital may be needed. After injuries such as yours, most problems occur within the first 24 hours, but side effects may occur up to 7 10 days after the injury. It is important for you to carefully monitor your condition and contact your health care provider or seek immediate medical care if there is a change in your condition.  WHAT ARE THE TYPES OF HEAD INJURIES?  Head injuries can be as minor as a bump. Some head injuries can be more severe. More severe head injuries include:  · A jarring injury to the brain (concussion).  · A bruise of the brain (contusion). This mean there is bleeding in the brain that can cause swelling.  · A cracked skull (skull fracture).  · Bleeding in the brain that collects, clots, and forms a bump (hematoma).  WHAT CAUSES A HEAD INJURY?  A serious head injury is most likely to happen to someone who is in a car wreck and is not wearing a seat belt. Other causes of major head injuries include bicycle or motorcycle accidents, sports injuries, and falls.  HOW ARE HEAD INJURIES DIAGNOSED?  A complete history of the event leading to the injury and your current symptoms will be helpful in diagnosing head injuries. Many times, pictures of the brain, such as CT or MRI are needed to see the extent of the injury. Often, an overnight hospital stay is necessary for observation.   WHEN SHOULD I SEEK IMMEDIATE MEDICAL CARE?   You should get help right away if:  · You have confusion or drowsiness.  · You feel sick to your stomach (nauseous) or have continued, forceful vomiting.  · You have dizziness or unsteadiness that is getting worse.  · You have severe, continued headaches not  relieved by medicine. Only take over-the-counter or prescription medicines for pain, fever, or discomfort as directed by your health care provider.  · You do not have normal function of the arms or legs or are unable to walk.  · You notice changes in the black spots in the center of the colored part of your eye (pupil).  · You have a clear or bloody fluid coming from your nose or ears.  · You have a loss of vision.  During the next 24 hours after the injury, you must stay with someone who can watch you for the warning signs. This person should contact local emergency services (911 in the [place] if you have seizures, you become unconscious, or you are unable to wake up.  HOW CAN I PREVENT A HEAD INJURY IN THE FUTURE?  The most important factor for preventing major head injuries is avoiding motor vehicle accidents.  To minimize the potential for damage to your head, it is crucial to wear seat belts while riding in motor vehicles. Wearing helmets while bike riding and playing collision sports (like football) is also helpful. Also, avoiding dangerous activities around the house will further help reduce your risk of head injury.   WHEN CAN I RETURN TO NORMAL ACTIVITIES AND ATHLETICS?  You should be reevaluated by your health care provider before returning to these activities. If you have any of the following symptoms, you should not return   to activities or contact sports until 1 week after the symptoms have stopped:  · Persistent headache.  · Dizziness or vertigo.  · Poor attention and concentration.  · Confusion.  · Memory problems.  · Nausea or vomiting.  · Fatigue or tire easily.  · Irritability.  · Intolerant of bright lights or loud noises.  · Anxiety or depression.  · Disturbed sleep.  MAKE SURE YOU:   · Understand these instructions.  · Will watch your condition.  · Will get help right away if you are not doing well or get worse.  Document Released: 01/29/2005 Document Revised: 11/19/2012 Document Reviewed:  10/06/2012  ExitCare® Patient Information ©2014 [employer].

## 2013-06-24 NOTE — ED Provider Notes (Signed)
CSN: [id number]     Arrival date & time 06/24/13  76 History   First MD Initiated Contact with Patient 06/24/13 1601     Chief Complaint  Patient presents with  . Fall     (Consider location/radiation/quality/duration/timing/severity/associated sxs/prior Treatment) Patient is a 80 y.o. female presenting with fall.  Fall   Pt reports a history of what sounds like orthostatic hypotension (states she has to sit for a few minutes before getting up from bed). 5 days ago she got up to go to the restroom in the morning and as she was walking out of her bedroom began to feel dizzy, fell forward before twisting to the side and hitting the back of her head on the wall. She denies any loss of consciousness but was dazed for a short period. She was unable to get up initially but crawled to the living room and into her recliner. Since that time she has had persistent dizziness. Denies any vomiting, no headache. No CP, SOB, diarrhea or dysuria.  Has not had any changes in her medications recently, although the list in EPIC is outdated and she does not have a more current one to compare.   Past Medical History  Diagnosis Date  . Breast cancer 2007    lumpectomy, XRT previously on Tamoxifen   . Hyperlipidemia   . CHF (congestive heart failure)   . COPD (chronic obstructive pulmonary disease)     o2 at night  . Depression   . AAA (abdominal aortic aneurysm)   . Back pain     lumbar, chronic  . Hypertension   . Osteoporosis     dexa 02-18-08  . Insomnia   . Esophageal stricture   . Adenomatous colon polyp   . Hip fracture 08/2009    R, s/p ORIF  . Arthritis   . Ulcer   . Heart disease   . Diverticulosis   . Anxiety 02/15/2011  . Impaired glucose tolerance 02/18/2011  . Internal hemorrhoids   . 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 Presbyesophagus   . Gastritis   . Tubular adenoma   . Breast cancer    Past Surgical History  Procedure Laterality Date  . Oophorectomy      unilateral  .  Knee arthroscopy      right   . Hemorrhoid surgery  1965  . Mastectomy, partial  2007    left  . Abdominal hysterectomy  1968  . Tonsillectomy  1968  . Rotator cuff repair    . Anal carcinoma resection    . Kyphosis surgery      balloon  . Cesarean section      x 2  . Hip fracture surgery  2011    right with plate with persistent weakness of the RLE  . Femur im nail  03/17/2012    Procedure: INTRAMEDULLARY (IM) NAIL FEMORAL;  Surgeon: Mauri Pole, MD;  Location: [facility];  Service: Orthopedics;  Laterality: Left;   Family History  Problem Relation Age of Onset  . Heart disease Mother   . Cancer Mother     bladder  . Colon cancer Father 96  . Heart disease Sister   . Breast cancer Sister   . Colon polyps Sister    History  Substance Use Topics  . Smoking status: Smoker, Current Status Unknown -- 0.25 packs/day for 60 years    Types: Cigarettes  . Smokeless tobacco: Never Used  . Alcohol Use: No   OB History  Grav Para Term Preterm Abortions TAB SAB Ect Mult Living                 Review of Systems  All other systems reviewed and are negative except as noted in HPI.    Allergies  Gabapentin; Ezetimibe-simvastatin; and Percocet  Home Medications   Prior to Admission medications   Medication Sig Start Date End Date Taking? Authorizing Provider  ADVAIR DISKUS 100-50 MCG/DOSE AEPB INHALE 1 PUFF INTO THE LUNGS EVERY 12 HOURS. 10/24/12   Biagio Borg, MD  albuterol [facility] HFA) 108 (90 BASE) MCG/ACT inhaler Inhale 2 puffs into the lungs every 6 (six) hours as needed for wheezing. 11/04/12   Biagio Borg, MD  albuterol (PROVENTIL) (5 MG/ML) 0.5% nebulizer solution Take 0.5 mLs (2.5 mg total) by nebulization 3 (three) times daily. 10/29/12   Biagio Borg, MD  Alum & Mag Hydroxide-Simeth (MAGIC MOUTHWASH) SOLN Take 5 mLs by mouth 3 (three) times daily as needed for mouth pain. 02/06/13   Geradine Girt, DO  chlordiazePOXIDE (LIBRIUM) 5 MG capsule 1 cap in the morning, 1 in  the afternoon, and 2 in the evening as needed. 05/01/13   Midge Minium, MD  clonazePAM (KLONOPIN) 0.5 MG tablet Take 1 tablet (0.5 mg total) by mouth 2 (two) times daily as needed for anxiety. 05/01/13   Midge Minium, MD  docusate sodium 100 MG CAPS Take 100 mg by mouth 2 (two) times daily. 03/20/12   Lucille Passy Babish, PA-C  ferrous sulfate 325 (65 FE) MG tablet Take 1 tablet (325 mg total) by mouth 3 (three) times daily after meals. 03/20/12   Lucille Passy Babish, PA-C  fluticasone (FLONASE) 50 MCG/ACT nasal spray Place 2 sprays into the nose daily. 09/30/12   Biagio Borg, MD  HYDROcodone-acetaminophen (NORCO/VICODIN) 5-325 MG per tablet Take 1 tablet by mouth every 6 (six) hours as needed for moderate pain. 05/01/13   Midge Minium, MD  nystatin (MYCOSTATIN) 100000 UNIT/ML suspension Take 5 mLs (500,000 Units total) by mouth 4 (four) times daily. 02/24/13   Biagio Borg, MD  pravastatin (PRAVACHOL) 40 MG tablet Take 1 tablet (40 mg total) by mouth daily. 10/22/11   Biagio Borg, MD  sertraline (ZOLOFT) 100 MG tablet Take 1 tablet (100 mg total) by mouth daily. 02/24/13   Biagio Borg, MD  SPIRIVA HANDIHALER 18 MCG inhalation capsule INHALE THE CONTENTS OF 1 CAPSULE VIA HANDIHALER ONCE DAILY. 04/01/13   Biagio Borg, MD  tiZANidine (ZANAFLEX) 4 MG tablet Take 1 tablet (4 mg total) by mouth every 6 (six) hours as needed (spasms). 08/25/12   Biagio Borg, MD  zaleplon (SONATA) 10 MG capsule Take 10 mg by mouth at bedtime as needed for sleep.    Historical Provider, MD   BP 134/74  Pulse 91  Temp(Src) 98.5 F (36.9 C) (Oral)  Resp 16  Ht 5\' 1"  (1.549 m)  Wt 105 lb (47.628 kg)  BMI 19.85 kg/m2  SpO2 99% Physical Exam  Nursing note and vitals reviewed. Constitutional: She is oriented to person, place, and time. She appears well-developed and well-nourished.  HENT:  Head: Normocephalic and atraumatic.  Eyes: EOM are normal. Pupils are equal, round, and reactive to light.  Neck: Normal  range of motion. Neck supple.  Cardiovascular: Normal rate, normal heart sounds and intact distal pulses.   Pulmonary/Chest: Effort normal and breath sounds normal.  Abdominal: Bowel sounds are normal. She exhibits no distension. There  is no tenderness.  Musculoskeletal: Normal range of motion. She exhibits no edema and no tenderness.  Neurological: She is alert and oriented to person, place, and time. She has normal strength. She displays normal reflexes. No cranial nerve deficit or sensory deficit.  Skin: Skin is warm and dry. No rash noted.  Psychiatric: She has a normal mood and affect.    ED Course  Procedures (including critical care time) Labs Review Labs Reviewed  CBC WITH DIFFERENTIAL - Abnormal; Notable for the following:    RBC 3.67 (*)    Hemoglobin 11.7 (*)    HCT 35.9 (*)    Monocytes Relative 16 (*)    All other components within normal limits  BASIC METABOLIC PANEL - Abnormal; Notable for the following:    Glucose, Bld 101 (*)    GFR calc non Af Amer 82 (*)    All other components within normal limits  URINALYSIS, ROUTINE W REFLEX MICROSCOPIC - Abnormal; Notable for the following:    APPearance CLOUDY (*)    Leukocytes, UA MODERATE (*)    All other components within normal limits  URINE MICROSCOPIC-ADD ON - Abnormal; Notable for the following:    Squamous Epithelial / LPF FEW (*)    Bacteria, UA FEW (*)    All other components within normal limits  TROPONIN I    Imaging Review Ct Head Wo Contrast  06/24/2013   CLINICAL DATA:  Fall and left occipital pain.  EXAM: CT HEAD WITHOUT CONTRAST  TECHNIQUE: Contiguous axial images were obtained from the base of the skull through the vertex without contrast.  COMPARISON:  03/17/2012  FINDINGS: No evidence for acute hemorrhage, mass lesion, midline shift, hydrocephalus or large infarct. No acute bone abnormality. Visualized sinuses are clear. Subtle low-density in the subcortical white matter may represent chronic changes.   IMPRESSION: No acute intracranial abnormality.   Electronically Signed   By: Markus Daft M.D.   On: 06/24/2013 16:47     EKG Interpretation   Date/Time:  Wednesday Jun 24 2013 16:22:15 EDT Ventricular Rate:  76 PR Interval:  140 QRS Duration: 74 QT Interval:  362 QTC Calculation: 407 R Axis:   62 Text Interpretation:  Normal sinus rhythm Septal infarct , age  undetermined Abnormal ECG No significant change since last tracing  Confirmed by [facility]  MD, Juanda Crumble [phone]) on 06/24/2013 4:30:20 PM      MDM   Final diagnoses:  Fall  Head injury    Labs and imaging neg. Pt not orthostatic, walks without difficulty here. Ready to go home to take care of her dog. Advised caution when walking and PCP followup.     Jleigh Striplin B. Karle Starch, MD 06/24/13 [phone]

## 2013-06-24 NOTE — ED Notes (Signed)
Pt c/o fall x 5 days ago hitting head on door c/o dizziness

## 2013-07-28 ENCOUNTER — Ambulatory Visit: Payer: Medicare Other | Admitting: Internal Medicine

## 2013-10-28 ENCOUNTER — Emergency Department (HOSPITAL_BASED_OUTPATIENT_CLINIC_OR_DEPARTMENT_OTHER)
Admission: EM | Admit: 2013-10-28 | Discharge: 2013-10-28 | Disposition: A | Discharge status: Home / Self Care | Payer: Medicare Other | Attending: Emergency Medicine | Admitting: Emergency Medicine

## 2013-10-28 ENCOUNTER — Emergency Department (HOSPITAL_BASED_OUTPATIENT_CLINIC_OR_DEPARTMENT_OTHER): Payer: Medicare Other

## 2013-10-28 ENCOUNTER — Encounter (HOSPITAL_BASED_OUTPATIENT_CLINIC_OR_DEPARTMENT_OTHER): Payer: Self-pay | Admitting: Emergency Medicine

## 2013-10-28 DIAGNOSIS — J449 Chronic obstructive pulmonary disease, unspecified: Secondary | ICD-10-CM | POA: Diagnosis not present

## 2013-10-28 DIAGNOSIS — Y929 Unspecified place or not applicable: Secondary | ICD-10-CM | POA: Insufficient documentation

## 2013-10-28 DIAGNOSIS — F329 Major depressive disorder, single episode, unspecified: Secondary | ICD-10-CM | POA: Insufficient documentation

## 2013-10-28 DIAGNOSIS — Z79899 Other long term (current) drug therapy: Secondary | ICD-10-CM | POA: Insufficient documentation

## 2013-10-28 DIAGNOSIS — Z8589 Personal history of malignant neoplasm of other organs and systems: Secondary | ICD-10-CM | POA: Diagnosis not present

## 2013-10-28 DIAGNOSIS — S6990XA Unspecified injury of unspecified wrist, hand and finger(s), initial encounter: Secondary | ICD-10-CM | POA: Diagnosis present

## 2013-10-28 DIAGNOSIS — E785 Hyperlipidemia, unspecified: Secondary | ICD-10-CM | POA: Insufficient documentation

## 2013-10-28 DIAGNOSIS — F411 Generalized anxiety disorder: Secondary | ICD-10-CM | POA: Diagnosis not present

## 2013-10-28 DIAGNOSIS — S92302A Fracture of unspecified metatarsal bone(s), left foot, initial encounter for closed fracture: Secondary | ICD-10-CM

## 2013-10-28 DIAGNOSIS — Y9389 Activity, other specified: Secondary | ICD-10-CM | POA: Diagnosis not present

## 2013-10-28 DIAGNOSIS — W1809XA Striking against other object with subsequent fall, initial encounter: Secondary | ICD-10-CM | POA: Diagnosis not present

## 2013-10-28 DIAGNOSIS — I509 Heart failure, unspecified: Secondary | ICD-10-CM | POA: Insufficient documentation

## 2013-10-28 DIAGNOSIS — J4489 Other specified chronic obstructive pulmonary disease: Secondary | ICD-10-CM | POA: Insufficient documentation

## 2013-10-28 DIAGNOSIS — IMO0002 Reserved for concepts with insufficient information to code with codable children: Secondary | ICD-10-CM | POA: Insufficient documentation

## 2013-10-28 DIAGNOSIS — S92309A Fracture of unspecified metatarsal bone(s), unspecified foot, initial encounter for closed fracture: Secondary | ICD-10-CM | POA: Insufficient documentation

## 2013-10-28 DIAGNOSIS — F3289 Other specified depressive episodes: Secondary | ICD-10-CM | POA: Diagnosis not present

## 2013-10-28 DIAGNOSIS — K222 Esophageal obstruction: Secondary | ICD-10-CM | POA: Insufficient documentation

## 2013-10-28 DIAGNOSIS — F17200 Nicotine dependence, unspecified, uncomplicated: Secondary | ICD-10-CM | POA: Diagnosis not present

## 2013-10-28 DIAGNOSIS — Z860100 Personal history of colon polyps, unspecified: Secondary | ICD-10-CM | POA: Insufficient documentation

## 2013-10-28 DIAGNOSIS — I10 Essential (primary) hypertension: Secondary | ICD-10-CM | POA: Insufficient documentation

## 2013-10-28 DIAGNOSIS — Z8781 Personal history of (healed) traumatic fracture: Secondary | ICD-10-CM | POA: Diagnosis not present

## 2013-10-28 DIAGNOSIS — Z853 Personal history of malignant neoplasm of breast: Secondary | ICD-10-CM | POA: Insufficient documentation

## 2013-10-28 MED ORDER — HYDROCODONE-ACETAMINOPHEN 5-325 MG PO TABS: 2.0000 | ORAL_TABLET | ORAL | Status: DC | PRN

## 2013-10-28 MED ORDER — HYDROCODONE-ACETAMINOPHEN 5-325 MG PO TABS: 2.0000 | ORAL_TABLET | Freq: Once | ORAL | Status: DC

## 2013-10-28 NOTE — ED Notes (Signed)
States she slid off of bench onto ground. Braced self with left hand.C/o swelling to left hand with bruised to hand and left wrist. Able to move fingers and has normal pulses and sensation. Onset last PM.

## 2013-10-28 NOTE — ED Provider Notes (Signed)
CSN: [id number]     Arrival date & time 10/28/13  1200 History   First MD Initiated Contact with Patient 10/28/13 1224     Chief Complaint  Patient presents with  . Hand Injury     (Consider location/radiation/quality/duration/timing/severity/associated sxs/prior Treatment) Patient is a 80 y.o. female presenting with hand injury. The history is provided by the patient. No language interpreter was used.  Hand Injury Location:  Hand Time since incident:  1 day Injury: no   Hand location:  L hand Pain details:    Quality:  Aching   Radiates to:  Does not radiate   Severity:  Moderate   Onset quality:  Gradual   Duration:  1 day   Timing:  Constant   Progression:  Worsening Chronicity:  New Dislocation: no   Foreign body present:  No foreign bodies Relieved by:  Nothing Worsened by:  Nothing tried Ineffective treatments:  None tried Risk factors: no recent illness   Pt fell and hit left hand.  Pt compains of pain and swelling  Past Medical History  Diagnosis Date  . Breast cancer 2007    lumpectomy, XRT previously on Tamoxifen   . Hyperlipidemia   . CHF (congestive heart failure)   . COPD (chronic obstructive pulmonary disease)     o2 at night  . Depression   . AAA (abdominal aortic aneurysm)   . Back pain     lumbar, chronic  . Hypertension   . Osteoporosis     dexa 02-18-08  . Insomnia   . Esophageal stricture   . Adenomatous colon polyp   . Hip fracture 08/2009    R, s/p ORIF  . Arthritis   . Ulcer   . Heart disease   . Diverticulosis   . Anxiety 02/15/2011  . Impaired glucose tolerance 02/18/2011  . Internal hemorrhoids   . 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 Presbyesophagus   . Gastritis   . Tubular adenoma   . Breast cancer    Past Surgical History  Procedure Laterality Date  . Oophorectomy      unilateral  . Knee arthroscopy      right   . Hemorrhoid surgery  1965  . Mastectomy, partial  2007    left  . Abdominal hysterectomy  1968  .  Tonsillectomy  1968  . Rotator cuff repair    . Anal carcinoma resection    . Kyphosis surgery      balloon  . Cesarean section      x 2  . Hip fracture surgery  2011    right with plate with persistent weakness of the RLE  . Femur im nail  03/17/2012    Procedure: INTRAMEDULLARY (IM) NAIL FEMORAL;  Surgeon: Mauri Pole, MD;  Location: [facility];  Service: Orthopedics;  Laterality: Left;   Family History  Problem Relation Age of Onset  . Heart disease Mother   . Cancer Mother     bladder  . Colon cancer Father 39  . Heart disease Sister   . Breast cancer Sister   . Colon polyps Sister    History  Substance Use Topics  . Smoking status: Current Every Day Smoker -- 0.50 packs/day for 60 years    Types: Cigarettes  . Smokeless tobacco: Never Used  . Alcohol Use: No   OB History   Grav Para Term Preterm Abortions TAB SAB Ect Mult Living  Review of Systems  All other systems reviewed and are negative.     Allergies  Gabapentin; Ezetimibe-simvastatin; and Percocet  Home Medications   Prior to Admission medications   Medication Sig Start Date End Date Taking? Authorizing Provider  albuterol (PROAIR HFA) 108 (90 BASE) MCG/ACT inhaler Inhale 2 puffs into the lungs every 6 (six) hours as needed for wheezing. 11/04/12  Yes Biagio Borg, MD  albuterol (PROVENTIL) (5 MG/ML) 0.5% nebulizer solution Take 0.5 mLs (2.5 mg total) by nebulization 3 (three) times daily. 10/29/12  Yes Biagio Borg, MD  Alum & Mag Hydroxide-Simeth (MAGIC MOUTHWASH) SOLN Take 5 mLs by mouth 3 (three) times daily as needed for mouth pain. 02/06/13  Yes Geradine Girt, DO  clonazePAM (KLONOPIN) 0.5 MG tablet Take 1 tablet (0.5 mg total) by mouth 2 (two) times daily as needed for anxiety. 05/01/13  Yes Midge Minium, MD  fluticasone (FLONASE) 50 MCG/ACT nasal spray Place 2 sprays into the nose daily. 09/30/12  Yes Biagio Borg, MD  ADVAIR DISKUS 100-50 MCG/DOSE AEPB INHALE 1 PUFF INTO THE  LUNGS EVERY 12 HOURS. 10/24/12   Biagio Borg, MD  chlordiazePOXIDE (LIBRIUM) 5 MG capsule 1 cap in the morning, 1 in the afternoon, and 2 in the evening as needed. 05/01/13   Midge Minium, MD  docusate sodium 100 MG CAPS Take 100 mg by mouth 2 (two) times daily. 03/20/12   Lucille Passy Babish, PA-C  ferrous sulfate 325 (65 FE) MG tablet Take 1 tablet (325 mg total) by mouth 3 (three) times daily after meals. 03/20/12   Lucille Passy Babish, PA-C  HYDROcodone-acetaminophen (NORCO/VICODIN) 5-325 MG per tablet Take 1 tablet by mouth every 6 (six) hours as needed for moderate pain. 05/01/13   Midge Minium, MD  nystatin (MYCOSTATIN) 100000 UNIT/ML suspension Take 5 mLs (500,000 Units total) by mouth 4 (four) times daily. 02/24/13   Biagio Borg, MD  pravastatin (PRAVACHOL) 40 MG tablet Take 1 tablet (40 mg total) by mouth daily. 10/22/11   Biagio Borg, MD  sertraline (ZOLOFT) 100 MG tablet Take 1 tablet (100 mg total) by mouth daily. 02/24/13   Biagio Borg, MD  SPIRIVA HANDIHALER 18 MCG inhalation capsule INHALE THE CONTENTS OF 1 CAPSULE VIA HANDIHALER ONCE DAILY. 04/01/13   Biagio Borg, MD  tiZANidine (ZANAFLEX) 4 MG tablet Take 1 tablet (4 mg total) by mouth every 6 (six) hours as needed (spasms). 08/25/12   Biagio Borg, MD  zaleplon (SONATA) 10 MG capsule Take 10 mg by mouth at bedtime as needed for sleep.    Historical Provider, MD   BP 119/73  Pulse 88  Temp(Src) 97.9 F (36.6 C) (Oral)  Resp 18  Wt 100 lb (45.36 kg)  SpO2 96% Physical Exam  Nursing note and vitals reviewed. Constitutional: She is oriented to person, place, and time. She appears well-developed and well-nourished.  HENT:  Head: Normocephalic.  Musculoskeletal: She exhibits tenderness.  Bruised swollen left hand,  nv and ns intact  Neurological: She is alert and oriented to person, place, and time.  Skin: Skin is warm.  Psychiatric: She has a normal mood and affect.    ED Course  Procedures (including critical care  time) Labs Review Labs Reviewed - No data to display  Imaging Review No results found.   EKG Interpretation None      MDM   Final diagnoses:  Fracture of 5th metatarsal, left, closed, initial encounter  Ulnar gutter Hydrocodone Follow up with Dr. Alvan Dame for recheck in 1 week    Fransico Meadow, PA-C 10/28/13 1359

## 2013-10-28 NOTE — Discharge Instructions (Signed)
Cast or Splint Care Casts and splints support injured limbs and keep bones from moving while they heal.  HOME CARE  Keep the cast or splint uncovered during the drying period.  A plaster cast can take 24 to 48 hours to dry.  A fiberglass cast will dry in less than 1 hour.  Do not rest the cast on anything harder than a pillow for 24 hours.  Do not put weight on your injured limb. Do not put pressure on the cast. Wait for your doctor's approval.  Keep the cast or splint dry.  Cover the cast or splint with a plastic bag during baths or wet weather.  If you have a cast over your chest and belly (trunk), take sponge baths until the cast is taken off.  If your cast gets wet, dry it with a towel or blow dryer. Use the cool setting on the blow dryer.  Keep your cast or splint clean. Wash a dirty cast with a damp cloth.  Do not put any objects under your cast or splint.  Do not scratch the skin under the cast with an object. If itching is a problem, use a blow dryer on a cool setting over the itchy area.  Do not trim or cut your cast.  Do not take out the padding from inside your cast.  Exercise your joints near the cast as told by your doctor.  Raise (elevate) your injured limb on 1 or 2 pillows for the first 1 to 3 days. GET HELP IF:  Your cast or splint cracks.  Your cast or splint is too tight or too loose.  You itch badly under the cast.  Your cast gets wet or has a soft spot.  You have a bad smell coming from the cast.  You get an object stuck under the cast.  Your skin around the cast becomes red or sore.  You have new or more pain after the cast is put on. GET HELP RIGHT AWAY IF:  You have fluid leaking through the cast.  You cannot move your fingers or toes.  Your fingers or toes turn blue or white or are cool, painful, or puffy (swollen).  You have tingling or lose feeling (numbness) around the injured area.  You have bad pain or pressure under the  cast.  You have trouble breathing or have shortness of breath.  You have chest pain. Document Released: 05/31/2010 Document Revised: 10/01/2012 Document Reviewed: 08/07/2012 [facility] Patient Information 2015 [place], [place]. This information is not intended to replace advice given to you by your health care provider. Make sure you discuss any questions you have with your health care provider. Hand Fracture, Fifth Metacarpal The small metacarpal is the bone at the base of the little finger between the knuckle and the wrist. A fracture is a break in that bone. One of the fractures that is common to this bone is called a Boxer's Fracture. TREATMENT These fractures can be treated with:   Reduction (bones moved back into place), then pinned through the skin to maintain the position, and then casted for about 6 weeks or as your caregiver determines necessary.  ORIF (open reduction and internal fixation) - the fracture site is opened and the bone pieces are fixed into place with pins and then casted for approximately 6 weeks or as your caregiver determines necessary. Your caregiver will discuss the type of fracture you have and the treatment that should be best for that problem. If surgery is  the treatment of choice, the following is information for you to know, and also let your caregiver know about prior to surgery.  LET YOUR CAREGIVER KNOW ABOUT:  Allergies.  Medications taken including herbs, eye drops, over the counter medications, and creams.  Use of steroids (by mouth or creams).  Previous problems with anesthetics or novocaine.  Possibility of pregnancy, if this applies.  History of blood clots (thrombophlebitis).  History of bleeding or blood problems.  Previous surgery.  Other health problems. AFTER THE PROCEDURE After surgery, you will be taken to the recovery area where a nurse will watch and check your progress. Once you're awake, stable, and taking fluids well, barring  other problems you'll be allowed to go home. Once home an ice pack applied to your operative site may help with discomfort and keep the swelling down. HOME CARE INSTRUCTIONS   Follow your caregiver's instructions as to activities, exercises, physical therapy, and driving a car.  Daily exercise is helpful for maintaining range of motion (movement and mobility) and strength. Exercise as instructed.  To lessen swelling, keep the injured hand elevated above the level of your heart as much as possible.  Apply ice to the injury for 15-20 minutes each hour while awake for the first 2 days. Put the ice in a plastic bag and place a thin towel between the bag of ice and your cast.  Move the fingers of your casted hand at least several times a day.  If a plaster or fiberglass cast was applied:  Do not try to scratch the skin under the cast using a sharp or pointed object.  Check the skin around the cast every day. You may put lotion on red or sore areas.  Keep your cast dry. Your cast can be protected during bathing with a plastic bag. Do not put your cast into the water.  If a plaster splint was applied:  Wear the splint for as long as directed by your caregiver or until seen for follow-up examination.  Do not get your splint wet. Protect it during bathing with a plastic bag.  You may loosen the elastic bandage around the splint if your fingers start to get numb, tingle, get cold or turn blue.  Do not put pressure on your cast or splint; this may cause it to break. Especially, do not lean plaster casts on hard surfaces for 24 hours after application.  Take medications as directed by your caregiver.  Only take over-the-counter or prescription medicines for pain, discomfort, or fever as directed by your caregiver.  Follow all instructions for physician referrals, physical therapy, and rehabilitation. Any delay in obtaining necessary care could result in permanent injury, disability and chronic  pain. SEEK MEDICAL CARE IF:   Increased bleeding (more than a small spot) from the wound or from beneath your cast or splint if there is a wound beneath the cast from surgery.  Redness, swelling, or increasing pain in the wound or from beneath your cast or splint.  Pus coming from wound or from beneath your cast or splint.  An unexplained oral temperature above 102 F (38.9 C) develops.  A foul smell coming from the wound or dressing or from beneath your cast or splint.  You are unable to move your little finger. SEEK IMMEDIATE MEDICAL CARE IF:  You develop a rash, have difficulty breathing, or have any allergy problems. If you do not have a window in your cast for observing the wound, a discharge or minor bleeding  may show up as a stain on the outside of your cast. Report these findings to your caregiver. MAKE SURE YOU:   Understand these instructions.  Will watch your condition.  Will get help right away if you are not doing well or get worse. Document Released: 05/07/2000 Document Revised: 04/23/2011 Document Reviewed: 09/18/2007 [facility] Patient Information 2015 [place], [place]. This information is not intended to replace advice given to you by your health care provider. Make sure you discuss any questions you have with your health care provider.

## 2013-10-29 NOTE — ED Provider Notes (Signed)
Medical screening examination/treatment/procedure(s) were performed by non-physician practitioner and as supervising physician I was immediately available for consultation/collaboration.     Veryl Speak, MD 10/29/13 [phone]

## 2013-11-13 ENCOUNTER — Other Ambulatory Visit: Payer: Self-pay | Admitting: Internal Medicine

## 2013-11-16 ENCOUNTER — Emergency Department (HOSPITAL_BASED_OUTPATIENT_CLINIC_OR_DEPARTMENT_OTHER): Payer: Medicare Other

## 2013-11-16 ENCOUNTER — Emergency Department (HOSPITAL_BASED_OUTPATIENT_CLINIC_OR_DEPARTMENT_OTHER)
Admission: EM | Admit: 2013-11-16 | Discharge: 2013-11-16 | Disposition: A | Discharge status: Home / Self Care | Payer: Medicare Other | Attending: Emergency Medicine | Admitting: Emergency Medicine

## 2013-11-16 ENCOUNTER — Encounter (HOSPITAL_BASED_OUTPATIENT_CLINIC_OR_DEPARTMENT_OTHER): Payer: Self-pay | Admitting: Emergency Medicine

## 2013-11-16 DIAGNOSIS — W01198A Fall on same level from slipping, tripping and stumbling with subsequent striking against other object, initial encounter: Secondary | ICD-10-CM | POA: Insufficient documentation

## 2013-11-16 DIAGNOSIS — Z79899 Other long term (current) drug therapy: Secondary | ICD-10-CM | POA: Insufficient documentation

## 2013-11-16 DIAGNOSIS — I509 Heart failure, unspecified: Secondary | ICD-10-CM | POA: Diagnosis not present

## 2013-11-16 DIAGNOSIS — Z86010 Personal history of colonic polyps: Secondary | ICD-10-CM | POA: Diagnosis not present

## 2013-11-16 DIAGNOSIS — E785 Hyperlipidemia, unspecified: Secondary | ICD-10-CM | POA: Diagnosis not present

## 2013-11-16 DIAGNOSIS — Z872 Personal history of diseases of the skin and subcutaneous tissue: Secondary | ICD-10-CM | POA: Insufficient documentation

## 2013-11-16 DIAGNOSIS — Z23 Encounter for immunization: Secondary | ICD-10-CM | POA: Diagnosis not present

## 2013-11-16 DIAGNOSIS — F419 Anxiety disorder, unspecified: Secondary | ICD-10-CM | POA: Diagnosis not present

## 2013-11-16 DIAGNOSIS — Z791 Long term (current) use of non-steroidal anti-inflammatories (NSAID): Secondary | ICD-10-CM | POA: Insufficient documentation

## 2013-11-16 DIAGNOSIS — S0181XA Laceration without foreign body of other part of head, initial encounter: Secondary | ICD-10-CM

## 2013-11-16 DIAGNOSIS — S0121XA Laceration without foreign body of nose, initial encounter: Secondary | ICD-10-CM | POA: Diagnosis present

## 2013-11-16 DIAGNOSIS — Y9389 Activity, other specified: Secondary | ICD-10-CM | POA: Insufficient documentation

## 2013-11-16 DIAGNOSIS — Z8719 Personal history of other diseases of the digestive system: Secondary | ICD-10-CM | POA: Insufficient documentation

## 2013-11-16 DIAGNOSIS — F329 Major depressive disorder, single episode, unspecified: Secondary | ICD-10-CM | POA: Insufficient documentation

## 2013-11-16 DIAGNOSIS — G4700 Insomnia, unspecified: Secondary | ICD-10-CM | POA: Insufficient documentation

## 2013-11-16 DIAGNOSIS — M1990 Unspecified osteoarthritis, unspecified site: Secondary | ICD-10-CM | POA: Insufficient documentation

## 2013-11-16 DIAGNOSIS — Z7952 Long term (current) use of systemic steroids: Secondary | ICD-10-CM | POA: Diagnosis not present

## 2013-11-16 DIAGNOSIS — Y9289 Other specified places as the place of occurrence of the external cause: Secondary | ICD-10-CM | POA: Insufficient documentation

## 2013-11-16 DIAGNOSIS — Z853 Personal history of malignant neoplasm of breast: Secondary | ICD-10-CM | POA: Insufficient documentation

## 2013-11-16 DIAGNOSIS — S022XXA Fracture of nasal bones, initial encounter for closed fracture: Secondary | ICD-10-CM | POA: Insufficient documentation

## 2013-11-16 DIAGNOSIS — I10 Essential (primary) hypertension: Secondary | ICD-10-CM | POA: Insufficient documentation

## 2013-11-16 MED ORDER — LIDOCAINE-EPINEPHRINE-TETRACAINE (LET) SOLUTION
3.0000 mL | Freq: Once | NASAL | Status: AC
  Administered 2013-11-16: 3 mL via TOPICAL
  Filled 2013-11-16: qty 3

## 2013-11-16 MED ORDER — HYDROCODONE-ACETAMINOPHEN 5-325 MG PO TABS: 2.0000 | ORAL_TABLET | ORAL | Status: DC | PRN

## 2013-11-16 MED ORDER — TETANUS-DIPHTH-ACELL PERTUSSIS 5-2.5-18.5 LF-MCG/0.5 IM SUSP
0.5000 mL | Freq: Once | INTRAMUSCULAR | Status: AC
  Administered 2013-11-16: 0.5 mL via INTRAMUSCULAR
  Filled 2013-11-16: qty 0.5

## 2013-11-16 MED ORDER — HYDROCODONE-ACETAMINOPHEN 5-325 MG PO TABS
2.0000 | ORAL_TABLET | Freq: Once | ORAL | Status: AC
  Administered 2013-11-16: 2 via ORAL
  Filled 2013-11-16: qty 2

## 2013-11-16 NOTE — ED Provider Notes (Signed)
History/physical exam/procedure(s) were performed by non-physician practitioner and as supervising physician I was immediately available for consultation/collaboration. I have reviewed all notes and am in agreement with care and plan.   Shaune Pollack, MD 11/16/13 [phone]

## 2013-11-16 NOTE — ED Provider Notes (Signed)
CSN: [id number]     Arrival date & time 11/16/13  1238 History   First MD Initiated Contact with Patient 11/16/13 1400     Chief Complaint  Patient presents with  . Facial Laceration  . Fall     (Consider location/radiation/quality/duration/timing/severity/associated sxs/prior Treatment) Patient is a 80 y.o. female presenting with fall. The history is provided by the patient. No language interpreter was used.  Fall This is a new problem. The current episode started today. The problem occurs constantly. The problem has been gradually worsening. Pertinent negatives include no rash or weakness. Nothing aggravates the symptoms. She has tried nothing for the symptoms.  Pt tripped over oxygen tubing  Past Medical History  Diagnosis Date  . Breast cancer 2007    lumpectomy, XRT previously on Tamoxifen   . Hyperlipidemia   . CHF (congestive heart failure)   . COPD (chronic obstructive pulmonary disease)     o2 at night  . Depression   . AAA (abdominal aortic aneurysm)   . Back pain     lumbar, chronic  . Hypertension   . Osteoporosis     dexa 02-18-08  . Insomnia   . Esophageal stricture   . Adenomatous colon polyp   . Hip fracture 08/2009    R, s/p ORIF  . Arthritis   . Ulcer   . Heart disease   . Diverticulosis   . Anxiety 02/15/2011  . Impaired glucose tolerance 02/18/2011  . Internal hemorrhoids   . 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 Presbyesophagus   . Gastritis   . Tubular adenoma   . Breast cancer    Past Surgical History  Procedure Laterality Date  . Oophorectomy      unilateral  . Knee arthroscopy      right   . Hemorrhoid surgery  1965  . Mastectomy, partial  2007    left  . Abdominal hysterectomy  1968  . Tonsillectomy  1968  . Rotator cuff repair    . Anal carcinoma resection    . Kyphosis surgery      balloon  . Cesarean section      x 2  . Hip fracture surgery  2011    right with plate with persistent weakness of the RLE  . Femur im nail   03/17/2012    Procedure: INTRAMEDULLARY (IM) NAIL FEMORAL;  Surgeon: Mauri Pole, MD;  Location: [facility];  Service: Orthopedics;  Laterality: Left;   Family History  Problem Relation Age of Onset  . Heart disease Mother   . Cancer Mother     bladder  . Colon cancer Father 56  . Heart disease Sister   . Breast cancer Sister   . Colon polyps Sister    History  Substance Use Topics  . Smoking status: Current Every Day Smoker -- 0.50 packs/day for 60 years    Types: Cigarettes  . Smokeless tobacco: Never Used  . Alcohol Use: No   OB History   Grav Para Term Preterm Abortions TAB SAB Ect Mult Living                 Review of Systems  Skin: Positive for wound. Negative for rash.  Neurological: Negative for weakness.  All other systems reviewed and are negative.     Allergies  Gabapentin; Ezetimibe-simvastatin; and Percocet  Home Medications   Prior to Admission medications   Medication Sig Start Date End Date Taking? Authorizing Provider  ADVAIR DISKUS 100-50 MCG/DOSE AEPB INHALE  1 PUFF INTO THE LUNGS EVERY 12 HOURS. 10/24/12   Biagio Borg, MD  albuterol [facility] HFA) 108 (90 BASE) MCG/ACT inhaler Inhale 2 puffs into the lungs every 6 (six) hours as needed for wheezing. 11/04/12   Biagio Borg, MD  albuterol (PROVENTIL) (5 MG/ML) 0.5% nebulizer solution Take 0.5 mLs (2.5 mg total) by nebulization 3 (three) times daily. 10/29/12   Biagio Borg, MD  Alum & Mag Hydroxide-Simeth (MAGIC MOUTHWASH) SOLN Take 5 mLs by mouth 3 (three) times daily as needed for mouth pain. 02/06/13   Geradine Girt, DO  chlordiazePOXIDE (LIBRIUM) 5 MG capsule 1 cap in the morning, 1 in the afternoon, and 2 in the evening as needed. 05/01/13   Midge Minium, MD  clonazePAM (KLONOPIN) 0.5 MG tablet Take 1 tablet (0.5 mg total) by mouth 2 (two) times daily as needed for anxiety. 05/01/13   Midge Minium, MD  docusate sodium 100 MG CAPS Take 100 mg by mouth 2 (two) times daily. 03/20/12   Lucille Passy  Babish, PA-C  ferrous sulfate 325 (65 FE) MG tablet Take 1 tablet (325 mg total) by mouth 3 (three) times daily after meals. 03/20/12   Lucille Passy Babish, PA-C  fluticasone (FLONASE) 50 MCG/ACT nasal spray Place 2 sprays into the nose daily. 09/30/12   Biagio Borg, MD  HYDROcodone-acetaminophen (NORCO/VICODIN) 5-325 MG per tablet Take 1 tablet by mouth every 6 (six) hours as needed for moderate pain. 05/01/13   Midge Minium, MD  HYDROcodone-acetaminophen (NORCO/VICODIN) 5-325 MG per tablet Take 2 tablets by mouth every 4 (four) hours as needed. 10/28/13   Fransico Meadow, PA-C  HYDROcodone-acetaminophen (NORCO/VICODIN) 5-325 MG per tablet Take 2 tablets by mouth every 4 (four) hours as needed. 11/16/13   Fransico Meadow, PA-C  nystatin (MYCOSTATIN) 100000 UNIT/ML suspension Take 5 mLs (500,000 Units total) by mouth 4 (four) times daily. 02/24/13   Biagio Borg, MD  pravastatin (PRAVACHOL) 40 MG tablet Take 1 tablet (40 mg total) by mouth daily. 10/22/11   Biagio Borg, MD  sertraline (ZOLOFT) 100 MG tablet Take 1 tablet (100 mg total) by mouth daily. 02/24/13   Biagio Borg, MD  SPIRIVA HANDIHALER 18 MCG inhalation capsule INHALE THE CONTENTS OF 1 CAPSULE VIA HANDIHALER ONCE DAILY. 04/01/13   Biagio Borg, MD  tiZANidine (ZANAFLEX) 4 MG tablet Take 1 tablet (4 mg total) by mouth every 6 (six) hours as needed (spasms). 08/25/12   Biagio Borg, MD  zaleplon (SONATA) 10 MG capsule Take 10 mg by mouth at bedtime as needed for sleep.    Historical Provider, MD   BP 136/78  Pulse 75  Temp(Src) 98.8 F (37.1 C)  SpO2 92% Physical Exam  Nursing note and vitals reviewed. Constitutional: She is oriented to person, place, and time. She appears well-developed and well-nourished.  HENT:  Head: Normocephalic and atraumatic.  Right Ear: External ear normal.  Left Ear: External ear normal.  Mouth/Throat: Oropharynx is clear and moist.  Bruised nose  1cm laceration nose  Eyes: Conjunctivae and EOM are normal.  Pupils are equal, round, and reactive to light.  Neck: Normal range of motion.  Cardiovascular: Normal rate and normal heart sounds.   Pulmonary/Chest: Effort normal.  Abdominal: Soft. She exhibits no distension.  Musculoskeletal: Normal range of motion.  Neurological: She is alert and oriented to person, place, and time.  Skin: Skin is warm.  Psychiatric: She has a normal mood and affect.  ED Course  LACERATION REPAIR Date/Time: 11/16/2013 4:15 PM Performed by: Fransico Meadow Authorized by: Fransico Meadow Consent: Verbal consent not obtained. Risks and benefits: risks, benefits and alternatives were discussed Consent given by: patient Patient understanding: patient states understanding of the procedure being performed Patient identity confirmed: verbally with patient Body area: head/neck Location details: nose Laceration length: 1 cm Foreign bodies: no foreign bodies Tendon involvement: none Nerve involvement: none Vascular damage: no Local anesthetic: lidocaine 2% without epinephrine Preparation: Patient was prepped and draped in the usual sterile fashion. Irrigation solution: saline Amount of cleaning: standard Debridement: minimal Skin closure: 5-0 Prolene Number of sutures: 5 Technique: simple Approximation: loose Approximation difficulty: simple Patient tolerance: Patient tolerated the procedure well with no immediate complications.   (including critical care time) Labs Review Labs Reviewed - No data to display  Imaging Review Ct Head Wo Contrast  11/16/2013   CLINICAL DATA:  Tripped and fell today with laceration along the bridge of the nose.  EXAM: CT HEAD WITHOUT CONTRAST  CT MAXILLOFACIAL WITHOUT CONTRAST  TECHNIQUE: Multidetector CT imaging of the head and maxillofacial structures were performed using the standard protocol without intravenous contrast. Multiplanar CT image reconstructions of the maxillofacial structures were also generated.  COMPARISON:  CT  head 06/24/2013.  FINDINGS: CT HEAD FINDINGS  No evidence of an acute infarct, acute hemorrhage, mass lesion, mass effect or hydrocephalus. Minimal atrophy and minimal periventricular low attenuation. Visualized portions of the paranasal sinuses are clear.  CT MAXILLOFACIAL FINDINGS  There are minimally displaced bilateral nasal bone fractures with overlying soft tissue swelling and subcutaneous emphysema. Rightward nasal septum deviation. No additional evidence of an acute fracture. Globes are intact. Soft tissues are otherwise unremarkable. Degenerative changes are incidentally imaged in the cervical spine.  IMPRESSION: 1. No acute intracranial abnormality. 2. Minimally displaced bilateral nasal bone fractures with overlying soft tissue swelling and subcutaneous emphysema. 3. Minimal atrophy and minimal chronic microvascular white matter ischemic changes.   Electronically Signed   By: Lorin Picket M.D.   On: 11/16/2013 14:42   Ct Maxillofacial Wo Cm  11/16/2013   CLINICAL DATA:  Tripped and fell today with laceration along the bridge of the nose.  EXAM: CT HEAD WITHOUT CONTRAST  CT MAXILLOFACIAL WITHOUT CONTRAST  TECHNIQUE: Multidetector CT imaging of the head and maxillofacial structures were performed using the standard protocol without intravenous contrast. Multiplanar CT image reconstructions of the maxillofacial structures were also generated.  COMPARISON:  CT head 06/24/2013.  FINDINGS: CT HEAD FINDINGS  No evidence of an acute infarct, acute hemorrhage, mass lesion, mass effect or hydrocephalus. Minimal atrophy and minimal periventricular low attenuation. Visualized portions of the paranasal sinuses are clear.  CT MAXILLOFACIAL FINDINGS  There are minimally displaced bilateral nasal bone fractures with overlying soft tissue swelling and subcutaneous emphysema. Rightward nasal septum deviation. No additional evidence of an acute fracture. Globes are intact. Soft tissues are otherwise unremarkable.  Degenerative changes are incidentally imaged in the cervical spine.  IMPRESSION: 1. No acute intracranial abnormality. 2. Minimally displaced bilateral nasal bone fractures with overlying soft tissue swelling and subcutaneous emphysema. 3. Minimal atrophy and minimal chronic microvascular white matter ischemic changes.   Electronically Signed   By: Lorin Picket M.D.   On: 11/16/2013 14:42     EKG Interpretation None      MDM   Final diagnoses:  Laceration of face without complication, initial encounter  Nasal fracture, closed, initial encounter    Hydrocodone Suture removal in 7 days  [place], PA-C 11/16/13 1616

## 2013-11-16 NOTE — ED Notes (Signed)
Laceration cart to bedside, supplies gathered for PA.

## 2013-11-16 NOTE — Discharge Instructions (Signed)
Nasal Fracture A nasal fracture is a break or crack in the bones of the nose. A minor break usually heals in a month. You often will receive black eyes from a nasal fracture. This is not a cause for concern. The black eyes will go away over 1 to 2 weeks.  DIAGNOSIS  Your caregiver may want to examine you if you are concerned about a fracture of the nose. X-rays of the nose may not show a nasal fracture even when one is present. Sometimes your caregiver must wait 1 to 5 days after the injury to re-check the nose for alignment and to take additional X-rays. Sometimes the caregiver must wait until the swelling has gone down. TREATMENT Minor fractures that have caused no deformity often do not require treatment. More serious fractures where bones are displaced may require surgery. This will take place after the swelling is gone. Surgery will stabilize and align the fracture. HOME CARE INSTRUCTIONS   Put ice on the injured area.  Put ice in a plastic bag.  Place a towel between your skin and the bag.  Leave the ice on for 15-20 minutes, 03-04 times a day.  Take medications as directed by your caregiver.  Only take over-the-counter or prescription medicines for pain, discomfort, or fever as directed by your caregiver.  If your nose starts bleeding, squeeze the soft parts of the nose against the center wall while you are sitting in an upright position for 10 minutes.  Contact sports should be avoided for at least 3 to 4 weeks or as directed by your caregiver. SEEK MEDICAL CARE IF:  Your pain increases or becomes severe.  You continue to have nosebleeds.  The shape of your nose does not return to normal within 5 days.  You have pus draining from the nose. SEEK IMMEDIATE MEDICAL CARE IF:   You have bleeding from your nose that does not stop after 20 minutes of pinching the nostrils closed and keeping ice on the nose.  You have clear fluid draining from your nose.  You notice a grape-like  swelling on the dividing wall between the nostrils (septum). This is a collection of blood (hematoma) that must be drained to help prevent infection.  You have difficulty moving your eyes.  You have recurrent vomiting. Document Released: 01/27/2000 Document Revised: 04/23/2011 Document Reviewed: 05/15/2010 [facility] Patient Information 2015 [place], [place]. This information is not intended to replace advice given to you by your health care provider. Make sure you discuss any questions you have with your health care provider. Sutured Wound Care Sutures are stitches that can be used to close wounds. Wound care helps prevent pain and infection.  HOME CARE INSTRUCTIONS   Rest and elevate the injured area until all the pain and swelling are gone.  Only take over-the-counter or prescription medicines for pain, discomfort, or fever as directed by your caregiver.  After 48 hours, gently wash the area with mild soap and water once a day, or as directed. Rinse off the soap. Pat the area dry with a clean towel. Do not rub the wound. This may cause bleeding.  Follow your caregiver's instructions for how often to change the bandage (dressing). Stop using a dressing after 2 days or after the wound stops draining.  If the dressing sticks, moisten it with soapy water and gently remove it.  Apply ointment on the wound as directed.  Avoid stretching a sutured wound.  Drink enough fluids to keep your urine clear or pale yellow.  Follow up with your caregiver for suture removal as directed.  Use sunscreen on your wound for the next 3 to 6 months so the scar will not darken. SEEK IMMEDIATE MEDICAL CARE IF:   Your wound becomes red, swollen, hot, or tender.  You have increasing pain in the wound.  You have a red streak that extends from the wound.  There is pus coming from the wound.  You have a fever.  You have shaking chills.  There is a bad smell coming from the wound.  You have persistent  bleeding from the wound. MAKE SURE YOU:   Understand these instructions.  Will watch your condition.  Will get help right away if you are not doing well or get worse. Document Released: 03/08/2004 Document Revised: 04/23/2011 Document Reviewed: 06/04/2010 [facility] Patient Information 2015 [place], [place]. This information is not intended to replace advice given to you by your health care provider. Make sure you discuss any questions you have with your health care provider.

## 2013-11-16 NOTE — ED Notes (Signed)
Pt has laceration to bridge of nose.  Pt fell onto concrete floor, caught herself with both arms and hit face.  Pt also c/o left sided chest discomfort upon arrival but has COPD.

## 2014-05-03 ENCOUNTER — Other Ambulatory Visit: Payer: Self-pay | Admitting: *Deleted

## 2014-05-03 ENCOUNTER — Other Ambulatory Visit: Payer: Self-pay

## 2014-05-03 NOTE — Patient Outreach (Signed)
RN spoke with pt today who indicates she is out of her home O2 and awaiting a repair from the [employer] agency. States she can use her portable tanks until her main concentrator is repaired. RN inquire on her recently prescribed respiratory medications if pt has obtained all that has been prescribed. Pt reports she has obtained the two medications for her ongoing nebulizer however intially her inhalers were $1000 each and pt indicated she could not afford these medications. Pt's pharmacy worked with pt and lowered the cost allowing pt to obtain 2 out of 3 inhalers ($65). Pt states the one not purchased was $100. Pt states Dr. Sabra Heck is aware and her next follow up visit is not until the Fall. RN inquired on the correct usage as pt expressed she is following directions as prescribed with one inhaler she uses three time daily and the other two times daily along with the two medications for her nebulizers.   Jean Mina, RN Care Management Coordinator [facility] Main Office [phone]

## 2014-05-05 ENCOUNTER — Other Ambulatory Visit: Payer: Self-pay

## 2014-05-05 NOTE — Patient Outreach (Addendum)
I called the patient to discuss having her nebulizing solutions filled through a home health agency and I had to leave a HIPPA compliant message for patient to return my phone call.  I will reach out on Friday, May 07, 2014 if the patient does not return my call today.  Deanne Coffer, PharmD, [facility] [phone]

## 2014-05-06 ENCOUNTER — Ambulatory Visit: Payer: Self-pay | Admitting: *Deleted

## 2014-05-10 ENCOUNTER — Other Ambulatory Visit: Payer: Self-pay

## 2014-05-10 NOTE — Patient Outreach (Signed)
I called Jean Mitchell to discuss setting up home visit to set up her nebulizing solutions being delivered by a home health agency.  She stated tomorrow at 11 am would be fine.  I will see her with Raina Mina, RN, her nurse care manager.

## 2014-05-11 ENCOUNTER — Other Ambulatory Visit: Payer: Self-pay

## 2014-05-11 ENCOUNTER — Other Ambulatory Visit: Payer: Self-pay | Admitting: *Deleted

## 2014-05-11 NOTE — Patient Outreach (Signed)
S:  Jean Mitchell is a 81 year old female who was referred to me by Jean Mina, RN, nurse care manager to assist with getting her medications.  I am in the home today with Jean Mitchell completing the visit.  I was able to work with Jean Mitchell to get her nebulizing solutions for albuterol, budesonide, and Performist since she was not willing to take her inhalers due to cost.  She had her nebulizing solutions filled at [facility] under her Medicare Part B insurance.  The albuterol was around $2, budesonide was around $60 and Performist was still going to be around $100.  She did not pick up the Performist up.  She does have a supplemental plan which should pick up the cost if she is able to get her nebulizing solutions from a home health agency.  She is interested in pursuing this.  In my home visit today, she stated she has been taken off all her medications except her nebulizing solutions, sertraline and clonazepam.  I called the provider's office to get an updated medication list as well as getting them to send prescriptions to [facility] to get the albuterol, budesonide and Performist. She also showed me a letter which stated she was approved for partial low income subsidy at 25%.  This should significantly help the cost of her medications.    A:1.   Medication Adherence: Jean Mitchell has been non-adherent to her medications mainly due to the cost of them.  If she is able to get her COPD nebulizing medications through [facility], she should be get better control of her COPD.  2.  Low income subsidy:  She has been approved for partial low income subsidy at 25%.  This will also help with adherence to medications.    P: [facility] CM Care Plan        Patient Outreach from 05/11/2014 in [place] Problem One  Medication adherence   Care Plan for Problem One  Active   Interventions for Problem One Long Term Goal  I called Jean Mitchell office to have the prescriptions for albuterol, performist  and budesonide sent to [facility] to all her to receive them at no cost through her insurnace.     [facility] Long Term Goal (31-90 days)  Jean Mitchell will be have all her nebulizing solutions delivered to her in the next 90 days evidence by patient reporting [facility] has delivered them   [place] Start Date  05/11/14   [facility] CM Short Term Goal #1 (0-30 days)  Jean Mitchell will obtain Performist from [facility] in the next 30 days by evidence of patient report.    [facility] CM Short Term Goal #1 Start Date  05/11/14     1.  I will follow up with Jean Mitchell in the next 2 weeks to determine if she was able to get her nebulizing solutions from [facility].   2.  I have updated Jean Mitchell on the need to have nebulizing solution prescriptions sent to [facility]. 3.  I have updated Jean Mina, RN on the pharmacy plan as well.   Jean Mitchell, PharmD, [place], [facility] Care Management [phone]

## 2014-05-11 NOTE — Patient Outreach (Addendum)
[facility]) Care Management   05/11/2014  Jean Mitchell September 08, 1927 [id number]  SUKANYA GOLDBLATT is an 81 y.o. female  Subjective:   Objective:   Review of Systems  Constitutional: Negative.   HENT: Negative.   Eyes: Negative.   Respiratory: Positive for sputum production and wheezing.        Pt reports sputum "foam" in color.  Cardiovascular: Negative.   Gastrointestinal: Negative.   Genitourinary: Negative.   Musculoskeletal: Negative.   Skin:       Note bruised area to right forehead and left lower leg from recent fall.   Neurological: Negative.   Endo/Heme/Allergies: Negative.   Psychiatric/Behavioral: Negative.     Physical Exam  Constitutional: She is oriented to person, place, and time. She appears well-developed.  HENT:  Head: Normocephalic.  Neck: Normal range of motion.  Limited in ROM and mobility  Cardiovascular: Normal rate and regular rhythm.   Respiratory: Effort normal. She has wheezes.  GI: Soft. Bowel sounds are normal.  Musculoskeletal: Normal range of motion.  Neurological: She is alert and oriented to person, place, and time.  Skin: Skin is warm and dry.  Psychiatric: She has a normal mood and affect. Her behavior is normal. Thought content normal.    Current Medications:   Current Outpatient Prescriptions  Medication Sig Dispense Refill  . ADVAIR DISKUS 100-50 MCG/DOSE AEPB INHALE 1 PUFF INTO THE LUNGS EVERY 12 HOURS. 60 each 11  . albuterol (PROAIR HFA) 108 (90 BASE) MCG/ACT inhaler Inhale 2 puffs into the lungs every 6 (six) hours as needed for wheezing. 8.5 g 11  . albuterol (PROVENTIL) (5 MG/ML) 0.5% nebulizer solution Take 0.5 mLs (2.5 mg total) by nebulization 3 (three) times daily. 20 mL 11  . Alum & Mag Hydroxide-Simeth (MAGIC MOUTHWASH) SOLN Take 5 mLs by mouth 3 (three) times daily as needed for mouth pain. 50 mL 0  . chlordiazePOXIDE (LIBRIUM) 5 MG capsule 1 cap in the morning, 1 in the afternoon, and 2 in the evening as  needed. 120 capsule 1  . clonazePAM (KLONOPIN) 0.5 MG tablet Take 1 tablet (0.5 mg total) by mouth 2 (two) times daily as needed for anxiety. 60 tablet 2  . docusate sodium 100 MG CAPS Take 100 mg by mouth 2 (two) times daily. 10 capsule   . ferrous sulfate 325 (65 FE) MG tablet Take 1 tablet (325 mg total) by mouth 3 (three) times daily after meals. 42 tablet 0  . fluticasone (FLONASE) 50 MCG/ACT nasal spray Place 2 sprays into the nose daily. 16 g 6  . HYDROcodone-acetaminophen (NORCO/VICODIN) 5-325 MG per tablet Take 1 tablet by mouth every 6 (six) hours as needed for moderate pain. 60 tablet 0  . HYDROcodone-acetaminophen (NORCO/VICODIN) 5-325 MG per tablet Take 2 tablets by mouth every 4 (four) hours as needed. 20 tablet 0  . HYDROcodone-acetaminophen (NORCO/VICODIN) 5-325 MG per tablet Take 2 tablets by mouth every 4 (four) hours as needed. 20 tablet 0  . nystatin (MYCOSTATIN) 100000 UNIT/ML suspension Take 5 mLs (500,000 Units total) by mouth 4 (four) times daily. 60 mL 1  . pravastatin (PRAVACHOL) 40 MG tablet Take 1 tablet (40 mg total) by mouth daily. 30 tablet 11  . sertraline (ZOLOFT) 100 MG tablet Take 1 tablet (100 mg total) by mouth daily. 90 tablet 3  . SPIRIVA HANDIHALER 18 MCG inhalation capsule INHALE THE CONTENTS OF 1 CAPSULE VIA HANDIHALER ONCE DAILY. 30 capsule 11  . tiZANidine (ZANAFLEX) 4 MG tablet Take  1 tablet (4 mg total) by mouth every 6 (six) hours as needed (spasms). 30 tablet 6  . zaleplon (SONATA) 10 MG capsule Take 10 mg by mouth at bedtime as needed for sleep.     No current facility-administered medications for this visit.    Functional Status:   No flowsheet data found.  Fall/Depression Screening:    PHQ 2/9 Scores 05/01/2013 01/27/2013  PHQ - 2 Score 0 0    Assessment:   Ongoing disease management related to COPD Smoking cessation related to [place] Ongoing saftey issues realted to recent fall Ongoing pharmacy issues related to [place]  involvement Non-adherent with use of Home O2 concentrator Home safety agencies related to safety in the home Plan:  Physical assessment completed with noted issues and interventions. Will discussed COPD action plan and verified pt's symptoms (YELLOW ZONE) today with some wheezing RLL, increased coughing with sputum.  Reviewed the COPD action plan and what to do if acute symptoms occur. RN also encouraged pt to wear her home O2 to prevent COPD flare ups and improve her oxygenation.  RN reminder pt to use her prescribed medications as recommended throughout the day with her nebulizer and encouraged pt to avoid triggers that may exacerbate her symptoms such as smoking. Offered QUIT SMOKING program while [facility] pharmacy is visiting today as pt will consider and decide at that time with Dawn Pettus  in the home (pt refused). Dawn Pettus via [place] nin the home today working with pt concerning her respiratory medications with pt's primary doctor and [place].  Strongly encouraged pt to ust her assisted devices when ambulating through the home with her cane to prevent future falls and/or injuries. Discussed safety alert agencies within the community for 24/7 monitoring in the home with a speaker phone device (will discussed with son prior to considering).  Due to recent falls with involved injuries RN contacted Dr. Kathyrn Lass (primary doctor) office and spoke with Manuela Schwartz concerning update on pt's progress and issues presented today concerning pt's fall, sats and pharmacy requested via [employer].  Plan to follow up with a home visit next month with ongoing disease management issues and continue to provider available community resources as needed. Plan of care has been discussed and goals.  Raina Mina, RN Care Management Coordinator [facility] Main Office [phone]

## 2014-05-11 NOTE — Patient Instructions (Signed)
Basics of Medication Management  UNDERSTAND YOUR MEDICATIONS   Read all of the labels and inserts that come with your medications. Review the information on this form often.   Know what potential side effects to look for (for each medication).   Know what each of your medications look like (by color, shape, size, stamp). If you are getting confused and having a hard time telling them apart, talk with your caregiver or pharmacist. They may be able to change the medication or help you to identify them more easily.   Check with your pharmacist if you notice a difference in the size or color of your medication.   Get all of your medications at one pharmacy. The pharmacist will have all of your information and understand possible drug interactions.   Ask your caregiver questions about your prescriptions and any over-the-counter medications, vitamins, herbal or dietary supplements that you take.  TAKE YOUR MEDICATION SAFELY   Take medications only as prescribed.   Talk with your caregiver or pharmacist if some of your pills look the same and it is difficult to tell them apart. They can help you to recognize different medications.   Never double up on your medication.   Never take anyone else's medication or share your medications.   Do not stop taking your medication(s) unless you have discussed it with your caregiver.   Do not split, mash, or chew medications unless your caregiver tells you to do so. Tell your caregiver if you have trouble swallowing your medication(s).   For liquid medications, make sure you use the dosing container provided to you.   You may need to avoid alcohol or certain foods or liquids with one or more of your medications. Make sure you remember how to take each medication with some of the tools below.  ORGANIZE YOUR MEDICATIONS   Use a tool, such as a weekly pill box (available at your local pharmacy), written chart from your caregiver, notebook, binder, or your own calendar to  organize your daily medications. Please note: if you are having trouble telling your different medications apart, keep them in the original bottles.   Set cues or reminders for taking your medications. Use watch alarms, mobile device/phone calendar alarms, or sticky notes.   More advanced medication management systems are also available. These offer weekly or monthly options complete with storage, alarms, and visual and audio prompts.   Your system should help you to keep track of the:   Name of medication and dose.   Day.   Time.   Pill to take (by color, shape, size, or name/imprint).   How to take it (with or without certain foods, on an empty stomach, with fluids etc.).   Review your medication schedule with a family member or friend to help you. Other household members should understand your medications.   If you are taking medications on an "as needed" basis such as those for nausea or pain, write down the name, dose, and time you took the medication so that you remember what you have taken.  PLAN AHEAD FOR REFILLS AND TRAVEL   Take your pill box, medications, and calendar system with you when you travel.   Plan ahead for refills as to not run out of your medication(s).   Always carry an updated list of your medications with you. If there is an emergency, a respondent can quickly see what medications you are taking.  STORE AND DISCARD YOUR MEDICATIONS SAFELY   Store medications in   medications.  Keep medications out of children's reach, away from counters and bedside tables. Store them up high in cabinets or shelves.  Check expiration dates regularly.  Learn about the best way to dispose of each medication you take. Find out if your local government recycling program has a Medicine Take Back  program for safe disposal. If not, some medications may be mixed with inedible substances and thrown away in the trash. Certain medications are to be flushed down the toilet. REMEMBER:  Tell your caregiver if you experience side effects, new symptoms, or have other concerns. There may be dosing changes or alternative medications that would be better for you.  Review your medications regularly with your caregiver. Check to see if you need to continue to take each medication, and discuss how well they are working. Medicines, diet, medical conditions, weight changes, and other habits can all affect how medicines work.  Document Released: 05/16/2010 Document Revised: 04/23/2011 Document Reviewed: 05/16/2010 [facility] Patient Information 2015 [place], [place]. This information is not intended to replace advice given to you by your health care provider. Make sure you discuss any questions you have with your health care provider.

## 2014-05-11 NOTE — Patient Instructions (Signed)
Basics of Medication Management  UNDERSTAND YOUR MEDICATIONS   Read all of the labels and inserts that come with your medications. Review the information on this form often.   Know what potential side effects to look for (for each medication).   Know what each of your medications look like (by color, shape, size, stamp). If you are getting confused and having a hard time telling them apart, talk with your caregiver or pharmacist. They may be able to change the medication or help you to identify them more easily.   Check with your pharmacist if you notice a difference in the size or color of your medication.   Get all of your medications at one pharmacy. The pharmacist will have all of your information and understand possible drug interactions.   Ask your caregiver questions about your prescriptions and any over-the-counter medications, vitamins, herbal or dietary supplements that you take.  TAKE YOUR MEDICATION SAFELY   Take medications only as prescribed.   Talk with your caregiver or pharmacist if some of your pills look the same and it is difficult to tell them apart. They can help you to recognize different medications.   Never double up on your medication.   Never take anyone else's medication or share your medications.   Do not stop taking your medication(s) unless you have discussed it with your caregiver.   Do not split, mash, or chew medications unless your caregiver tells you to do so. Tell your caregiver if you have trouble swallowing your medication(s).   For liquid medications, make sure you use the dosing container provided to you.   You may need to avoid alcohol or certain foods or liquids with one or more of your medications. Make sure you remember how to take each medication with some of the tools below.  ORGANIZE YOUR MEDICATIONS   Use a tool, such as a weekly pill box (available at your local pharmacy), written chart from your caregiver, notebook, binder, or your own calendar to  organize your daily medications. Please note: if you are having trouble telling your different medications apart, keep them in the original bottles.   Set cues or reminders for taking your medications. Use watch alarms, mobile device/phone calendar alarms, or sticky notes.   More advanced medication management systems are also available. These offer weekly or monthly options complete with storage, alarms, and visual and audio prompts.   Your system should help you to keep track of the:   Name of medication and dose.   Day.   Time.   Pill to take (by color, shape, size, or name/imprint).   How to take it (with or without certain foods, on an empty stomach, with fluids etc.).   Review your medication schedule with a family member or friend to help you. Other household members should understand your medications.   If you are taking medications on an "as needed" basis such as those for nausea or pain, write down the name, dose, and time you took the medication so that you remember what you have taken.  PLAN AHEAD FOR REFILLS AND TRAVEL   Take your pill box, medications, and calendar system with you when you travel.   Plan ahead for refills as to not run out of your medication(s).   Always carry an updated list of your medications with you. If there is an emergency, a respondent can quickly see what medications you are taking.  STORE AND DISCARD YOUR MEDICATIONS SAFELY   Store medications in   medications.  Keep medications out of children's reach, away from counters and bedside tables. Store them up high in cabinets or shelves.  Check expiration dates regularly.  Learn about the best way to dispose of each medication you take. Find out if your local government recycling program has a Medicine Take Back  program for safe disposal. If not, some medications may be mixed with inedible substances and thrown away in the trash. Certain medications are to be flushed down the toilet. REMEMBER:  Tell your caregiver if you experience side effects, new symptoms, or have other concerns. There may be dosing changes or alternative medications that would be better for you.  Review your medications regularly with your caregiver. Check to see if you need to continue to take each medication, and discuss how well they are working. Medicines, diet, medical conditions, weight changes, and other habits can all affect how medicines work.  Document Released: 05/16/2010 Document Revised: 04/23/2011 Document Reviewed: 05/16/2010 [facility] Patient Information 2015 [place], [place]. This information is not intended to replace advice given to you by your health care provider. Make sure you discuss any questions you have with your health care provider.

## 2014-05-17 ENCOUNTER — Ambulatory Visit (INDEPENDENT_AMBULATORY_CARE_PROVIDER_SITE_OTHER): Payer: Medicare Other | Admitting: Pulmonary Disease

## 2014-05-17 ENCOUNTER — Encounter: Payer: Self-pay | Admitting: Pulmonary Disease

## 2014-05-17 ENCOUNTER — Ambulatory Visit (INDEPENDENT_AMBULATORY_CARE_PROVIDER_SITE_OTHER)
Admission: RE | Admit: 2014-05-17 | Discharge: 2014-05-17 | Disposition: A | Discharge status: Home / Self Care | Payer: Medicare Other | Source: Ambulatory Visit | Attending: Pulmonary Disease | Admitting: Pulmonary Disease

## 2014-05-17 VITALS — BP 122/80 | HR 76 | Temp 98.5°F | Ht 65.0 in | Wt 105.2 lb

## 2014-05-17 DIAGNOSIS — Z720 Tobacco use: Secondary | ICD-10-CM

## 2014-05-17 DIAGNOSIS — C50919 Malignant neoplasm of unspecified site of unspecified female breast: Secondary | ICD-10-CM

## 2014-05-17 DIAGNOSIS — F32A Depression, unspecified: Secondary | ICD-10-CM

## 2014-05-17 DIAGNOSIS — F329 Major depressive disorder, single episode, unspecified: Secondary | ICD-10-CM

## 2014-05-17 DIAGNOSIS — J9611 Chronic respiratory failure with hypoxia: Secondary | ICD-10-CM

## 2014-05-17 DIAGNOSIS — C210 Malignant neoplasm of anus, unspecified: Secondary | ICD-10-CM

## 2014-05-17 DIAGNOSIS — J439 Emphysema, unspecified: Secondary | ICD-10-CM

## 2014-05-17 DIAGNOSIS — F419 Anxiety disorder, unspecified: Secondary | ICD-10-CM

## 2014-05-17 DIAGNOSIS — F17210 Nicotine dependence, cigarettes, uncomplicated: Secondary | ICD-10-CM

## 2014-05-17 MED ORDER — IPRATROPIUM-ALBUTEROL 0.5-2.5 (3) MG/3ML IN SOLN: 3.0000 mL | Freq: Three times a day (TID) | RESPIRATORY_TRACT | Status: DC

## 2014-05-17 MED ORDER — ALBUTEROL SULFATE HFA 108 (90 BASE) MCG/ACT IN AERS: 2.0000 | INHALATION_SPRAY | RESPIRATORY_TRACT | Status: DC | PRN

## 2014-05-17 NOTE — Patient Instructions (Signed)
Today we updated your med list in our EPIC system...    We decided to change your NEBULIZER medication to a combination med called DUONEB...    Use it the same as your previous med- THREE TIMES DAILY in your nebulizer...  It is imperative that you quit smoking completely!!!    It is never too late to quit...        YOU CAN DO IT - try the nicotine patches or gum or lozenges avail OTC...  Call for any questions...  Let's plan a follow up visit in 84mo, sooner if needed for problems.Marland KitchenMarland Kitchen

## 2014-05-17 NOTE — Progress Notes (Signed)
Subjective:     Patient ID: Jean Mitchell, female   DOB: [birth year], 81 y.o.   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 Kathyrn Lass at [facility], due to dyspnea>  [name] is a current smoker- starting later in life around 1970 (at age 81), and never up to 1ppd she says, currently smoking 4-5 cig/d and notes that she quit off & on over the yrs, est <40 pack-yrs overall... She c/o SOB/ DOE w/ min activ & ADLs (but blames her hips not her breathing), states she's able to get about the house/ cook/ do laundry & personal care needs, but can't clean/ walk any distance/ stairs/ etc...  She notes cough prod of sm amt thick white sputum (no color or blood); occas wheezing; denies CP/ palpit/ edema... She was hosp w/ pneumonia around 2005 & was in the ICU at [facility], disch on oxygen (and has been on this for the last 10 yrs- uses it Qhs); told she has COPD but denies freq exacerbations, antibiotics, prednisone, etc... Records from [facility] shows COPD but we do not have prev PFTs to review; on NEBS w/ albut Tid, Spiriva respimat- 2 inhalations daily, Combivent respimat prn & home oxygen but she has trouble using the respimat & can't afford her meds from Pharm...   EXAM shows Afeb, VSS, O2sat=93% on RA at rest; Chest reveals decr BS at bases, few rhonchi, no wheezing or signs of consolidation; Cardia reveals an epigastric displaced PMI & gr2/6 SEM (AS), no rubs or gallops detected; Ext- bruise on left leg w/ sm hematoma from trauma...  We reviewed prob list, meds, xrays and labs>   2DEcho 08/2009 showed norm LV size & function w/ EF=55-60%, normal wall motion, mild AS, mild pulmHTN w/ PAsys=35mmHg...   CXR 05/2014 (I personally reviewed the images) showed mild cardiomeg, coronary calcif & atherosclerosis in Ao; COPD/ emphysema but NAD; spinal arthritis/ mult compression fxs & augmentations...  Spirometry  05/2014 (interpreted by myself) showed FVC=2.13 (79%), FEV1=0.85 (44%), %1sec=40, mid-flows=20% predicted; c/w severe airflow obstruction and GOLD Stage 3 COPD...  Ambulatory oxygen test> O2sat=96% on RA at rest w/ pulse=74/min;  She walked 3 laps w/ lowest O2sat=92% w/ pulse 96/min  LABS in EPIC reviewed by me...  PLAN>> we discussed the IMPERATIVE to quit smoking completely- discussed nicotine replacement options, Chantix, etc (she has Klonopin 0.5mg  & Sertraline 100mg  already);  We discussed switching her inhaled meds to NEBULIZED DUONEB Tid regularly, and ProairHFA when out & about;  She does not need antibiotics or Pred at this time;  I would like to recheck her in several months, sooner if needed for any reason...    Past Medical History  Diagnosis Date  . Breast cancer 2007    lumpectomy, XRT previously on Tamoxifen   . Hyperlipidemia   . CHF (congestive heart failure)   . COPD (chronic obstructive pulmonary disease)     o2 at night  . Depression   . AAA (abdominal aortic aneurysm)   . Back pain     lumbar, chronic  . Hypertension   . Osteoporosis     dexa 02-18-08  . Insomnia   . Esophageal stricture   . Adenomatous colon polyp   . Hip fracture 08/2009    R, s/p ORIF  . Arthritis   . Ulcer   . Heart disease   . Diverticulosis   . Anxiety 02/15/2011  .  Impaired glucose tolerance 02/18/2011  . Internal hemorrhoids   . 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 Presbyesophagus   . Gastritis   . Tubular adenoma   . Breast cancer    Past Surgical History  Procedure Laterality Date  . Oophorectomy      unilateral  . Knee arthroscopy      right   . Hemorrhoid surgery  1965  . Mastectomy, partial  2007    left  . Abdominal hysterectomy  1968  . Tonsillectomy  1968  . Rotator cuff repair    . Anal carcinoma resection    . Kyphosis surgery      balloon  . Cesarean section      x 2  . Hip fracture surgery  2011    right with plate with persistent weakness of the RLE  .  Femur im nail  03/17/2012    Procedure: INTRAMEDULLARY (IM) NAIL FEMORAL;  Surgeon: Mauri Pole, MD;  Location: [facility];  Service: Orthopedics;  Laterality: Left;    Outpatient Encounter Prescriptions as of 05/17/2014  Medication Sig  . albuterol (PROAIR HFA) 108 (90 BASE) MCG/ACT inhaler Inhale 2 puffs into the lungs every 6 (six) hours as needed for wheezing.  Marland Kitchen albuterol (PROVENTIL) (5 MG/ML) 0.5% nebulizer solution Take 0.5 mLs (2.5 mg total) by nebulization 3 (three) times daily.  . clonazePAM (KLONOPIN) 0.5 MG tablet Take 1 tablet (0.5 mg total) by mouth 2 (two) times daily as needed for anxiety.  . pravastatin (PRAVACHOL) 40 MG tablet Take 1 tablet (40 mg total) by mouth daily.  . sertraline (ZOLOFT) 100 MG tablet Take 1 tablet (100 mg total) by mouth daily.   Allergies  Allergen Reactions  . Gabapentin Hives    whelps  . Ezetimibe-Simvastatin     unknown  . Percocet [Oxycodone-Acetaminophen] Nausea And Vomiting    5/325 mg   Family History  Problem Relation Age of Onset  . Heart disease Mother   . Cancer Mother     bladder  . Colon cancer Father 62  . Heart disease Sister   . Breast cancer Sister   . Colon polyps Sister    History   Social History  . Marital Status: Divorced    Spouse Name: N/A  . Number of Children: 2  . Years of Education: N/A   Occupational History  . retired    Social History Main Topics  . Smoking status: Current Every Day Smoker -- 0.50 packs/day for 60 years    Types: Cigarettes  . Smokeless tobacco: Never Used  . Alcohol Use: No  . Drug Use: No  . Sexual Activity: No   Other Topics Concern  . Not on file   Social History Narrative   Lives by herself--son and granddaughter brings food, limited ADL since 7/11--- still drives.  She has a cane and walker at home, which she does not use.     Current Medications, Allergies, Past Medical History, Past Surgical History, Family History, and Social History were reviewed in [facility] record.   Review of Systems    Constitutional:  Denies F/C/S, anorexia, unexpected weight change. HEENT:  No HA, visual changes, earache, nasal symptoms, sore throat, hoarseness. Resp:  +cough&sputum, no hemoptysis; no SOB, tightness, wheezing. Cardio:  No CP, palpit, +DOE, no orthopnea, tr edema. GI:  Denies N/V/D/C or blood in stool; no reflux, abd pain, distention, or gas. GU:  No dysuria, freq, urgency, hematuria, or flank pain. MS:  +  joint pain/swelling, +tenderness & decr ROM; no neck pain, +back pain w/ mult augmentations. Neuro:  No tremors, seizures, dizziness, syncope, weakness, numbness, +gait abn. Skin:  No suspicious lesions or skin rash, +bruising Heme:  No adenopathy, +bruising, no bleeding. Psyche: Denies confusion, sleep disturbance, hallucinations, anxiety, depression.   Objective:   Physical Exam    Vital Signs:  Reviewed...  General:  Thin elderly frail 81 y/o WF in NAD; alert & oriented; pleasant & cooperative... HEENT:  Ayr/AT; Conjunctiva- pink, Sclera- nonicteric, EOM-wnl, PERRLA, EACs-clear, TMs-wnl; NOSE-clear; THROAT-clear & wnl. Neck:  Supple w/ decr ROM; no JVD; normal carotid impulses w/o bruits; no thyromegaly or nodules palpated; no lymphadenopathy. Chest:  decr BS at bases, few rhonchi, no wheezing or signs of consolidation Heart:  Regular Rhythm; norm S1 & S2 epigastric displaced PMI & gr2/6 SEM (AS), no rubs or gallops detected Abdomen:  Soft & nontender- no guarding or rebound; normal bowel sounds; no organomegaly or masses palpated. Ext:  Decreased ROM deformities & arthritic changes; no varicose veins, +venous insuffic, tr edema;  Pulses intact w/o bruits. Neuro:  CNs II-XII intact; motor testing normal; sensory testing OK & + gait abn present w/ balance ok. Derm:  No lesions noted; no rash etc. Lymph:  No cervical, supraclavicular, axillary, or inguinal adenopathy palpated.   Assessment:      She has severe airflow  obstruction, GOLD Stage 3 COPD/ emphysema, & hx mild pulmHTN per 2DEcho in 2011... And she still smokes despite being on home O2 for the last 87yrs! We discussed the IMPERATIVE to quit smoking completely- discussed nicotine replacement options, Chantix, etc (she has Klonopin 0.5mg  & Sertraline 100mg  already);  We discussed switching her inhaled meds to NEBULIZED DUONEB Tid regularly, and ProairHFA when out & about;  She does not need antibiotics or Pred at this time;  I would like to recheck her in several months, sooner if needed for any reason     Plan:     Patient's Medications  New Prescriptions   ALBUTEROL (PROAIR HFA) 108 (90 BASE) MCG/ACT INHALER    Inhale 2 puffs into the lungs every 4 (four) hours as needed for wheezing or shortness of breath.   IPRATROPIUM-ALBUTEROL (DUONEB) 0.5-2.5 (3) MG/3ML SOLN    Take 3 mLs by nebulization 3 (three) times daily.  Previous Medications   ALBUTEROL (PROAIR HFA) 108 (90 BASE) MCG/ACT INHALER    Inhale 2 puffs into the lungs every 6 (six) hours as needed for wheezing.   ALBUTEROL (PROVENTIL) (5 MG/ML) 0.5% NEBULIZER SOLUTION    Take 0.5 mLs (2.5 mg total) by nebulization 3 (three) times daily.   CLONAZEPAM (KLONOPIN) 0.5 MG TABLET    Take 1 tablet (0.5 mg total) by mouth 2 (two) times daily as needed for anxiety.   PRAVASTATIN (PRAVACHOL) 40 MG TABLET    Take 1 tablet (40 mg total) by mouth daily.   SERTRALINE (ZOLOFT) 100 MG TABLET    Take 1 tablet (100 mg total) by mouth daily.  Modified Medications   No medications on file  Discontinued Medications   ADVAIR DISKUS 100-50 MCG/DOSE AEPB    INHALE 1 PUFF INTO THE LUNGS EVERY 12 HOURS.   ALUM & MAG HYDROXIDE-SIMETH (MAGIC MOUTHWASH) SOLN    Take 5 mLs by mouth 3 (three) times daily as needed for mouth pain.   BUDESONIDE (PULMICORT) 0.25 MG/2ML NEBULIZER SOLUTION    Take 0.25 mg by nebulization 2 (two) times daily.   CHLORDIAZEPOXIDE (LIBRIUM) 5 MG CAPSULE  1 cap in the morning, 1 in the afternoon,  and 2 in the evening as needed.   DOCUSATE SODIUM 100 MG CAPS    Take 100 mg by mouth 2 (two) times daily.   FERROUS SULFATE 325 (65 FE) MG TABLET    Take 1 tablet (325 mg total) by mouth 3 (three) times daily after meals.   FLUTICASONE (FLONASE) 50 MCG/ACT NASAL SPRAY    Place 2 sprays into the nose daily.   FORMOTEROL (PERFOROMIST) 20 MCG/2ML NEBULIZER SOLUTION    Take 20 mcg by nebulization 2 (two) times daily.   HYDROCODONE-ACETAMINOPHEN (NORCO/VICODIN) 5-325 MG PER TABLET    Take 1 tablet by mouth every 6 (six) hours as needed for moderate pain.   HYDROCODONE-ACETAMINOPHEN (NORCO/VICODIN) 5-325 MG PER TABLET    Take 2 tablets by mouth every 4 (four) hours as needed.   HYDROCODONE-ACETAMINOPHEN (NORCO/VICODIN) 5-325 MG PER TABLET    Take 2 tablets by mouth every 4 (four) hours as needed.   NYSTATIN (MYCOSTATIN) 100000 UNIT/ML SUSPENSION    Take 5 mLs (500,000 Units total) by mouth 4 (four) times daily.   SENNA (SENOKOT) 8.6 MG TABS TABLET    Take 1 tablet by mouth daily as needed for mild constipation.   SPIRIVA HANDIHALER 18 MCG INHALATION CAPSULE    INHALE THE CONTENTS OF 1 CAPSULE VIA HANDIHALER ONCE DAILY.   TIZANIDINE (ZANAFLEX) 4 MG TABLET    Take 1 tablet (4 mg total) by mouth every 6 (six) hours as needed (spasms).   ZALEPLON (SONATA) 10 MG CAPSULE    Take 10 mg by mouth at bedtime as needed for sleep.

## 2014-06-03 ENCOUNTER — Ambulatory Visit: Payer: Self-pay | Admitting: *Deleted

## 2014-06-03 ENCOUNTER — Encounter: Payer: Self-pay | Admitting: *Deleted

## 2014-06-03 ENCOUNTER — Other Ambulatory Visit: Payer: Self-pay | Admitting: *Deleted

## 2014-06-03 NOTE — Patient Outreach (Signed)
[facility]) Care Management   06/03/2014  Jean Mitchell [birth year] [id number]  Jean Mitchell is an 81 y.o. female  Subjective:  Pt reports she did pick of or follow through with obtaining the medication and resources provided on last home visit with the [place] and RN. Pt reports she obtained an appointment with Dr. Lenna Gilford and reports new solutions for her nebulizer. Pt states she is not taking the Spiriva and Combivent.  Note several resources provided via [facility] to obtain these inhaler however pt declined to obtain these medications. Pt has confirmed she is using the new nebulizers as noted on the medication review as instructed by Dr. Lenna Gilford with no additional breathing problems or distress. Next follow up appointment  In July and no appointment pending with Dr. Sabra Heck (pt states next fall). Pt refused ongoing telephonic disease management via [place] she is able to manage her care independently.   Objective:   Review of Systems  Constitutional: Negative.   HENT: Negative.   Eyes: Negative.   Respiratory: Positive for sputum production.   Cardiovascular: Negative.   Gastrointestinal: Positive for constipation.  Genitourinary: Negative.   Musculoskeletal: Negative.   Skin: Negative.   Neurological: Negative.   Endo/Heme/Allergies: Negative.   Psychiatric/Behavioral: Negative.     Physical Exam  Constitutional: She is oriented to person, place, and time. She appears well-developed and well-nourished.  HENT:  Right Ear: External ear normal.  Left Ear: External ear normal.  Nose: Nose normal.  Neck: Normal range of motion.  Cardiovascular: Normal rate and normal heart sounds.   Respiratory: Effort normal and breath sounds normal.  GI: Soft. Bowel sounds are normal.  Musculoskeletal: Normal range of motion.  Neurological: She is alert and oriented to person, place, and time.  Skin: Skin is warm and dry.  Psychiatric: She has a normal mood and  affect. Her behavior is normal. Judgment and thought content normal.    Current Medications:   Current Outpatient Prescriptions  Medication Sig Dispense Refill  . albuterol (PROAIR HFA) 108 (90 BASE) MCG/ACT inhaler Inhale 2 puffs into the lungs every 6 (six) hours as needed for wheezing. 8.5 g 11  . albuterol (PROVENTIL) (2.5 MG/3ML) 0.083% nebulizer solution Take 2.5 mg by nebulization every 6 (six) hours as needed for wheezing or shortness of breath.    . budesonide (PULMICORT) 0.5 MG/2ML nebulizer solution Take 0.5 mg by nebulization 2 (two) times daily.    . clonazePAM (KLONOPIN) 0.5 MG tablet Take 1 tablet (0.5 mg total) by mouth 2 (two) times daily as needed for anxiety. 60 tablet 2  . ipratropium (ATROVENT) 0.02 % nebulizer solution Take 0.5 mg by nebulization 4 (four) times daily. Ipratropium bromide and albuterol sulfate 0.5mg /3mg  per 3 ml    . pravastatin (PRAVACHOL) 40 MG tablet Take 1 tablet (40 mg total) by mouth daily. 30 tablet 11  . sertraline (ZOLOFT) 100 MG tablet Take 1 tablet (100 mg total) by mouth daily. 90 tablet 3  . albuterol (PROAIR HFA) 108 (90 BASE) MCG/ACT inhaler Inhale 2 puffs into the lungs every 4 (four) hours as needed for wheezing or shortness of breath. (Patient not taking: Reported on 06/03/2014) 1 Inhaler 3  . albuterol (PROVENTIL) (5 MG/ML) 0.5% nebulizer solution Take 0.5 mLs (2.5 mg total) by nebulization 3 (three) times daily. (Patient not taking: Reported on 06/03/2014) 20 mL 11  . ipratropium-albuterol (DUONEB) 0.5-2.5 (3) MG/3ML SOLN Take 3 mLs by nebulization 3 (three) times daily. (Patient not  taking: Reported on 06/03/2014) 360 mL 3   No current facility-administered medications for this visit.    Functional Status:   In your present state of health, do you have any difficulty performing the following activities: 05/11/2014  Hearing? N  Vision? N  Difficulty concentrating or making decisions? N  Walking or climbing stairs? Y  Dressing or bathing?  N  Doing errands, shopping? Y  Preparing Food and eating ? Y  Using the Toilet? Y  In the past six months, have you accidently leaked urine? N  Do you have problems with loss of bowel control? N  Managing your Medications? Y  Managing your Finances? Y  Housekeeping or managing your Housekeeping? N    Fall/Depression Screening:    PHQ 2/9 Scores 05/11/2014 05/01/2013 01/27/2013  PHQ - 2 Score 0 0 0    Assessment:   Follow up on medical adherence  Disease management related to COPD  Plan:  Physical assessment completed with no acute issues found on today's assessment. Confirmed pt has not filled all her previous medication within the allowed timeframe in working with [facility] with the available resurces that has been presented. Confirmed pt has(Dr .Lenna Gilford established a provider to address her pulmonology issues (Dr. Lenna Gilford). Pt confirmed new nebulizer solutions updated in medications review and all other medication presented and confirmed.  Verified pt is in the GREEN zone concerning her COPD action plan. RN review the COPD action plan once again for pt's understanding and addressed any inquires or questions during today's home visit.  Note RN and [facility] pharmacy has attempted to work with pt several months concerning her medications and education concerning the importance of adherence with managing her COPD. Due to pt's past history of non-adherence even with the available resources presented pt is aware she will be discharged via [facility] services. All other goals met concerning pt's plan of care.  RN will notify Dr. Kathyrn Lass (primary) pt's discharge today via [facility] services.  Raina Mina, RN Care Management Coordinator [facility] Main Office [phone]

## 2014-06-21 ENCOUNTER — Telehealth: Payer: Self-pay | Admitting: Pulmonary Disease

## 2014-06-21 NOTE — Telephone Encounter (Signed)
Spoke with pharmacist, states that a form is being faxed over for SN to sign re: pt's neb meds.  This needs to have ICD 10 dx code and providers signature on it and faxed back to [facility].  This fax # is in epic under [employer] listed.  Verified main fax #.    Will forward this to Apolonio Schneiders to look out for.

## 2014-06-23 NOTE — Telephone Encounter (Signed)
Have you received this fax? Thanks.

## 2014-06-23 NOTE — Telephone Encounter (Signed)
Form received. ICD-10 code added and signed by SN. Order faxed to [phone]. Nothing further is needed at this time.

## 2014-06-29 NOTE — Patient Outreach (Signed)
[facility]) Care Management  06/29/2014  ROXSANA RIDING [birth year] [id number]   Documentation that the Pharmacy team is no longer involved in Ms. Lumley's care.  We are available on an as needed basis.   Deanne Coffer, PharmD, [place] [phone]

## 2014-07-28 ENCOUNTER — Ambulatory Visit (HOSPITAL_COMMUNITY)
Admission: RE | Admit: 2014-07-28 | Discharge: 2014-07-28 | Disposition: A | Discharge status: Home / Self Care | Payer: Medicare Other | Source: Ambulatory Visit | Attending: Cardiology | Admitting: Cardiology

## 2014-07-28 ENCOUNTER — Other Ambulatory Visit (HOSPITAL_COMMUNITY): Payer: Self-pay | Admitting: Orthopedic Surgery

## 2014-07-28 DIAGNOSIS — S8012XD Contusion of left lower leg, subsequent encounter: Secondary | ICD-10-CM | POA: Diagnosis not present

## 2014-07-28 DIAGNOSIS — S8012XA Contusion of left lower leg, initial encounter: Secondary | ICD-10-CM | POA: Diagnosis not present

## 2014-07-28 DIAGNOSIS — X58XXXD Exposure to other specified factors, subsequent encounter: Secondary | ICD-10-CM | POA: Diagnosis not present

## 2014-08-05 ENCOUNTER — Telehealth (HOSPITAL_COMMUNITY): Payer: Self-pay | Admitting: *Deleted

## 2014-08-05 ENCOUNTER — Encounter: Payer: Self-pay | Admitting: Internal Medicine

## 2014-08-17 ENCOUNTER — Ambulatory Visit (INDEPENDENT_AMBULATORY_CARE_PROVIDER_SITE_OTHER): Payer: Medicare Other | Admitting: Pulmonary Disease

## 2014-08-17 ENCOUNTER — Encounter: Payer: Self-pay | Admitting: Pulmonary Disease

## 2014-08-17 VITALS — BP 108/64 | HR 95 | Temp 98.3°F | Wt 110.6 lb

## 2014-08-17 DIAGNOSIS — J9611 Chronic respiratory failure with hypoxia: Secondary | ICD-10-CM

## 2014-08-17 DIAGNOSIS — J439 Emphysema, unspecified: Secondary | ICD-10-CM

## 2014-08-17 DIAGNOSIS — F17210 Nicotine dependence, cigarettes, uncomplicated: Secondary | ICD-10-CM

## 2014-08-17 DIAGNOSIS — F419 Anxiety disorder, unspecified: Secondary | ICD-10-CM | POA: Diagnosis not present

## 2014-08-17 DIAGNOSIS — Z720 Tobacco use: Secondary | ICD-10-CM

## 2014-08-17 MED ORDER — BUDESONIDE 0.5 MG/2ML IN SUSP: 0.5000 mg | Freq: Two times a day (BID) | RESPIRATORY_TRACT | Status: DC

## 2014-08-17 NOTE — Progress Notes (Signed)
Subjective:     Patient ID: Jean Mitchell, female   DOB: January 29, 1928, 81 y.o.   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 Kathyrn Lass at [facility], due to dyspnea>  Jean Mitchell is a current smoker- starting later in life around 1970 (at age 75), and never up to 1ppd she says, currently smoking 4-5 cig/d and notes that she quit off & on over the yrs, est <40 pack-yrs overall... She c/o SOB/ DOE w/ min activ & ADLs (but blames her hips not her breathing), states she's able to get about the house/ cook/ do laundry & personal care needs, but can't clean/ walk any distance/ stairs/ etc...  She notes cough prod of sm amt thick white sputum (no color or blood); occas wheezing; denies CP/ palpit/ edema... She was hosp w/ pneumonia around 2005 & was in the ICU at [facility], disch on oxygen (and has been on this for the last 10 yrs- uses it Qhs); told she has COPD but denies freq exacerbations, antibiotics, prednisone, etc... Records from [facility] shows COPD but we do not have prev PFTs to review; on NEBS w/ albut Tid, Spiriva respimat- 2 inhalations daily, Combivent respimat prn & home oxygen but she has trouble using the respimat & can't afford her meds from Pharm...       EXAM shows Afeb, VSS, O2sat=93% on RA at rest; Chest reveals decr BS at bases, few rhonchi, no wheezing or signs of consolidation; Cardia reveals an epigastric displaced PMI & gr2/6 SEM (AS), no rubs or gallops detected; Ext- bruise on left leg w/ sm hematoma from trauma... We reviewed prob list, meds, xrays and labs>   2DEcho 08/2009 showed norm LV size & function w/ EF=55-60%, normal wall motion, mild AS, mild pulmHTN w/ PAsys=70mmHg...   CXR 05/2014 (I personally reviewed the images) showed mild cardiomeg, coronary calcif & atherosclerosis in Ao; COPD/ emphysema but NAD; spinal arthritis/ mult  compression fxs & augmentations...  Spirometry 05/2014 (interpreted by myself) showed FVC=2.13 (79%), FEV1=0.85 (44%), %1sec=40, mid-flows=20% predicted; c/w severe airflow obstruction and GOLD Stage 3 COPD...  Ambulatory oxygen test> O2sat=96% on RA at rest w/ pulse=74/min;  She walked 3 laps w/ lowest O2sat=92% w/ pulse 96/min  LABS in EPIC reviewed by me... PLAN>> we discussed the IMPERATIVE to quit smoking completely- discussed nicotine replacement options, Chantix, etc (she has Klonopin 0.5mg  & Sertraline 100mg  already);  We discussed switching her inhaled meds to NEBULIZED DUONEB Tid regularly, and ProairHFA when out & about;  She does not need antibiotics or Pred at this time;  I would like to recheck her in several months, sooner if needed for any reason...   ~  August 17, 2014:  13mo ROV w/ SN> Jean Mitchell reports that her breathing is stable but she is confused about her NEB meds, we did NEBs to save her $$ on Medicare partD drugs, supposed to be on Albut, Ipratrop, Budes but she quit 3 weeks ago- ?why?  States she had swollen left leg, saw DrOlin & told "hematoma" (she had neg ven dopplers) & it's going to take months to resolve    COPD/emphysema> Gold Stage 3 on PFTs 4/16; on NEBs w/ Duoneb Qid & Budes0,5 Bid but hasn't filled this yet? Asked to use these regularly...    Smoker> she was smoking w/  home O2 over the last 10 yrs; states she quit 4/16 & doing satis w/o the cigarettes...    Mild pulm HTN> 2DEcho 08/2009 showed PAsys=38...    CAD, DiastolicCHF> followed by Hilary Hertz but last seen 2013; not currently on any cardiac meds, and when seen by Cards in 2013 she was similarly not on any cardicac meds...    Mild AS> also seen on 2DEcho 7/11 w/ mean gradient ~35mmHg...    Atherosclerotic peripheral vasc dis> known distal infrarenal saccular AAA measuring 3cm on last ArtDoppler 7/12...    Hx Breast cancer> followed by DrSherrill but last seen 2012...    DJD, osteoporosis, LBP w/ compression fxs &  augmentation, hx hip fx> DrOlin is her orthopedist...    Anxiety/Depression> on Klonopin 0.5mg  Bid as needed and Zoloft100/d...  We reviewed prob list, meds, xrays and labs> see below for updates >> PCP is Dr. Kathyrn Lass [facility]) IMP/PLAN>>  We are trying to make it easier & cheaper for Jean Mitchell to get & use her breathing meds- in this case DUONEB Qid and Budesonide 0,5mg  Bid;  She is off cigs and this is great;  She is long overdue for medical f/u from her Cardiologist & Oncologist but we will leave this up to her PCP- DrLMiller... We will continue to follow w/ ROV 38mo...     Past Medical History  Diagnosis Date  . Breast cancer 2007    lumpectomy, XRT previously on Tamoxifen   . Hyperlipidemia   . CHF (congestive heart failure)   . COPD (chronic obstructive pulmonary disease)     o2 at night  . Depression   . AAA (abdominal aortic aneurysm)   . Back pain     lumbar, chronic  . Hypertension   . Osteoporosis     dexa 02-18-08  . Insomnia   . Esophageal stricture   . Adenomatous colon polyp   . Hip fracture 08/2009    R, s/p ORIF  . Arthritis   . Ulcer   . Heart disease   . Diverticulosis   . Anxiety 02/15/2011  . Impaired glucose tolerance 02/18/2011  . Internal hemorrhoids   . 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 Presbyesophagus   . Gastritis   . Tubular adenoma   . Breast cancer     Past Surgical History  Procedure Laterality Date  . Oophorectomy      unilateral  . Knee arthroscopy      right   . Hemorrhoid surgery  1965  . Mastectomy, partial  2007    left  . Abdominal hysterectomy  1968  . Tonsillectomy  1968  . Rotator cuff repair    . Anal carcinoma resection    . Kyphosis surgery      balloon  . Cesarean section      x 2  . Hip fracture surgery  2011    right with plate with persistent weakness of the RLE  . Femur im nail  03/17/2012    Procedure: INTRAMEDULLARY (IM) NAIL FEMORAL;  Surgeon: Mauri Pole, MD;  Location: [facility];  Service:  Orthopedics;  Laterality: Left;    Patient's Medications  New Prescriptions   ALBUTEROL (PROAIR HFA) 108 (90 BASE) MCG/ACT INHALER  Inhale 2 puffs into the lungs every 4 (four) hours as needed for wheezing or shortness of breath.   IPRATROPIUM-ALBUTEROL (DUONEB) 0.5-2.5 (3) MG/3ML SOLN  Take 3 mLs by nebulization 3 (three) times daily.  Previous Medications   ALBUTEROL (PROAIR HFA) 108 (  90 BASE) MCG/ACT INHALER  Inhale 2 puffs into the lungs every 6 (six) hours as needed for wheezing.   ALBUTEROL (PROVENTIL) (5 MG/ML) 0.5% NEBULIZER SOLUTION  Take 0.5 mLs (2.5 mg total) by nebulization 3 (three) times daily.   CLONAZEPAM (KLONOPIN) 0.5 MG TABLET  Take 1 tablet (0.5 mg total) by mouth 2 (two) times daily as needed for anxiety.   PRAVASTATIN (PRAVACHOL) 40 MG TABLET  Take 1 tablet (40 mg total) by mouth daily.   SERTRALINE (ZOLOFT) 100 MG TABLET  Take 1 tablet (100 mg total) by mouth daily.  Modified Medications   No medications on file  Discontinued Medications          Allergies  Allergen Reactions  . Gabapentin Hives    whelps  . Ezetimibe-Simvastatin     unknown  . Percocet [Oxycodone-Acetaminophen] Nausea And Vomiting    5/325 mg    Social History Narrative   Lives by herself--son and granddaughter brings food, limited ADL since 7/11--- still drives.  She has a cane and walker at home, which she does not use.     Current Medications, Allergies, Past Medical History, Past Surgical History, Family History, and Social History were reviewed in [employer] record.   Review of Systems            All symptoms NEG except where BOLDED >>  Constitutional:  F/C/S, fatigue, anorexia, unexpected weight change. HEENT:  HA, visual changes, hearing loss, earache, nasal symptoms, sore throat, mouth sores, hoarseness. Resp:  cough, sputum, hemoptysis; SOB, tightness, wheezing. Cardio:  CP, palpit, DOE, orthopnea, edema. GI:   N/V/D/C, blood in stool; reflux, abd pain, distention, gas. GU:  dysuria, freq, urgency, hematuria, flank pain, voiding difficulty. MS:  joint pain, swelling, tenderness, decr ROM; neck pain, back pain, etc. Neuro:  HA, tremors, seizures, dizziness, syncope, weakness, numbness, gait abn. Skin:  suspicious lesions or skin rash. Heme:  adenopathy, bruising, bleeding. Psyche:  confusion, agitation, sleep disturbance, hallucinations, anxiety, depression suicidal.   Objective:   Physical Exam    Vital Signs:  Reviewed...  General:  Thin elderly frail 81 y/o WF in NAD; alert & oriented; pleasant & cooperative... HEENT:  [place]/AT; Conjunctiva- pink, Sclera- nonicteric, EOM-wnl, PERRLA, EACs-clear, TMs-wnl; NOSE-clear; THROAT-clear & wnl. Neck:  Supple w/ decr ROM; no JVD; normal carotid impulses w/o bruits; no thyromegaly or nodules palpated; no lymphadenopathy. Chest:  decr BS at bases, few rhonchi, no wheezing or signs of consolidation Heart:  Regular Rhythm; norm S1 & S2 epigastric displaced PMI & gr2/6 SEM (AS), no rubs or gallops detected Abdomen:  Soft & nontender- no guarding or rebound; normal bowel sounds; no organomegaly or masses palpated. Ext:  Decreased ROM deformities & arthritic changes; no varicose veins, +venous insuffic, tr edema;  Pulses intact w/o bruits. Neuro:  CNs II-XII intact; motor testing normal; sensory testing OK & + gait abn present w/ balance ok. Derm:  No lesions noted; no rash etc. Lymph:  No cervical, supraclavicular, axillary, or inguinal adenopathy palpated.   Assessment:      IMP >>     COPD/emphysema> Gold Stage 3 on NEBs w/ Duoneb Qid & Budes0.5 Bid => Asked to use these regularly...    Smoker> she was smoking w/ home O2 over the last 10 yrs; states she quit 4/16 & doing satis w/o the cigarettes...    Mild pulm HTN> 2DEcho 08/2009 showed PAsys=38...    CAD, DiastolicCHF> followed by Hilary Hertz but last seen 2013; not currently on any  cardiac meds, and when  seen by Cards in 2013 she was similarly not on any cardicac meds...    Mild AS> also seen on 2DEcho 7/11 w/ mean gradient ~3mmHg...    Atherosclerotic peripheral vasc dis> known distal infrarenal saccular AAA measuring 3cm on last ArtDoppler 7/12...    Hx Breast cancer> followed by DrSherrill but last seen 2012...    DJD, osteoporosis, LBP w/ compression fxs & augmentation, hx hip fx> DrOlin is her orthopedist...    Anxiety/Depression> on Klonopin 0.5mg  Bid as needed and Zoloft100/d...   PLAN >>  We are trying to make it easier & cheaper for Jean Mitchell to get & use her breathing meds- in this case DUONEB Qid and Budesonide 0,5mg  Bid;  She is off cigs and this is great;  She is long overdue for medical f/u from her Cardiologist & Oncologist but we will leave this up to her PCP- DrLMiller... We will continue to follow w/ ROV 72mo.     Plan:     Patient's Medications  New Prescriptions   ALBUTEROL (PROAIR HFA) 108 (90 BASE) MCG/ACT INHALER  Inhale 2 puffs into the lungs every 4 (four) hours as needed for wheezing or shortness of breath.   IPRATROPIUM-ALBUTEROL (DUONEB) 0.5-2.5 (3) MG/3ML SOLN  Take 3 mLs by nebulization 3 (three) times daily.  Previous Medications   ALBUTEROL (PROAIR HFA) 108 (90 BASE) MCG/ACT INHALER  Inhale 2 puffs into the lungs every 6 (six) hours as needed for wheezing.   ALBUTEROL (PROVENTIL) (5 MG/ML) 0.5% NEBULIZER SOLUTION  Take 0.5 mLs (2.5 mg total) by nebulization 3 (three) times daily.   CLONAZEPAM (KLONOPIN) 0.5 MG TABLET  Take 1 tablet (0.5 mg total) by mouth 2 (two) times daily as needed for anxiety.   PRAVASTATIN (PRAVACHOL) 40 MG TABLET  Take 1 tablet (40 mg total) by mouth daily.   SERTRALINE (ZOLOFT) 100 MG TABLET  Take 1 tablet (100 mg total) by mouth daily.  Modified Medications   No medications on file  Discontinued Medications

## 2014-08-17 NOTE — Patient Instructions (Signed)
Today we updated your med list in our EPIC system...    Continue your current medications the same...  We are calling your Pharmacy to see if they can combine the ALBUTEROL & IPRATROPIUM into one vial (DUONEB)...    Use this in your NEBULIZER 4 times daily (breakfast, lunch, dinner, bedtime)...  AND use the BUDESONIDE 0.5mg  cortisone medication in the nebulizer twice daily every day...  Continue your MUCINEX 1200mg  twice daily & lots of water by mouth...   For your left leg swelling>>    NO SALT!!!    Keep your legs elevated as much as possible...    Wear support hose when up 7 about...  Call for any questions...  Let's plan a follow up visit in 97mo, sooner if needed for problems.Marland KitchenMarland Kitchen

## 2014-08-29 ENCOUNTER — Emergency Department (HOSPITAL_COMMUNITY)
Admission: EM | Admit: 2014-08-29 | Discharge: 2014-08-29 | Disposition: A | Discharge status: Home / Self Care | Payer: Medicare Other | Attending: Emergency Medicine | Admitting: Emergency Medicine

## 2014-08-29 ENCOUNTER — Emergency Department (HOSPITAL_COMMUNITY): Payer: Medicare Other

## 2014-08-29 ENCOUNTER — Encounter (HOSPITAL_COMMUNITY): Payer: Self-pay | Admitting: Emergency Medicine

## 2014-08-29 DIAGNOSIS — W010XXA Fall on same level from slipping, tripping and stumbling without subsequent striking against object, initial encounter: Secondary | ICD-10-CM | POA: Diagnosis not present

## 2014-08-29 DIAGNOSIS — J449 Chronic obstructive pulmonary disease, unspecified: Secondary | ICD-10-CM | POA: Diagnosis not present

## 2014-08-29 DIAGNOSIS — S0993XA Unspecified injury of face, initial encounter: Secondary | ICD-10-CM | POA: Diagnosis present

## 2014-08-29 DIAGNOSIS — Y999 Unspecified external cause status: Secondary | ICD-10-CM | POA: Insufficient documentation

## 2014-08-29 DIAGNOSIS — Y929 Unspecified place or not applicable: Secondary | ICD-10-CM | POA: Insufficient documentation

## 2014-08-29 DIAGNOSIS — S2220XA Unspecified fracture of sternum, initial encounter for closed fracture: Secondary | ICD-10-CM | POA: Diagnosis not present

## 2014-08-29 DIAGNOSIS — Z853 Personal history of malignant neoplasm of breast: Secondary | ICD-10-CM | POA: Diagnosis not present

## 2014-08-29 DIAGNOSIS — S02401A Maxillary fracture, unspecified, initial encounter for closed fracture: Secondary | ICD-10-CM | POA: Insufficient documentation

## 2014-08-29 DIAGNOSIS — M1990 Unspecified osteoarthritis, unspecified site: Secondary | ICD-10-CM | POA: Diagnosis not present

## 2014-08-29 DIAGNOSIS — Z79899 Other long term (current) drug therapy: Secondary | ICD-10-CM | POA: Diagnosis not present

## 2014-08-29 DIAGNOSIS — I10 Essential (primary) hypertension: Secondary | ICD-10-CM | POA: Insufficient documentation

## 2014-08-29 DIAGNOSIS — R040 Epistaxis: Secondary | ICD-10-CM | POA: Diagnosis not present

## 2014-08-29 DIAGNOSIS — M810 Age-related osteoporosis without current pathological fracture: Secondary | ICD-10-CM | POA: Diagnosis not present

## 2014-08-29 DIAGNOSIS — F329 Major depressive disorder, single episode, unspecified: Secondary | ICD-10-CM | POA: Diagnosis not present

## 2014-08-29 DIAGNOSIS — I509 Heart failure, unspecified: Secondary | ICD-10-CM | POA: Diagnosis not present

## 2014-08-29 DIAGNOSIS — Z87891 Personal history of nicotine dependence: Secondary | ICD-10-CM | POA: Insufficient documentation

## 2014-08-29 DIAGNOSIS — F419 Anxiety disorder, unspecified: Secondary | ICD-10-CM | POA: Diagnosis not present

## 2014-08-29 DIAGNOSIS — T1490XA Injury, unspecified, initial encounter: Secondary | ICD-10-CM

## 2014-08-29 DIAGNOSIS — Y9301 Activity, walking, marching and hiking: Secondary | ICD-10-CM | POA: Insufficient documentation

## 2014-08-29 DIAGNOSIS — Z86010 Personal history of colonic polyps: Secondary | ICD-10-CM | POA: Diagnosis not present

## 2014-08-29 DIAGNOSIS — S022XXA Fracture of nasal bones, initial encounter for closed fracture: Secondary | ICD-10-CM | POA: Diagnosis not present

## 2014-08-29 DIAGNOSIS — E785 Hyperlipidemia, unspecified: Secondary | ICD-10-CM | POA: Insufficient documentation

## 2014-08-29 DIAGNOSIS — S0292XA Unspecified fracture of facial bones, initial encounter for closed fracture: Secondary | ICD-10-CM

## 2014-08-29 MED ORDER — HYDROCODONE-ACETAMINOPHEN 5-325 MG PO TABS: 1.0000 | ORAL_TABLET | ORAL | Status: DC | PRN

## 2014-08-29 MED ORDER — MORPHINE SULFATE 4 MG/ML IJ SOLN
4.0000 mg | Freq: Once | INTRAMUSCULAR | Status: AC
  Administered 2014-08-29: 4 mg via INTRAMUSCULAR
  Filled 2014-08-29: qty 1

## 2014-08-29 MED ORDER — ONDANSETRON 8 MG PO TBDP: 8.0000 mg | ORAL_TABLET | Freq: Three times a day (TID) | ORAL | Status: DC | PRN

## 2014-08-29 MED ORDER — OXYMETAZOLINE HCL 0.05 % NA SOLN
1.0000 | Freq: Once | NASAL | Status: AC
  Administered 2014-08-29: 1 via NASAL
  Filled 2014-08-29: qty 15

## 2014-08-29 NOTE — ED Notes (Signed)
Dr Allan at bedside °

## 2014-08-29 NOTE — Discharge Instructions (Signed)
Facial Fracture A facial fracture is a break in one of the bones of your face. HOME CARE INSTRUCTIONS   Protect the injured part of your face until it is healed.  Do not participate in activities which give chance for re-injury until your doctor approves.  Gently wash and dry your face.  Wear head and facial protection while riding a bicycle, motorcycle, or snowmobile. SEEK MEDICAL CARE IF:   An oral temperature above 102 F (38.9 C) develops.  You have severe headaches or notice changes in your vision.  You have new numbness or tingling in your face.  You develop nausea (feeling sick to your stomach), vomiting or a stiff neck. SEEK IMMEDIATE MEDICAL CARE IF:   You develop difficulty seeing or experience double vision.  You become dizzy, lightheaded, or faint.  You develop trouble speaking, breathing, or swallowing.  You have a watery discharge from your nose or ear. MAKE SURE YOU:   Understand these instructions.  Will watch your condition.  Will get help right away if you are not doing well or get worse. Document Released: 01/29/2005 Document Revised: 04/23/2011 Document Reviewed: 09/18/2007 [facility] Patient Information 2015 [place], [place]. This information is not intended to replace advice given to you by your health care provider. Make sure you discuss any questions you have with your health care provider. Nasal Fracture A nasal fracture is a break or crack in the bones of the nose. A minor break usually heals in a month. You often will receive black eyes from a nasal fracture. This is not a cause for concern. The black eyes will go away over 1 to 2 weeks.  DIAGNOSIS  Your caregiver may want to examine you if you are concerned about a fracture of the nose. X-rays of the nose may not show a nasal fracture even when one is present. Sometimes your caregiver must wait 1 to 5 days after the injury to re-check the nose for alignment and to take additional X-rays. Sometimes  the caregiver must wait until the swelling has gone down. TREATMENT Minor fractures that have caused no deformity often do not require treatment. More serious fractures where bones are displaced may require surgery. This will take place after the swelling is gone. Surgery will stabilize and align the fracture. HOME CARE INSTRUCTIONS   Put ice on the injured area.  Put ice in a plastic bag.  Place a towel between your skin and the bag.  Leave the ice on for 15-20 minutes, 03-04 times a day.  Take medications as directed by your caregiver.  Only take over-the-counter or prescription medicines for pain, discomfort, or fever as directed by your caregiver.  If your nose starts bleeding, squeeze the soft parts of the nose against the center wall while you are sitting in an upright position for 10 minutes.  Contact sports should be avoided for at least 3 to 4 weeks or as directed by your caregiver. SEEK MEDICAL CARE IF:  Your pain increases or becomes severe.  You continue to have nosebleeds.  The shape of your nose does not return to normal within 5 days.  You have pus draining from the nose. SEEK IMMEDIATE MEDICAL CARE IF:   You have bleeding from your nose that does not stop after 20 minutes of pinching the nostrils closed and keeping ice on the nose.  You have clear fluid draining from your nose.  You notice a grape-like swelling on the dividing wall between the nostrils (septum). This is a collection  of blood (hematoma) that must be drained to help prevent infection.  You have difficulty moving your eyes.  You have recurrent vomiting. Document Released: 01/27/2000 Document Revised: 04/23/2011 Document Reviewed: 05/15/2010 [facility] Patient Information 2015 [place], [place]. This information is not intended to replace advice given to you by your health care provider. Make sure you discuss any questions you have with your health care provider. Sternal Fracture The sternum is the  bone in the center of the front of your chest which your ribs attach to. It is also called the breastbone. The most common cause of a sternal fracture (break in the bone) is an injury. The most common injury is from a motor vehicle accident. The fracture often comes from the seat belt or hitting the chest on the steering wheel or being forcibly bent forward (shoulders toward your knees) during an accident. It is more common in females and the elderly. The fracture of the sternum is usually not a problem if there are no other injuries. Other injuries that may happen are to the ribs, heart, lungs, and abdominal organs. SYMPTOMS  Common complaints from a fracture of the sternum include:  Shortness of breath.  Pain with breathing or difficulty breathing.  Bruises about the chest.  Tenderness or a cracking sound at the breastbone. DIAGNOSIS  Your caregiver may be able to tell if the sternum is broken by examining you. Other times studies such as X-ray, CAT scan, ultrasound, and nuclear medicine are used to detect a fracture.  TREATMENT   Sternal fractures usually are not serious and if displacement is minimal, no treatment is necessary.  The main concern is with damage to the surrounding structures: ribs, heart, great vessels coming from the heart, and the backbone in the chest area.  Multiple rib fractures may cause breathing difficulties.  Injury to one of the large vessels in the chest may be a threat to life and require immediate surgery.  If injury to the heart or lungs is suspected it may be necessary to stay in the hospital and be monitored.  Other injuries will be treated as needed.  If the pieces of the breastbone are out of normal position, they may need to be reduced (put back in position) and then wired in place or fixed with a plate and screws during an operation. HOME CARE INSTRUCTIONS   Avoid strenuous activity. Be careful during activities and avoid bumping or reinjuring the  injured sternum. Activities that cause pain pull on the fracture site(s) and are best avoided if possible.  Eat a normal, well-balanced diet. Drink plenty of fluids to avoid constipation, a common side effect of pain medications.  Take deep breaths and cough several times a day, splinting the injured area with a pillow. This will help prevent pneumonia.  Do not wear a rib belt or binder for the chest unless instructed otherwise. These restrict breathing and can lead to pneumonia.  Only take over-the-counter or prescription medicines for pain, discomfort, or fever as directed by your caregiver. SEEK MEDICAL CARE IF:  You develop a continual cough, associated with thick or bloody mucus or phlegm (sputum). SEEK IMMEDIATE MEDICAL CARE IF:   You have a fever.  You have increasing difficulty breathing.  You feel sick to your stomach (nausea), vomit, or have abdominal pain.  You have worsening pain, not controlled with medications.  You develop pain in the tops of your shoulders (in the shoulder strap area).  You feel light-headed or faint.  You develop chest  pain or an abnormal heartbeat (palpitations).  You develop pain radiating into the jaw, teeth or down the arms. Document Released: 09/13/2003 Document Revised: 06/15/2013 Document Reviewed: 05/03/2008 [facility] Patient Information 2015 [place], [place]. This information is not intended to replace advice given to you by your health care provider. Make sure you discuss any questions you have with your health care provider.

## 2014-08-29 NOTE — ED Notes (Signed)
Patient transported to CT 

## 2014-08-29 NOTE — ED Provider Notes (Signed)
CSN: [id number]     Arrival date & time 08/29/14  0957 History   First MD Initiated Contact with Patient 08/29/14 1006     Chief Complaint  Patient presents with  . Fall     (Consider location/radiation/quality/duration/timing/severity/associated sxs/prior Treatment) HPI Comments: Pt here after a mechanical fall pta--tripped while walking her dog and fell forward onto her face No loc, denies head or neck pain C/o mid facial pain and chipped front tooth C/o sharp midsternal pain worse with movement, also c/o bleeding from right and left nares No upper or lower extrem weakness No abd or chest pain, no recent illnesses or medication changes Ems called and pt transported here  Patient is a 81 y.o. female presenting with fall. The history is provided by the patient.  Fall    Past Medical History  Diagnosis Date  . Breast cancer 2007    lumpectomy, XRT previously on Tamoxifen   . Hyperlipidemia   . CHF (congestive heart failure)   . COPD (chronic obstructive pulmonary disease)     o2 at night  . Depression   . AAA (abdominal aortic aneurysm)   . Back pain     lumbar, chronic  . Hypertension   . Osteoporosis     dexa 02-18-08  . Insomnia   . Esophageal stricture   . Adenomatous colon polyp   . Hip fracture 08/2009    R, s/p ORIF  . Arthritis   . Ulcer   . Heart disease   . Diverticulosis   . Anxiety 02/15/2011  . Impaired glucose tolerance 02/18/2011  . Internal hemorrhoids   . 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 Presbyesophagus   . Gastritis   . Tubular adenoma   . Breast cancer    Past Surgical History  Procedure Laterality Date  . Oophorectomy      unilateral  . Knee arthroscopy      right   . Hemorrhoid surgery  1965  . Mastectomy, partial  2007    left  . Abdominal hysterectomy  1968  . Tonsillectomy  1968  . Rotator cuff repair    . Anal carcinoma resection    . Kyphosis surgery      balloon  . Cesarean section      x 2  . Hip fracture surgery   2011    right with plate with persistent weakness of the RLE  . Femur im nail  03/17/2012    Procedure: INTRAMEDULLARY (IM) NAIL FEMORAL;  Surgeon: Mauri Pole, MD;  Location: [facility];  Service: Orthopedics;  Laterality: Left;   Family History  Problem Relation Age of Onset  . Heart disease Mother   . Cancer Mother     bladder  . Colon cancer Father 78  . Heart disease Sister   . Breast cancer Sister   . Colon polyps Sister    History  Substance Use Topics  . Smoking status: Former Smoker -- 0.50 packs/day for 60 years    Types: Cigarettes    Start date: 04/16/1959    Quit date: 05/20/2014  . Smokeless tobacco: Never Used  . Alcohol Use: No   OB History    No data available     Review of Systems  All other systems reviewed and are negative.     Allergies  Gabapentin; Ezetimibe-simvastatin; and Percocet  Home Medications   Prior to Admission medications   Medication Sig Start Date End Date Taking? Authorizing Provider  albuterol (PROAIR HFA) 108 (90  BASE) MCG/ACT inhaler Inhale 2 puffs into the lungs every 6 (six) hours as needed for wheezing. 11/04/12   Biagio Borg, MD  albuterol (PROVENTIL) (2.5 MG/3ML) 0.083% nebulizer solution Take 2.5 mg by nebulization every 6 (six) hours as needed for wheezing or shortness of breath.    Historical Provider, MD  budesonide (PULMICORT) 0.5 MG/2ML nebulizer solution Take 2 mLs (0.5 mg total) by nebulization 2 (two) times daily. 08/17/14   Noralee Space, MD  clonazePAM (KLONOPIN) 0.5 MG tablet Take 1 tablet (0.5 mg total) by mouth 2 (two) times daily as needed for anxiety. 05/01/13   Midge Minium, MD  ipratropium (ATROVENT) 0.02 % nebulizer solution Take 0.5 mg by nebulization 4 (four) times daily. Ipratropium bromide and albuterol sulfate 0.5mg /3mg  per 3 ml    Historical Provider, MD  pravastatin (PRAVACHOL) 40 MG tablet Take 1 tablet (40 mg total) by mouth daily. 10/22/11   Biagio Borg, MD  sertraline (ZOLOFT) 100 MG tablet Take 1  tablet (100 mg total) by mouth daily. 02/24/13   Biagio Borg, MD   SpO2 94% Physical Exam  Constitutional: She is oriented to person, place, and time. She appears well-developed and well-nourished.  Non-toxic appearance. No distress.  HENT:  Head: Normocephalic and atraumatic.  Nose: No nasal septal hematoma. Epistaxis is observed.  Mouth/Throat:    No malocculsion  Eyes: Conjunctivae, EOM and lids are normal. Pupils are equal, round, and reactive to light.  Neck: Normal range of motion. Neck supple. No tracheal deviation present. No thyroid mass present.  Cardiovascular: Normal rate, regular rhythm and normal heart sounds.  Exam reveals no gallop.   No murmur heard. Pulmonary/Chest: Effort normal. No stridor. No respiratory distress. She has decreased breath sounds. She has no wheezes. She has no rhonchi. She has no rales.    Abdominal: Soft. Normal appearance and bowel sounds are normal. She exhibits no distension. There is no tenderness. There is no rebound and no CVA tenderness.  Musculoskeletal: Normal range of motion. She exhibits no edema or tenderness.  Neurological: She is alert and oriented to person, place, and time. She has normal strength. No cranial nerve deficit or sensory deficit. GCS eye subscore is 4. GCS verbal subscore is 5. GCS motor subscore is 6.  Skin: Skin is warm and dry. No abrasion and no rash noted.  Psychiatric: She has a normal mood and affect. Her speech is normal and behavior is normal.  Nursing note and vitals reviewed.   ED Course  Procedures (including critical care time) Labs Review Labs Reviewed - No data to display  Imaging Review No results found.   EKG Interpretation None      MDM   Final diagnoses:  Trauma  Trauma  Trauma   Patient given morphine for pain and does feel better. Patient's nosebleed improved after being given Afrin. X-rays results reviewed with patient. Will refer patient to maxillofacial surgeon on call for her  facial bone fractures. We'll treat her sternal fracture symptomatically    Lacretia Leigh, MD 08/29/14 1259

## 2014-08-29 NOTE — ED Notes (Signed)
Awake. Verbally responsive. A/O x4. Resp even and unlabored. No audible adventitious breath sounds noted. ABC's intact. No epitaxis noted. Family at bedside.

## 2014-08-29 NOTE — ED Notes (Signed)
Bed: WI09 Expected date:  Expected time:  Means of arrival:  Comments: EMS- fall, facial injury

## 2014-08-29 NOTE — ED Notes (Addendum)
Patient transported to X-ray and CT scan.  

## 2014-08-29 NOTE — ED Notes (Signed)
Applied band aids to abrasions on lt 5th finger and lt wrist area.

## 2014-08-29 NOTE — ED Notes (Addendum)
Pt arrived via EMS with report of entire chest pain with pain with deep breathing, epitaxi with small amt, missing tooth, abrasion to lt 5th finger and wrist s/p to tripping and falling face first on concrete while letting dog out. Pt denies dizziness/ lightheadedness, vision disturbances, (-)LOC or headache.

## 2014-08-29 NOTE — ED Notes (Signed)
Pt returned from CT scan and X-ray without distress noted.

## 2014-08-29 NOTE — ED Notes (Signed)
Awake. Verbally responsive. A/O x4. Resp even and unlabored. No audible adventitious breath sounds noted. ABC's intact. No epitaxis noted. Pt has continous O2 at 3lpm. Family at bedside.

## 2014-08-31 ENCOUNTER — Institutional Professional Consult (permissible substitution): Payer: Medicare Other | Admitting: Cardiovascular Disease

## 2014-09-06 ENCOUNTER — Telehealth: Payer: Self-pay | Admitting: Pulmonary Disease

## 2014-09-06 NOTE — Telephone Encounter (Signed)
Called spoke with pt. appt scheduled with SN friday

## 2014-09-10 ENCOUNTER — Encounter: Payer: Self-pay | Admitting: Pulmonary Disease

## 2014-09-10 ENCOUNTER — Ambulatory Visit (INDEPENDENT_AMBULATORY_CARE_PROVIDER_SITE_OTHER): Payer: Medicare Other | Admitting: Pulmonary Disease

## 2014-09-10 VITALS — BP 120/78 | HR 88 | Temp 97.0°F | Ht 65.0 in | Wt 107.6 lb

## 2014-09-10 DIAGNOSIS — R102 Pelvic and perineal pain: Secondary | ICD-10-CM

## 2014-09-10 DIAGNOSIS — W19XXXD Unspecified fall, subsequent encounter: Secondary | ICD-10-CM | POA: Diagnosis not present

## 2014-09-10 DIAGNOSIS — J439 Emphysema, unspecified: Secondary | ICD-10-CM

## 2014-09-10 DIAGNOSIS — S2220XD Unspecified fracture of sternum, subsequent encounter for fracture with routine healing: Secondary | ICD-10-CM | POA: Diagnosis not present

## 2014-09-10 DIAGNOSIS — Y92009 Unspecified place in unspecified non-institutional (private) residence as the place of occurrence of the external cause: Principal | ICD-10-CM

## 2014-09-10 DIAGNOSIS — N949 Unspecified condition associated with female genital organs and menstrual cycle: Secondary | ICD-10-CM

## 2014-09-10 DIAGNOSIS — F419 Anxiety disorder, unspecified: Secondary | ICD-10-CM

## 2014-09-10 NOTE — Patient Instructions (Signed)
Today we updated your med list in our EPIC system...    Continue your current medications the same...  For your trauma related pain & discomfort>>    Rest at home...    Apply heating pad to chest wall as needed for relief...    Use the TRAMADOL 50mg  w/ a Tylenol tab every 8H as needed for pain...  We will arrange for an evaluation by your Orthopedist ASAP for you pelvis area pain...  Call for any questions...  You should follow up w/ DrMiller, your PCP in a week or two.Marland KitchenMarland Kitchen

## 2014-09-10 NOTE — Progress Notes (Addendum)
Subjective:     Patient ID: Jean Mitchell, female   DOB: 1927/10/13, 81 y.o.   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 Kathyrn Lass at [facility], due to dyspnea>  Jean Mitchell is a current smoker- starting later in life around 1970 (at age 23), and never up to 1ppd she says, currently smoking 4-5 cig/d and notes that she quit off & on over the yrs, est <40 pack-yrs overall... She c/o SOB/ DOE w/ min activ & ADLs (but blames her hips not her breathing), states she's able to get about the house/ cook/ do laundry & personal care needs, but can't clean/ walk any distance/ stairs/ etc...  She notes cough prod of sm amt thick white sputum (no color or blood); occas wheezing; denies CP/ palpit/ edema... She was hosp w/ pneumonia around 2005 & was in the ICU at [facility], disch on oxygen (and has been on this for the last 10 yrs- uses it Qhs); told she has COPD but denies freq exacerbations, antibiotics, prednisone, etc... Records from [facility] shows COPD but we do not have prev PFTs to review; on NEBS w/ albut Tid, Spiriva respimat- 2 inhalations daily, Combivent respimat prn & home oxygen but she has trouble using the respimat & can't afford her meds from Pharm...       EXAM shows Afeb, VSS, O2sat=93% on RA at rest; Chest reveals decr BS at bases, few rhonchi, no wheezing or signs of consolidation; Cardia reveals an epigastric displaced PMI & gr2/6 SEM (AS), no rubs or gallops detected; Ext- bruise on left leg w/ sm hematoma from trauma... We reviewed prob list, meds, xrays and labs>   2DEcho 08/2009 showed norm LV size & function w/ EF=55-60%, normal wall motion, mild AS, mild pulmHTN w/ PAsys=46mmHg...   CXR 05/2014 (I personally reviewed the images) showed mild cardiomeg, coronary calcif & atherosclerosis in Ao; COPD/ emphysema but NAD; spinal arthritis/ mult  compression fxs & augmentations...  Spirometry 05/2014 (interpreted by myself) showed FVC=2.13 (79%), FEV1=0.85 (44%), %1sec=40, mid-flows=20% predicted; c/w severe airflow obstruction and GOLD Stage 3 COPD...  Ambulatory oxygen test> O2sat=96% on RA at rest w/ pulse=74/min;  She walked 3 laps w/ lowest O2sat=92% w/ pulse 96/min  LABS in EPIC reviewed by me... PLAN>> we discussed the IMPERATIVE to quit smoking completely- discussed nicotine replacement options, Chantix, etc (she has Klonopin 0.5mg  & Sertraline 100mg  already);  We discussed switching her inhaled meds to NEBULIZED DUONEB Tid regularly, and ProairHFA when out & about;  She does not need antibiotics or Pred at this time;  I would like to recheck her in several months, sooner if needed for any reason...   ~  August 17, 2014:  26mo ROV w/ SN> Kassidie reports that her breathing is stable but she is confused about her NEB meds, we did NEBs to save her $$ on Medicare partD drugs, supposed to be on Albut, Ipratrop, Budes but she quit 3 weeks ago- ?why?  States she had swollen left leg, saw DrOlin & told "hematoma" (she had neg ven dopplers) & it's going to take months to resolve    COPD/emphysema> Gold Stage 3 on PFTs 4/16; on NEBs w/ Duoneb Qid & Budes0,5 Bid but hasn't filled this yet? Asked to use these regularly...    Smoker> she was smoking w/  home O2 over the last 10 yrs; states she quit 4/16 & doing satis w/o the cigarettes...    Mild pulm HTN> 2DEcho 08/2009 showed PAsys=38...    CAD, DiastolicCHF> followed by Hilary Hertz but last seen 2013; not currently on any cardiac meds, and when seen by Cards in 2013 she was similarly not on any cardicac meds...    Mild AS> also seen on 2DEcho 7/11 w/ mean gradient ~68mmHg...    Atherosclerotic peripheral vasc dis> known distal infrarenal saccular AAA measuring 3cm on last ArtDoppler 7/12...    Hx Breast cancer> followed by DrSherrill but last seen 2012...    DJD, osteoporosis, LBP w/ compression fxs &  augmentation, hx hip fx> DrOlin is her orthopedist...    Anxiety/Depression> on Klonopin 0.5mg  Bid as needed and Zoloft100/d...  We reviewed prob list, meds, xrays and labs> see below for updates >> PCP is Dr. Kathyrn Lass [facility]) IMP/PLAN>>  We are trying to make it easier & cheaper for Jean Mitchell to get & use her breathing meds- in this case DUONEB Qid and Budesonide 0,5mg  Bid;  She is off cigs and this is great;  She is long overdue for medical f/u from her Cardiologist & Oncologist but we will leave this up to her PCP- DrLMiller... We will continue to follow w/ ROV 15mo...   ~  September 10, 2014:  3wk ROV & add-on appt requested after recent ER visit>       61 y/o WF reports to me that she tripped over a friend's wheelchair 08/29/14 & fell on her face w/ chipped tooth, nasal & facial bone fxs, sternal fx => referred to maxillofacial surg, dentist, & asked to f/u here; I have reviewed the ER note, XRays and scans; she was given Vicodin for pain & Zofran for nausea; her grand-daughter tells me that she became confused on the Vicodin & fell again at home w/ bruising in pelvis L>R w/ pelvic pain & refused to go back to the ER => needs prompt Ortho assessment to r/o pelvic fx & Lacie Scotts has agreed to see her this afternoon...    Mult trauma w/ falls at home> she will go [facility] to [facility], has appt w/ oral surg re: facial bones, and Dentist re: teeth...    COPD/emphysema> Gold Stage 3 on PFTs 4/16; on NEBs w/ Duoneb Qid & Budes0,5 Bid & reminded to do these regularly...    Ex-smoker> she quit 4/16...    Sternal fx> secondary to fall 08/29/14; she got confused on Vicodin; use Tramadol/ Tylenol prn, heating pad, it will take some time...    DJD, osteoporosis, LBP w/ compression fxs & augmentation, hx hip fx> DrOlin is her orthopedist...    Pelvic pain & ecchymosis in pelvic area> this occurred after 2nd fall at home;  She has appt w/ [facility]... We reviewed prob list, meds, xrays and labs>    CT maxillofacial7/17/16 showed mult fxs ant & post walls of max sinuses bilat & nasal bones..  CXR 08/29/14 showed COPD/ E, mild buckling of sternum (poss non-displaced sternal fx), degen changes in spine w/ mult augmentations of vertebral bodies...  IMP/PLAN>>  She needs specialty attn w/ Maxillofacial surg, Dentist, and Ortho=> all arranged; rec rest/ heat/ Tramadol +-Tylenol for the discomfort...    Past Medical History  Diagnosis Date  . Breast cancer 2007    lumpectomy, XRT previously on Tamoxifen   . Hyperlipidemia   . CHF (congestive heart failure)   . COPD (chronic obstructive  pulmonary disease)     o2 at night  . Depression   . AAA (abdominal aortic aneurysm)   . Back pain     lumbar, chronic  . Hypertension   . Osteoporosis     dexa 02-18-08  . Insomnia   . Esophageal stricture   . Adenomatous colon polyp   . Hip fracture 08/2009    R, s/p ORIF  . Arthritis   . Ulcer   . Heart disease   . Diverticulosis   . Anxiety 02/15/2011  . Impaired glucose tolerance 02/18/2011  . Internal hemorrhoids   . 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 Presbyesophagus   . Gastritis   . Tubular adenoma   . Breast cancer     Past Surgical History  Procedure Laterality Date  . Oophorectomy      unilateral  . Knee arthroscopy      right   . Hemorrhoid surgery  1965  . Mastectomy, partial  2007    left  . Abdominal hysterectomy  1968  . Tonsillectomy  1968  . Rotator cuff repair    . Anal carcinoma resection    . Kyphosis surgery      balloon  . Cesarean section      x 2  . Hip fracture surgery  2011    right with plate with persistent weakness of the RLE  . Femur im nail  03/17/2012    Procedure: INTRAMEDULLARY (IM) NAIL FEMORAL;  Surgeon: Mauri Pole, MD;  Location: [facility];  Service: Orthopedics;  Laterality: Left;    Outpatient Encounter Prescriptions as of 09/10/2014  Medication Sig  . albuterol (PROAIR HFA) 108 (90 BASE) MCG/ACT inhaler Inhale 2 puffs into the  lungs every 6 (six) hours as needed for wheezing.  Marland Kitchen albuterol (PROVENTIL) (2.5 MG/3ML) 0.083% nebulizer solution Take 2.5 mg by nebulization every 6 (six) hours as needed for wheezing or shortness of breath.  . budesonide (PULMICORT) 0.5 MG/2ML nebulizer solution Take 2 mLs (0.5 mg total) by nebulization 2 (two) times daily.  . clonazePAM (KLONOPIN) 0.5 MG tablet Take 1 tablet (0.5 mg total) by mouth 2 (two) times daily as needed for anxiety.  Marland Kitchen ipratropium (ATROVENT) 0.02 % nebulizer solution Take 0.5 mg by nebulization 4 (four) times daily. Ipratropium bromide and albuterol sulfate 0.5mg /3mg  per 3 ml  . ondansetron (ZOFRAN ODT) 8 MG disintegrating tablet Take 1 tablet (8 mg total) by mouth every 8 (eight) hours as needed for nausea or vomiting.  . pravastatin (PRAVACHOL) 40 MG tablet Take 1 tablet (40 mg total) by mouth daily.  . sertraline (ZOLOFT) 100 MG tablet Take 1 tablet (100 mg total) by mouth daily.  . traMADol (ULTRAM) 50 MG tablet Take 50 mg by mouth every 8 (eight) hours as needed. For pain  . HYDROcodone-acetaminophen (NORCO/VICODIN) 5-325 MG per tablet Take 1-2 tablets by mouth every 4 (four) hours as needed. (Patient not taking: Reported on 09/10/2014)   No facility-administered encounter medications on file as of 09/10/2014.    Allergies  Allergen Reactions  . Gabapentin Hives    whelps  . Ezetimibe-Simvastatin     unknown  . Percocet [Oxycodone-Acetaminophen] Nausea And Vomiting    5/325 mg    Social History Narrative   Lives by herself--son and granddaughter brings food, limited ADL since 7/11--- still drives.  She has a cane and walker at home, which she does not use.      Current Medications, Allergies, Past Medical History, Past Surgical History, Family  History, and Social History were reviewed in [employer] record.   Review of Systems            All symptoms NEG except where BOLDED >>  Constitutional:  F/C/S, fatigue, anorexia,  unexpected weight change. HEENT:  HA, visual changes, hearing loss, earache, nasal symptoms, sore throat, mouth sores, hoarseness. Resp:  cough, sputum, hemoptysis; SOB, tightness, wheezing. Cardio:  CP, palpit, DOE, orthopnea, edema. GI:  N/V/D/C, blood in stool; reflux, abd pain, distention, gas. GU:  dysuria, freq, urgency, hematuria, flank pain, voiding difficulty. MS:  joint pain, swelling, tenderness, decr ROM; neck pain, back pain, etc. Neuro:  HA, tremors, seizures, dizziness, syncope, weakness, numbness, gait abn. Skin:  suspicious lesions or skin rash. Heme:  adenopathy, bruising, bleeding. Psyche:  confusion, agitation, sleep disturbance, hallucinations, anxiety, depression suicidal.   Objective:   Physical Exam    Vital Signs:  Reviewed...  General:  Thin elderly frail 81 y/o WF in NAD; alert & oriented; pleasant & cooperative... HEENT:  [place]/AT; Conjunctiva- pink, Sclera- nonicteric, EOM-wnl, PERRLA, EACs-clear, TMs-wnl; NOSE-clear; THROAT-clear & wnl. Neck:  Supple w/ decr ROM; no JVD; normal carotid impulses w/o bruits; no thyromegaly or nodules palpated; no lymphadenopathy. Chest:  decr BS at bases, few rhonchi, no wheezing or signs of consolidation Heart:  Regular Rhythm; norm S1 & S2 epigastric displaced PMI & gr2/6 SEM (AS), no rubs or gallops detected Abdomen:  Soft & nontender- no guarding or rebound; normal bowel sounds; no organomegaly or masses palpated. Ext:  Decreased ROM deformities & arthritic changes; no varicose veins, +venous insuffic, tr edema;  Pulses intact w/o bruits. Neuro:  CNs II-XII intact; motor testing normal; sensory testing OK & + gait abn present w/ balance ok. Derm:  No lesions noted; no rash etc. Lymph:  No cervical, supraclavicular, axillary, or inguinal adenopathy palpated.   Assessment:      IMP >>     COPD/emphysema> Gold Stage 3 on NEBs w/ Duoneb Qid & Budes0.5 Bid => Asked to use these regularly...    Smoker> she was smoking w/ home  O2 over the last 10 yrs; states she quit 4/16 & doing satis w/o the cigarettes...    Mild pulm HTN> 2DEcho 08/2009 showed PAsys=38...    CAD, DiastolicCHF> followed by Hilary Hertz but last seen 2013; not currently on any cardiac meds, and when seen by Cards in 2013 she was similarly not on any cardicac meds...    Mild AS> also seen on 2DEcho 7/11 w/ mean gradient ~59mmHg...    Atherosclerotic peripheral vasc dis> known distal infrarenal saccular AAA measuring 3cm on last ArtDoppler 7/12...    Hx Breast cancer> followed by DrSherrill but last seen 2012...    DJD, osteoporosis, LBP w/ compression fxs & augmentation, hx hip fx> DrOlin is her orthopedist...    Anxiety/Depression> on Klonopin 0.5mg  Bid as needed and Zoloft100/d...   PLAN >>     We are trying to make it easier & cheaper for Shaunda to get & use her breathing meds- in this case DUONEB Qid and Budesonide 0,5mg  Bid;  She is off cigs and this is great;  She is long overdue for medical f/u from her Cardiologist & Oncologist but we will leave this up to her PCP- DrLMiller... We will continue to follow w/ ROV 87mo.    She needs specialty attn w/ Maxillofacial surg, Dentist, and Ortho=> all arranged; rec rest/ heat/ Tramadol +-Tylenol for the discomfort.     Plan:     Patient's  Medications  New Prescriptions   No medications on file  Previous Medications   ALBUTEROL (PROAIR HFA) 108 (90 BASE) MCG/ACT INHALER    Inhale 2 puffs into the lungs every 6 (six) hours as needed for wheezing.   ALBUTEROL (PROVENTIL) (2.5 MG/3ML) 0.083% NEBULIZER SOLUTION    Take 2.5 mg by nebulization every 6 (six) hours as needed for wheezing or shortness of breath.   BUDESONIDE (PULMICORT) 0.5 MG/2ML NEBULIZER SOLUTION    Take 2 mLs (0.5 mg total) by nebulization 2 (two) times daily.   CLONAZEPAM (KLONOPIN) 0.5 MG TABLET    Take 1 tablet (0.5 mg total) by mouth 2 (two) times daily as needed for anxiety.   HYDROCODONE-ACETAMINOPHEN (NORCO/VICODIN) 5-325 MG PER TABLET     Take 1-2 tablets by mouth every 4 (four) hours as needed.   IPRATROPIUM (ATROVENT) 0.02 % NEBULIZER SOLUTION    Take 0.5 mg by nebulization 4 (four) times daily. Ipratropium bromide and albuterol sulfate 0.5mg /3mg  per 3 ml   ONDANSETRON (ZOFRAN ODT) 8 MG DISINTEGRATING TABLET    Take 1 tablet (8 mg total) by mouth every 8 (eight) hours as needed for nausea or vomiting.   PRAVASTATIN (PRAVACHOL) 40 MG TABLET    Take 1 tablet (40 mg total) by mouth daily.   SERTRALINE (ZOLOFT) 100 MG TABLET    Take 1 tablet (100 mg total) by mouth daily.   TRAMADOL (ULTRAM) 50 MG TABLET    Take 50 mg by mouth every 8 (eight) hours as needed. For pain  Modified Medications   No medications on file  Discontinued Medications   No medications on file

## 2014-10-19 ENCOUNTER — Encounter: Payer: Self-pay | Admitting: *Deleted

## 2014-10-19 ENCOUNTER — Ambulatory Visit (INDEPENDENT_AMBULATORY_CARE_PROVIDER_SITE_OTHER): Payer: Medicare Other | Admitting: Cardiovascular Disease

## 2014-10-19 VITALS — BP 119/80 | HR 95 | Wt 100.8 lb

## 2014-10-19 DIAGNOSIS — I739 Peripheral vascular disease, unspecified: Secondary | ICD-10-CM | POA: Insufficient documentation

## 2014-10-19 NOTE — Progress Notes (Signed)
HPI  This is a pleasant 81 year old female who was referred for evaluation of peripheral arterial disease. She has known history of COPD on home oxygen, hyperlipidemia and previous tobacco use. She quit smoking recently after she smoked 4-5 cigarettes a day for 50 years. She is not diaphoretic. She reports previous hip surgery. She fell about 6 weeks ago and had a hairline pelvic fracture and nose fracture. She developed left leg swelling which prompted venous Doppler which showed no evidence of DVT. However, there was incidental finding of an occluded left SFA. The patient denies any previous history of PAD. She denies any calf discomfort with walking but she does complain of bilateral hip pain. She is overall frail. She reports no chest pain. She has chronic dyspnea related to COPD.  Allergies  Allergen Reactions  . Gabapentin Hives    whelps  . Ezetimibe-Simvastatin     unknown  . Percocet [Oxycodone-Acetaminophen] Nausea And Vomiting    5/325 mg     Current Outpatient Prescriptions on File Prior to Visit  Medication Sig Dispense Refill  . albuterol (PROAIR HFA) 108 (90 BASE) MCG/ACT inhaler Inhale 2 puffs into the lungs every 6 (six) hours as needed for wheezing. 8.5 g 11  . albuterol (PROVENTIL) (2.5 MG/3ML) 0.083% nebulizer solution Take 2.5 mg by nebulization every 6 (six) hours as needed for wheezing or shortness of breath.    . budesonide (PULMICORT) 0.5 MG/2ML nebulizer solution Take 2 mLs (0.5 mg total) by nebulization 2 (two) times daily. 120 mL 6  . clonazePAM (KLONOPIN) 0.5 MG tablet Take 1 tablet (0.5 mg total) by mouth 2 (two) times daily as needed for anxiety. 60 tablet 2  . ipratropium (ATROVENT) 0.02 % nebulizer solution Take 0.5 mg by nebulization 4 (four) times daily. Ipratropium bromide and albuterol sulfate 0.5mg /3mg  per 3 ml    . pravastatin (PRAVACHOL) 40 MG tablet Take 1 tablet (40 mg total) by mouth daily. 30 tablet 11  . sertraline (ZOLOFT) 100 MG tablet Take 1  tablet (100 mg total) by mouth daily. 90 tablet 3  . traMADol (ULTRAM) 50 MG tablet Take 50 mg by mouth every 8 (eight) hours as needed. For pain  0  . HYDROcodone-acetaminophen (NORCO/VICODIN) 5-325 MG per tablet Take 1-2 tablets by mouth every 4 (four) hours as needed. (Patient not taking: Reported on 09/10/2014) 20 tablet 0  . ondansetron (ZOFRAN ODT) 8 MG disintegrating tablet Take 1 tablet (8 mg total) by mouth every 8 (eight) hours as needed for nausea or vomiting. (Patient not taking: Reported on 10/19/2014) 20 tablet 0   No current facility-administered medications on file prior to visit.     Past Medical History  Diagnosis Date  . Breast cancer 2007    lumpectomy, XRT previously on Tamoxifen   . Hyperlipidemia   . CHF (congestive heart failure)   . COPD (chronic obstructive pulmonary disease)     o2 at night  . Depression   . AAA (abdominal aortic aneurysm)   . Back pain     lumbar, chronic  . Hypertension   . Osteoporosis     dexa 02-18-08  . Insomnia   . Esophageal stricture   . Adenomatous colon polyp   . Hip fracture 08/2009    R, s/p ORIF  . Arthritis   . Ulcer   . Heart disease   . Diverticulosis   . Anxiety 02/15/2011  . Impaired glucose tolerance 02/18/2011  . Internal hemorrhoids   . 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  Presbyesophagus   . Gastritis   . Tubular adenoma   . Breast cancer      Past Surgical History  Procedure Laterality Date  . Oophorectomy      unilateral  . Knee arthroscopy      right   . Hemorrhoid surgery  1965  . Mastectomy, partial  2007    left  . Abdominal hysterectomy  1968  . Tonsillectomy  1968  . Rotator cuff repair    . Anal carcinoma resection    . Kyphosis surgery      balloon  . Cesarean section      x 2  . Hip fracture surgery  2011    right with plate with persistent weakness of the RLE  . Femur im nail  03/17/2012    Procedure: INTRAMEDULLARY (IM) NAIL FEMORAL;  Surgeon: Mauri Pole, MD;  Location: [facility];   Service: Orthopedics;  Laterality: Left;     Family History  Problem Relation Age of Onset  . Heart disease Mother   . Cancer Mother     bladder  . Colon cancer Father 1  . Heart disease Sister   . Breast cancer Sister   . Colon polyps Sister      Social History   Social History  . Marital Status: Divorced    Spouse Name: N/A  . Number of Children: 2  . Years of Education: N/A   Occupational History  . retired    Social History Main Topics  . Smoking status: Former Smoker -- 0.50 packs/day for 60 years    Types: Cigarettes    Start date: 04/16/1959    Quit date: 05/20/2014  . Smokeless tobacco: Never Used  . Alcohol Use: No  . Drug Use: No  . Sexual Activity: No   Other Topics Concern  . Not on file   Social History Narrative   Lives by herself--son and granddaughter brings food, limited ADL since 7/11--- still drives.  She has a cane and walker at home, which she does not use.                   ROS A 10 point review of system was performed. It is negative other than that mentioned in the history of present illness.   PHYSICAL EXAM   BP 119/80 mmHg  Pulse 95  Wt 100 lb 12 oz (45.7 kg) Constitutional: She is oriented to person, place, and time. She appears frail and underweight. No distress.  HENT: No nasal discharge.  Head: Normocephalic and atraumatic.  Eyes: Pupils are equal and round. No discharge.  Neck: Normal range of motion. Neck supple. No JVD present. No thyromegaly present.  Cardiovascular: Normal rate, regular rhythm, normal heart sounds. Exam reveals no gallop and no friction rub. No murmur heard.  Pulmonary/Chest: Effort normal and breath sounds normal. No stridor. No respiratory distress. She has no wheezes. She has no rales. She exhibits no tenderness.  Abdominal: Soft. Bowel sounds are normal. She exhibits no distension. There is no tenderness. There is no rebound and no guarding.  Musculoskeletal: Normal range of motion. She  exhibits no edema and no tenderness.  Neurological: She is alert and oriented to person, place, and time. Coordination normal.  Skin: Skin is warm and dry. No rash noted. She is not diaphoretic. No erythema. No pallor.  Psychiatric: She has a normal mood and affect. Her behavior is normal. Judgment and thought content normal.  Vascular: Femoral pulses are normal. Distal pulses  are not palpable.   EKG: Normal sinus rhythm with nonspecific T wave changes.   ASSESSMENT AND PLAN

## 2014-10-19 NOTE — Patient Instructions (Signed)
Medication Instructions:  No medication changes today  Labwork: None today  Testing/Procedures: Your physician has requested that you have a lower extremity arterial duplex. This test is an ultrasound of the arteries in the legs . It looks at arterial blood flow in the legs . Allow one hour for Lower Arterial scans. There are no restrictions or special instructions   Follow-Up: Your physician recommends that you schedule a follow-up appointment as needed with Dr Fletcher Anon.

## 2014-10-19 NOTE — Assessment & Plan Note (Addendum)
The patient was found to have an incidental finding of occluded left SFA. Currently, she complains of no calf claudication. I requested lower extremity arterial Doppler for evaluation and to establish baseline. I do not anticipate the need for revascularization given that she has minimal symptoms related to this. I recommend continuing low dose aspirin and treatment with pravastatin. I congratulated her on smoking cessation. She has no symptoms suggestive of angina.

## 2014-10-20 ENCOUNTER — Other Ambulatory Visit: Payer: Self-pay | Admitting: *Deleted

## 2014-10-20 DIAGNOSIS — I739 Peripheral vascular disease, unspecified: Secondary | ICD-10-CM

## 2014-10-20 NOTE — Addendum Note (Signed)
Addended by: Katrine Coho on: 10/20/2014 09:48 AM   Modules accepted: Orders

## 2014-10-28 ENCOUNTER — Other Ambulatory Visit: Payer: Self-pay | Admitting: Pulmonary Disease

## 2014-10-28 ENCOUNTER — Encounter (HOSPITAL_COMMUNITY): Payer: Medicare Other

## 2014-10-28 ENCOUNTER — Other Ambulatory Visit: Payer: Self-pay | Admitting: Cardiovascular Disease

## 2014-10-28 DIAGNOSIS — I739 Peripheral vascular disease, unspecified: Secondary | ICD-10-CM

## 2014-11-03 ENCOUNTER — Ambulatory Visit (HOSPITAL_COMMUNITY)
Admission: RE | Admit: 2014-11-03 | Discharge: 2014-11-03 | Disposition: A | Discharge status: Home / Self Care | Payer: Medicare Other | Source: Ambulatory Visit | Attending: Cardiovascular Disease | Admitting: Cardiovascular Disease

## 2014-11-03 DIAGNOSIS — J449 Chronic obstructive pulmonary disease, unspecified: Secondary | ICD-10-CM | POA: Insufficient documentation

## 2014-11-03 DIAGNOSIS — Z87891 Personal history of nicotine dependence: Secondary | ICD-10-CM | POA: Insufficient documentation

## 2014-11-03 DIAGNOSIS — I10 Essential (primary) hypertension: Secondary | ICD-10-CM | POA: Diagnosis not present

## 2014-11-03 DIAGNOSIS — E785 Hyperlipidemia, unspecified: Secondary | ICD-10-CM | POA: Insufficient documentation

## 2014-11-03 DIAGNOSIS — I739 Peripheral vascular disease, unspecified: Secondary | ICD-10-CM | POA: Diagnosis not present

## 2014-11-03 DIAGNOSIS — I714 Abdominal aortic aneurysm, without rupture: Secondary | ICD-10-CM | POA: Insufficient documentation

## 2014-11-17 ENCOUNTER — Ambulatory Visit: Payer: Medicare Other | Admitting: Pulmonary Disease

## 2014-11-23 ENCOUNTER — Ambulatory Visit: Payer: Medicare Other | Admitting: Cardiology

## 2014-12-04 ENCOUNTER — Encounter (HOSPITAL_COMMUNITY): Payer: Self-pay

## 2014-12-04 ENCOUNTER — Inpatient Hospital Stay (HOSPITAL_COMMUNITY)
Admission: EM | Admit: 2014-12-04 | Discharge: 2014-12-08 | DRG: 193 | Disposition: A | Discharge status: Home Health | Payer: Medicare Other | Attending: Internal Medicine | Admitting: Internal Medicine

## 2014-12-04 ENCOUNTER — Emergency Department (HOSPITAL_COMMUNITY): Payer: Medicare Other

## 2014-12-04 DIAGNOSIS — R64 Cachexia: Secondary | ICD-10-CM | POA: Diagnosis present

## 2014-12-04 DIAGNOSIS — Z85048 Personal history of other malignant neoplasm of rectum, rectosigmoid junction, and anus: Secondary | ICD-10-CM

## 2014-12-04 DIAGNOSIS — E785 Hyperlipidemia, unspecified: Secondary | ICD-10-CM | POA: Diagnosis present

## 2014-12-04 DIAGNOSIS — M549 Dorsalgia, unspecified: Secondary | ICD-10-CM | POA: Diagnosis present

## 2014-12-04 DIAGNOSIS — J439 Emphysema, unspecified: Secondary | ICD-10-CM

## 2014-12-04 DIAGNOSIS — F17210 Nicotine dependence, cigarettes, uncomplicated: Secondary | ICD-10-CM | POA: Diagnosis present

## 2014-12-04 DIAGNOSIS — I509 Heart failure, unspecified: Secondary | ICD-10-CM | POA: Diagnosis not present

## 2014-12-04 DIAGNOSIS — J9601 Acute respiratory failure with hypoxia: Secondary | ICD-10-CM | POA: Diagnosis not present

## 2014-12-04 DIAGNOSIS — J441 Chronic obstructive pulmonary disease with (acute) exacerbation: Secondary | ICD-10-CM | POA: Diagnosis present

## 2014-12-04 DIAGNOSIS — I714 Abdominal aortic aneurysm, without rupture, unspecified: Secondary | ICD-10-CM | POA: Diagnosis present

## 2014-12-04 DIAGNOSIS — Z7982 Long term (current) use of aspirin: Secondary | ICD-10-CM

## 2014-12-04 DIAGNOSIS — G8929 Other chronic pain: Secondary | ICD-10-CM | POA: Diagnosis present

## 2014-12-04 DIAGNOSIS — I739 Peripheral vascular disease, unspecified: Secondary | ICD-10-CM | POA: Diagnosis present

## 2014-12-04 DIAGNOSIS — D499 Neoplasm of unspecified behavior of unspecified site: Secondary | ICD-10-CM

## 2014-12-04 DIAGNOSIS — E875 Hyperkalemia: Secondary | ICD-10-CM | POA: Diagnosis present

## 2014-12-04 DIAGNOSIS — I272 Other secondary pulmonary hypertension: Secondary | ICD-10-CM | POA: Diagnosis present

## 2014-12-04 DIAGNOSIS — Z681 Body mass index (BMI) 19 or less, adult: Secondary | ICD-10-CM

## 2014-12-04 DIAGNOSIS — M810 Age-related osteoporosis without current pathological fracture: Secondary | ICD-10-CM | POA: Diagnosis present

## 2014-12-04 DIAGNOSIS — Z853 Personal history of malignant neoplasm of breast: Secondary | ICD-10-CM | POA: Diagnosis not present

## 2014-12-04 DIAGNOSIS — Z9981 Dependence on supplemental oxygen: Secondary | ICD-10-CM | POA: Diagnosis not present

## 2014-12-04 DIAGNOSIS — F32A Depression, unspecified: Secondary | ICD-10-CM | POA: Diagnosis present

## 2014-12-04 DIAGNOSIS — F419 Anxiety disorder, unspecified: Secondary | ICD-10-CM | POA: Diagnosis present

## 2014-12-04 DIAGNOSIS — J9621 Acute and chronic respiratory failure with hypoxia: Secondary | ICD-10-CM | POA: Diagnosis present

## 2014-12-04 DIAGNOSIS — R0602 Shortness of breath: Secondary | ICD-10-CM | POA: Diagnosis present

## 2014-12-04 DIAGNOSIS — J9611 Chronic respiratory failure with hypoxia: Secondary | ICD-10-CM

## 2014-12-04 DIAGNOSIS — J449 Chronic obstructive pulmonary disease, unspecified: Secondary | ICD-10-CM | POA: Diagnosis present

## 2014-12-04 DIAGNOSIS — I10 Essential (primary) hypertension: Secondary | ICD-10-CM | POA: Diagnosis present

## 2014-12-04 DIAGNOSIS — J189 Pneumonia, unspecified organism: Secondary | ICD-10-CM | POA: Diagnosis present

## 2014-12-04 DIAGNOSIS — R634 Abnormal weight loss: Secondary | ICD-10-CM | POA: Diagnosis present

## 2014-12-04 DIAGNOSIS — F329 Major depressive disorder, single episode, unspecified: Secondary | ICD-10-CM | POA: Diagnosis present

## 2014-12-04 LAB — CBC WITH DIFFERENTIAL/PLATELET
Basophils Absolute: 0 10*3/uL (ref 0.0–0.1)
Basophils Relative: 0 %
Eosinophils Absolute: 0 10*3/uL (ref 0.0–0.7)
Eosinophils Relative: 0 %
HEMATOCRIT: 42.3 % (ref 36.0–46.0)
HEMOGLOBIN: 12.4 g/dL (ref 12.0–15.0)
LYMPHS ABS: 0.6 10*3/uL — AB (ref 0.7–4.0)
LYMPHS PCT: 7 %
MCH: 30.2 pg (ref 26.0–34.0)
MCHC: 29.3 g/dL — AB (ref 30.0–36.0)
MCV: 103.2 fL — AB (ref 78.0–100.0)
Monocytes Absolute: 0.9 10*3/uL (ref 0.1–1.0)
Monocytes Relative: 11 %
NEUTROS PCT: 82 %
Neutro Abs: 7 10*3/uL (ref 1.7–7.7)
PLATELETS: 241 10*3/uL (ref 150–400)
RBC: 4.1 MIL/uL (ref 3.87–5.11)
RDW: 14.1 % (ref 11.5–15.5)
WBC: 8.7 10*3/uL (ref 4.0–10.5)

## 2014-12-04 LAB — URINE MICROSCOPIC-ADD ON

## 2014-12-04 LAB — BLOOD GAS, ARTERIAL
ACID-BASE EXCESS: 15.9 mmol/L — AB (ref 0.0–2.0)
Bicarbonate: 43.2 mEq/L — ABNORMAL HIGH (ref 20.0–24.0)
Drawn by: 365291
O2 Content: 6 L/min
O2 SAT: 89.5 %
PATIENT TEMPERATURE: 98.6
PCO2 ART: 96.7 mmHg — AB (ref 35.0–45.0)
TCO2: 46.2 mmol/L (ref 0–100)
pH, Arterial: 7.273 — ABNORMAL LOW (ref 7.350–7.450)
pO2, Arterial: 62.5 mmHg — ABNORMAL LOW (ref 80.0–100.0)

## 2014-12-04 LAB — COMPREHENSIVE METABOLIC PANEL
ALK PHOS: 83 U/L (ref 38–126)
ALT: 41 U/L (ref 14–54)
AST: 46 U/L — ABNORMAL HIGH (ref 15–41)
Albumin: 3.8 g/dL (ref 3.5–5.0)
Anion gap: 6 (ref 5–15)
BUN: 24 mg/dL — AB (ref 6–20)
CO2: 42 mmol/L — AB (ref 22–32)
CREATININE: 0.54 mg/dL (ref 0.44–1.00)
Calcium: 9.6 mg/dL (ref 8.9–10.3)
Chloride: 90 mmol/L — ABNORMAL LOW (ref 101–111)
GFR calc non Af Amer: >60 mL/min (ref >60)
Glucose, Bld: 189 mg/dL — ABNORMAL HIGH (ref 65–99)
Potassium: 5.7 mmol/L — ABNORMAL HIGH (ref 3.5–5.1)
SODIUM: 138 mmol/L (ref 135–145)
Total Bilirubin: 0.9 mg/dL (ref 0.3–1.2)
Total Protein: 6.5 g/dL (ref 6.5–8.1)

## 2014-12-04 LAB — URINALYSIS, ROUTINE W REFLEX MICROSCOPIC
Bilirubin Urine: NEGATIVE
Glucose, UA: NEGATIVE mg/dL
Hgb urine dipstick: NEGATIVE
KETONES UR: NEGATIVE mg/dL
LEUKOCYTES UA: NEGATIVE
NITRITE: NEGATIVE
PROTEIN: 30 mg/dL — AB
Specific Gravity, Urine: 1.015 (ref 1.005–1.030)
UROBILINOGEN UA: 0.2 mg/dL (ref 0.0–1.0)
pH: 5.5 (ref 5.0–8.0)

## 2014-12-04 LAB — I-STAT TROPONIN, ED: TROPONIN I, POC: 0 ng/mL (ref 0.00–0.08)

## 2014-12-04 LAB — MRSA PCR SCREENING: MRSA BY PCR: NEGATIVE

## 2014-12-04 LAB — GLUCOSE, CAPILLARY
Glucose-Capillary: 89 mg/dL (ref 65–99)
Glucose-Capillary: 173 mg/dL — ABNORMAL HIGH (ref 65–99)

## 2014-12-04 LAB — POTASSIUM: POTASSIUM: 4.4 mmol/L (ref 3.5–5.1)

## 2014-12-04 LAB — BRAIN NATRIURETIC PEPTIDE: B Natriuretic Peptide: 215.8 pg/mL — ABNORMAL HIGH (ref 0.0–100.0)

## 2014-12-04 LAB — I-STAT CG4 LACTIC ACID, ED
Lactic Acid, Venous: 1.47 mmol/L (ref 0.5–2.0)
Lactic Acid, Venous: 1.73 mmol/L (ref 0.5–2.0)

## 2014-12-04 MED ORDER — BUDESONIDE 0.5 MG/2ML IN SUSP: 0.5000 mg | Freq: Two times a day (BID) | RESPIRATORY_TRACT | Status: DC

## 2014-12-04 MED ORDER — CLONAZEPAM 0.5 MG PO TABS
0.5000 mg | ORAL_TABLET | Freq: Two times a day (BID) | ORAL | Status: DC | PRN
  Administered 2014-12-04 – 2014-12-05 (×4): 0.5 mg via ORAL
  Filled 2014-12-04 (×4): qty 1

## 2014-12-04 MED ORDER — DEXTROSE 10 % IV SOLN
Freq: Once | INTRAVENOUS | Status: AC
  Administered 2014-12-04: 13:00:00 via INTRAVENOUS

## 2014-12-04 MED ORDER — BUDESONIDE 0.5 MG/2ML IN SUSP
0.5000 mg | Freq: Two times a day (BID) | RESPIRATORY_TRACT | Status: DC
  Administered 2014-12-04 – 2014-12-08 (×8): 0.5 mg via RESPIRATORY_TRACT
  Filled 2014-12-04 (×9): qty 2

## 2014-12-04 MED ORDER — ASPIRIN EC 81 MG PO TBEC
81.0000 mg | DELAYED_RELEASE_TABLET | Freq: Every day | ORAL | Status: DC
  Administered 2014-12-04 – 2014-12-08 (×5): 81 mg via ORAL
  Filled 2014-12-04 (×5): qty 1

## 2014-12-04 MED ORDER — IPRATROPIUM-ALBUTEROL 0.5-2.5 (3) MG/3ML IN SOLN
3.0000 mL | Freq: Four times a day (QID) | RESPIRATORY_TRACT | Status: DC
  Administered 2014-12-04 – 2014-12-06 (×7): 3 mL via RESPIRATORY_TRACT
  Filled 2014-12-04 (×7): qty 3

## 2014-12-04 MED ORDER — ENSURE ENLIVE PO LIQD
237.0000 mL | Freq: Two times a day (BID) | ORAL | Status: DC
  Administered 2014-12-04 – 2014-12-08 (×6): 237 mL via ORAL

## 2014-12-04 MED ORDER — IPRATROPIUM-ALBUTEROL 0.5-2.5 (3) MG/3ML IN SOLN: 3.0000 mL | RESPIRATORY_TRACT | Status: DC | PRN

## 2014-12-04 MED ORDER — INSULIN ASPART 100 UNIT/ML IV SOLN
10.0000 [IU] | Freq: Once | INTRAVENOUS | Status: AC
  Administered 2014-12-04: 10 [IU] via INTRAVENOUS
  Filled 2014-12-04: qty 1

## 2014-12-04 MED ORDER — PRAVASTATIN SODIUM 40 MG PO TABS
40.0000 mg | ORAL_TABLET | Freq: Every day | ORAL | Status: DC
  Administered 2014-12-04 – 2014-12-07 (×4): 40 mg via ORAL
  Filled 2014-12-04 (×5): qty 1

## 2014-12-04 MED ORDER — TIZANIDINE HCL 2 MG PO TABS: 4.0000 mg | ORAL_TABLET | Freq: Four times a day (QID) | ORAL | Status: DC | PRN

## 2014-12-04 MED ORDER — INFLUENZA VAC SPLIT QUAD 0.5 ML IM SUSY
0.5000 mL | PREFILLED_SYRINGE | INTRAMUSCULAR | Status: AC
  Administered 2014-12-07: 0.5 mL via INTRAMUSCULAR
  Filled 2014-12-04: qty 0.5

## 2014-12-04 MED ORDER — ALBUTEROL SULFATE (2.5 MG/3ML) 0.083% IN NEBU: 5.0000 mg | INHALATION_SOLUTION | Freq: Three times a day (TID) | RESPIRATORY_TRACT | Status: DC

## 2014-12-04 MED ORDER — FAMOTIDINE 20 MG PO TABS
20.0000 mg | ORAL_TABLET | Freq: Every day | ORAL | Status: DC
  Administered 2014-12-04 – 2014-12-08 (×5): 20 mg via ORAL
  Filled 2014-12-04 (×5): qty 1

## 2014-12-04 MED ORDER — SODIUM CHLORIDE 0.9 % IV SOLN
INTRAVENOUS | Status: AC
  Administered 2014-12-04 – 2014-12-05 (×2): via INTRAVENOUS

## 2014-12-04 MED ORDER — POLYETHYLENE GLYCOL 3350 17 G PO PACK
17.0000 g | PACK | Freq: Every day | ORAL | Status: DC | PRN
  Administered 2014-12-04: 17 g via ORAL
  Filled 2014-12-04: qty 1

## 2014-12-04 MED ORDER — ALBUTEROL SULFATE (2.5 MG/3ML) 0.083% IN NEBU
5.0000 mg | INHALATION_SOLUTION | Freq: Once | RESPIRATORY_TRACT | Status: AC
  Administered 2014-12-04: 5 mg via RESPIRATORY_TRACT
  Filled 2014-12-04: qty 6

## 2014-12-04 MED ORDER — ONDANSETRON HCL 4 MG PO TABS: 4.0000 mg | ORAL_TABLET | Freq: Four times a day (QID) | ORAL | Status: DC | PRN

## 2014-12-04 MED ORDER — ENOXAPARIN SODIUM 40 MG/0.4ML ~~LOC~~ SOLN
40.0000 mg | SUBCUTANEOUS | Status: DC
  Administered 2014-12-04 – 2014-12-08 (×5): 40 mg via SUBCUTANEOUS
  Filled 2014-12-04 (×5): qty 0.4

## 2014-12-04 MED ORDER — TRAMADOL HCL 50 MG PO TABS
50.0000 mg | ORAL_TABLET | Freq: Three times a day (TID) | ORAL | Status: DC | PRN
  Administered 2014-12-06 – 2014-12-08 (×6): 50 mg via ORAL
  Filled 2014-12-04 (×7): qty 1

## 2014-12-04 MED ORDER — FUROSEMIDE 10 MG/ML IJ SOLN
40.0000 mg | Freq: Once | INTRAMUSCULAR | Status: AC
  Administered 2014-12-04: 40 mg via INTRAVENOUS
  Filled 2014-12-04: qty 4

## 2014-12-04 MED ORDER — AZITHROMYCIN 500 MG PO TABS: 250.0000 mg | ORAL_TABLET | Freq: Every day | ORAL | Status: DC

## 2014-12-04 MED ORDER — METHYLPREDNISOLONE SODIUM SUCC 125 MG IJ SOLR
125.0000 mg | Freq: Once | INTRAMUSCULAR | Status: AC
  Administered 2014-12-04: 125 mg via INTRAVENOUS
  Filled 2014-12-04: qty 2

## 2014-12-04 MED ORDER — METHYLPREDNISOLONE SODIUM SUCC 125 MG IJ SOLR
60.0000 mg | Freq: Two times a day (BID) | INTRAMUSCULAR | Status: DC
  Administered 2014-12-04 – 2014-12-05 (×2): 60 mg via INTRAVENOUS
  Filled 2014-12-04 (×2): qty 2

## 2014-12-04 MED ORDER — IPRATROPIUM-ALBUTEROL 0.5-2.5 (3) MG/3ML IN SOLN: 3.0000 mL | Freq: Four times a day (QID) | RESPIRATORY_TRACT | Status: DC

## 2014-12-04 MED ORDER — ONDANSETRON HCL 4 MG/2ML IJ SOLN: 4.0000 mg | Freq: Four times a day (QID) | INTRAMUSCULAR | Status: DC | PRN

## 2014-12-04 MED ORDER — INSULIN ASPART 100 UNIT/ML ~~LOC~~ SOLN: 0.0000 [IU] | Freq: Three times a day (TID) | SUBCUTANEOUS | Status: DC

## 2014-12-04 MED ORDER — SODIUM CHLORIDE 0.9 % IJ SOLN
3.0000 mL | Freq: Two times a day (BID) | INTRAMUSCULAR | Status: DC
  Administered 2014-12-04 – 2014-12-08 (×7): 3 mL via INTRAVENOUS

## 2014-12-04 MED ORDER — GUAIFENESIN ER 600 MG PO TB12
600.0000 mg | ORAL_TABLET | Freq: Two times a day (BID) | ORAL | Status: DC
  Administered 2014-12-04 (×2): 600 mg via ORAL
  Filled 2014-12-04 (×2): qty 1

## 2014-12-04 MED ORDER — DICYCLOMINE HCL 10 MG PO CAPS
10.0000 mg | ORAL_CAPSULE | Freq: Two times a day (BID) | ORAL | Status: DC
  Administered 2014-12-04 – 2014-12-08 (×9): 10 mg via ORAL
  Filled 2014-12-04 (×11): qty 1

## 2014-12-04 MED ORDER — AZITHROMYCIN 500 MG PO TABS
500.0000 mg | ORAL_TABLET | Freq: Every day | ORAL | Status: AC
  Administered 2014-12-04: 500 mg via ORAL
  Filled 2014-12-04: qty 1

## 2014-12-04 NOTE — ED Provider Notes (Signed)
CSN: [id number]     Arrival date & time 12/04/14  1045 History   First MD Initiated Contact with Patient 12/04/14 1055     Chief Complaint  Patient presents with  . Shortness of Breath     (Consider location/radiation/quality/duration/timing/severity/associated sxs/prior Treatment) Patient is a 81 y.o. female presenting with shortness of breath. The history is provided by the patient and medical records. No language interpreter was used.  Shortness of Breath Associated symptoms: cough   Associated symptoms: no abdominal pain, no chest pain, no diaphoresis, no fever, no headaches, no rash, no vomiting and no wheezing      Jean Mitchell is a 81 y.o. female  with a hx of breast cancer, CHF, COPD on 3 L of oxygen via nasal cannula, chronic back pain, hypertension, at home presents to the Emergency Department complaining of gradual, persistent, progressively worsening shortest breath the last 3 days. Patient reports that her granddaughter was visiting this morning and noticed her increasing shortness of breath. Granddaughter at bedside also states that patient had a blue tinge to her lips. Thus EMS was called.   Patient reports she has had an associated cough for a while.  She denies chest pain, swelling in her legs. Patient does not take Lasix at home.   Past Medical History  Diagnosis Date  . Breast cancer ([place]) 2007    lumpectomy, XRT previously on Tamoxifen   . Hyperlipidemia   . CHF (congestive heart failure) ([place])   . COPD (chronic obstructive pulmonary disease) ([facility])     o2 at night  . Depression   . AAA (abdominal aortic aneurysm) ([place])   . Back pain     lumbar, chronic  . Hypertension   . Osteoporosis     dexa 02-18-08  . Insomnia   . Esophageal stricture   . Adenomatous colon polyp   . Hip fracture ([place]) 08/2009    R, s/p ORIF  . Arthritis   . Ulcer   . Heart disease   . Diverticulosis   . Anxiety 02/15/2011  . Impaired glucose tolerance 02/18/2011  . Internal hemorrhoids    . Anal squamous cell carcinoma ([place]) 12/13/08  . Presbyesophagus   . Gastritis   . Tubular adenoma   . Breast cancer [facility])    Past Surgical History  Procedure Laterality Date  . Oophorectomy      unilateral  . Knee arthroscopy      right   . Hemorrhoid surgery  1965  . Mastectomy, partial  2007    left  . Abdominal hysterectomy  1968  . Tonsillectomy  1968  . Rotator cuff repair    . Anal carcinoma resection    . Kyphosis surgery      balloon  . Cesarean section      x 2  . Hip fracture surgery  2011    right with plate with persistent weakness of the RLE  . Femur im nail  03/17/2012    Procedure: INTRAMEDULLARY (IM) NAIL FEMORAL;  Surgeon: Mauri Pole, MD;  Location: [facility];  Service: Orthopedics;  Laterality: Left;   Family History  Problem Relation Age of Onset  . Heart disease Mother   . Cancer Mother     bladder  . Colon cancer Father 26  . Heart disease Sister   . Breast cancer Sister   . Colon polyps Sister    Social History  Substance Use Topics  . Smoking status: Former Smoker -- 0.50 packs/day for 60  years    Types: Cigarettes    Start date: 04/16/1959    Quit date: 05/20/2014  . Smokeless tobacco: Never Used  . Alcohol Use: No   OB History    No data available     Review of Systems  Constitutional: Negative for fever, diaphoresis, appetite change, fatigue and unexpected weight change.  HENT: Negative for mouth sores.   Eyes: Negative for visual disturbance.  Respiratory: Positive for cough and shortness of breath. Negative for chest tightness and wheezing.   Cardiovascular: Negative for chest pain.  Gastrointestinal: Negative for nausea, vomiting, abdominal pain, diarrhea and constipation.  Endocrine: Negative for polydipsia, polyphagia and polyuria.  Genitourinary: Negative for dysuria, urgency, frequency and hematuria.  Musculoskeletal: Negative for back pain and neck stiffness.  Skin: Negative for rash.  Allergic/Immunologic: Negative for  immunocompromised state.  Neurological: Negative for syncope, light-headedness and headaches.  Hematological: Does not bruise/bleed easily.  Psychiatric/Behavioral: Negative for sleep disturbance. The patient is not nervous/anxious.       Allergies  Gabapentin; Ezetimibe-simvastatin; and Percocet  Home Medications   Prior to Admission medications   Medication Sig Start Date End Date Taking? Authorizing Provider  albuterol (PROAIR HFA) 108 (90 BASE) MCG/ACT inhaler Inhale 2 puffs into the lungs every 6 (six) hours as needed for wheezing. 11/04/12  Yes Biagio Borg, MD  albuterol (PROVENTIL) (2.5 MG/3ML) 0.083% nebulizer solution Take 2.5 mg by nebulization every 6 (six) hours as needed for wheezing or shortness of breath.   Yes Historical Provider, MD  aspirin EC 81 MG tablet Take 1 tablet (81 mg total) by mouth daily. 10/19/14  Yes [place] Hampshire, MD  budesonide (PULMICORT) 0.5 MG/2ML nebulizer solution Take 2 mLs (0.5 mg total) by nebulization 2 (two) times daily. 08/17/14  Yes Noralee Space, MD  cephALEXin (KEFLEX) 500 MG capsule Take 500 mg by mouth 4 (four) times daily.   Yes Historical Provider, MD  clonazePAM (KLONOPIN) 0.5 MG tablet Take 1 tablet (0.5 mg total) by mouth 2 (two) times daily as needed for anxiety. 05/01/13  Yes Midge Minium, MD  dicyclomine (BENTYL) 10 MG capsule Take 10 mg by mouth 2 (two) times daily.   Yes Historical Provider, MD  ipratropium (ATROVENT) 0.02 % nebulizer solution Take 0.5 mg by nebulization 4 (four) times daily. Ipratropium bromide and albuterol sulfate 0.5mg /3mg  per 3 ml   Yes Historical Provider, MD  pravastatin (PRAVACHOL) 40 MG tablet Take 1 tablet (40 mg total) by mouth daily. 10/22/11  Yes Biagio Borg, MD  predniSONE (DELTASONE) 10 MG tablet Take 10 mg by mouth daily with breakfast.   Yes Historical Provider, MD  PROAIR HFA 108 (90 BASE) MCG/ACT inhaler INHALE 2 PUFFS INTO THE LUNGS EVERY 4 HOURS AS NEEDED FOR WHEEZING OR SHORTNESS OF BREATH.  10/28/14  Yes Noralee Space, MD  tiZANidine (ZANAFLEX) 4 MG tablet Take 4 mg by mouth every 6 (six) hours as needed for muscle spasms.   Yes Historical Provider, MD  traMADol (ULTRAM) 50 MG tablet Take 50 mg by mouth every 8 (eight) hours as needed. For pain 08/18/14  Yes Historical Provider, MD  sertraline (ZOLOFT) 100 MG tablet Take 1 tablet (100 mg total) by mouth daily. Patient not taking: Reported on 12/04/2014 02/24/13   Biagio Borg, MD   BP 113/48 mmHg  Pulse 88  Temp(Src) 97.7 F (36.5 C) (Oral)  Resp 25  SpO2 98% Physical Exam  Constitutional: She appears well-developed and well-nourished. No distress.  Awake, alert,  nontoxic appearance  HENT:  Head: Normocephalic and atraumatic.  Mouth/Throat: Oropharynx is clear and moist. No oropharyngeal exudate.  Eyes: Conjunctivae are normal. No scleral icterus.  Neck: Normal range of motion. Neck supple.  Cardiovascular: Normal rate, regular rhythm, normal heart sounds and intact distal pulses.   Pulmonary/Chest: Accessory muscle usage present. Tachypnea noted. She is in respiratory distress. She has decreased breath sounds. She has rales. She exhibits no tenderness.  Equal chest expansion Patient with tachypnea and increased work of breathing along with accessory muscle usage and monitor respiratory distress. Decreased breath sounds with rales throughout all lung fields.  Abdominal: Soft. Bowel sounds are normal. She exhibits no mass. There is no tenderness. There is no rebound and no guarding.  Soft and nontender  Musculoskeletal: Normal range of motion. She exhibits no edema.  Neurological: She is alert.  Speech is clear and goal oriented Moves extremities without ataxia  Skin: Skin is warm and dry. She is not diaphoretic.  Psychiatric: She has a normal mood and affect.  Nursing note and vitals reviewed.   ED Course  Procedures (including critical care time) Labs Review Labs Reviewed  CBC WITH DIFFERENTIAL/PLATELET - Abnormal;  Notable for the following:    MCV 103.2 (*)    MCHC 29.3 (*)    Lymphs Abs 0.6 (*)    All other components within normal limits  COMPREHENSIVE METABOLIC PANEL - Abnormal; Notable for the following:    Potassium 5.7 (*)    Chloride 90 (*)    CO2 42 (*)    Glucose, Bld 189 (*)    BUN 24 (*)    AST 46 (*)    All other components within normal limits  BRAIN NATRIURETIC PEPTIDE - Abnormal; Notable for the following:    B Natriuretic Peptide 215.8 (*)    All other components within normal limits  BLOOD GAS, ARTERIAL - Abnormal; Notable for the following:    pH, Arterial 7.273 (*)    pCO2 arterial 96.7 (*)    pO2, Arterial 62.5 (*)    Bicarbonate 43.2 (*)    Acid-Base Excess 15.9 (*)    All other components within normal limits  URINALYSIS, ROUTINE W REFLEX MICROSCOPIC (NOT AT [facility]) - Abnormal; Notable for the following:    Protein, ur 30 (*)    All other components within normal limits  URINE MICROSCOPIC-ADD ON - Abnormal; Notable for the following:    Squamous Epithelial / LPF FEW (*)    Casts HYALINE CASTS (*)    All other components within normal limits  BLOOD GAS, ARTERIAL  I-STAT TROPOININ, ED  I-STAT CG4 LACTIC ACID, ED  I-STAT ARTERIAL BLOOD GAS, ED    Imaging Review Dg Chest 2 View  12/04/2014  CLINICAL DATA:  Shortness of breath, hypertension, breast cancer, CHF, COPD, former smoker EXAM: CHEST  2 VIEW COMPARISON:  08/29/2014 FINDINGS: Enlargement of cardiac silhouette with slight dense congestion. Calcified tortuous aorta. Mediastinal contours normal. Emphysematous changes consistent with COPD. Tiny bibasilar effusions blunt the posterior costophrenic angles. Question calcified granuloma LEFT mid lung. Mild RIGHT basilar atelectasis. No infiltrate, pleural effusion or pneumothorax. Bones demineralized with multiple thoracolumbar compression fractures and prior spinal augmentation procedures at 4 levels. IMPRESSION: Enlargement of cardiac silhouette with slight vascular  congestion. COPD changes with RIGHT basilar atelectasis. Electronically Signed   By: Lavonia Dana M.D.   On: 12/04/2014 11:27   I have personally reviewed and evaluated these images and lab results as part of my medical decision-making.  EKG Interpretation   Date/Time:  Saturday December 04 2014 10:53:53 EDT Ventricular Rate:  84 PR Interval:  134 QRS Duration: 81 QT Interval:  331 QTC Calculation: 391 R Axis:   39 Text Interpretation:  Sinus rhythm Borderline T wave abnormalities No  significant change since last tracing Confirmed by KNOTT MD, Quillian Quince  ([id number]) on 12/04/2014 11:03:29 AM       CRITICAL CARE Performed by: Abigail Butts Total critical care time: 49min Critical care time was exclusive of separately billable procedures and treating other patients. Critical care was necessary to treat or prevent imminent or life-threatening deterioration. Critical care was time spent personally by me on the following activities: development of treatment plan with patient and/or surrogate as well as nursing, discussions with consultants, evaluation of patient's response to treatment, examination of patient, obtaining history from patient or surrogate, ordering and performing treatments and interventions, ordering and review of laboratory studies, ordering and review of radiographic studies, pulse oximetry and re-evaluation of patient's condition.   MDM   Final diagnoses:  Chronic hypoxemic respiratory failure (HCC)  Cigarette smoker  Pulmonary emphysema, unspecified emphysema type (HCC)  COPD exacerbation ([facility])  Hyperkalemia   George Ina resents with 3 days of shortness of breath.  Patient with hypoxia on EMS arrival.  Patient with increased work of breathing, normal air movement and fine rales in all fields. Will check ABG. Anticipate BiPAP.  Will also give lasix.    Record review shows that patient follows with Dr. Andree Moro of pulmonology for chronic hypoxemia and respiratory  failure. She is a current smoker and complains of shortness of breath and dyspnea on exertion with minimal activities and ADLs but is able to live alone, could for herself, do laundry and care for her personal needs. She isn't able to clean or walk distances.  Patient takes medications for her COPD including albuterol, and pro-air along with oxygen usage for the last 10 years.  11:35 AM ABG with evidence of increased CO2 retention. Patient be placed on BiPAP at this time.  12:46 PM Patient tolerating BiPAP without difficulty.  Negative troponin, normal lactic acid and normal white blood cell count. Patient noted to have hyperkalemia. She is receiving Lasix for presumed CHF exacerbation and albuterol for COPD exacerbation. Will add insulin and dextrose.  EKG without PT waves. Patient also given Solu-Medrol to assist with COPD exacerbation.  BNP is slightly elevated at 215.  Chest x-ray with enlargement of the cardiac silhouette but no frank pulmonary edema.    The patient was discussed with and seen by Dr. Laneta Simmers who agrees with the treatment plan.  1:20 PM Pt to be admitted to stepdown.  BP 113/48 mmHg  Pulse 88  Temp(Src) 97.7 F (36.5 C) (Oral)  Resp 25  SpO2 98%    Abigail Butts, PA-C 12/04/14 1320  Leo Grosser, MD 12/05/14 [phone]

## 2014-12-04 NOTE — ED Notes (Signed)
Pt. Unable to tolerate bedpan or other urinary interventions due to acute respiratory distress. Pt. Placed on bipap and receiving diuresis. Discussed options with PA, determined best for pt. Respiratory status and adequate I&O to use foley catheter.

## 2014-12-04 NOTE — ED Notes (Signed)
Pt. Presents with complaint of SOB. Pt. Lives independently at home. Grand daughter visiting pt this AM noted her to be increasingly SOB with blue tinge to lips. Pt. Placed on NRB by fire at home for low sats. Pt. States she has been SOB x 3 days with cough for "awhile." Hx of COPD and CHF. Pt. On 3L O2 at home.

## 2014-12-04 NOTE — H&P (Signed)
[facility] History and Physical  Jean Mitchell KXF:[id number] DOB: 1927-11-25 DOA: 12/04/2014  Referring physician: Emergency Department PCP: Tawanna Solo, MD   CHIEF COMPLAINT: shortness of breath   HPI: Jean Mitchell is a 81 y.o. female with COPD on home O2, 3 liters at night. She recently stopped smoking after 50 years. Patient comes to the emergency department today with shortness of breath. She was placed on a nonrebreather by EMS for low oxygen saturation. Shortness of breath started about 3 days ago She has a chronic cough, often productive.   Patient lives alone, never lost to go to a nursing home. She endorses recent weight loss without nausea, vomiting or abdominal pain. Her appetite is poor but does drink ensure  ED COURSE:   Vitals: Afebrile. Normotensive. Average respirations in 20's.  Patient SOB with some confusion on arrival. C02 retention on ABG's. Patient placed on BiPAP with improvement in mental status. ED gave lasix in case patient was in heart failure. Given dextrose and insulin for hyperkalemia  Labs:    ABGs pH 7.2, pC02 96, p02 62, Bicarb 43  WBC 8.7, hemoglobin 12.4. MCV 102  BUN 24 creatinine 0.5. Potassium 5.7. Troponin 0.0.   Urinalysis:  Positive for hyaline casts, nitrite negative, 0-2 WBCs  CXR:   Slight right vascular congestion, COPD with right basilar atelectasis  EKG: Today  Sinus rhythm Borderline T wave abnormalities No significant change since last tracing   Nonspecific T wave abnormalities on previous EKG in Sept 2016.    Medications  albuterol (PROVENTIL) (2.5 MG/3ML) 0.083% nebulizer solution 5 mg (not administered)  furosemide (LASIX) injection 40 mg (40 mg Intravenous Given 12/04/14 1140)  insulin aspart (novoLOG) injection 10 Units (10 Units Intravenous Given 12/04/14 1251)  dextrose 10 % infusion ( Intravenous New Bag/Given 12/04/14 1258)  methylPREDNISolone sodium succinate (SOLU-MEDROL) 125 mg/2 mL injection 125 mg  (125 mg Intravenous Given 12/04/14 1249)    REVIEW OF SYSTEMS:  Review of Systems  Constitutional: Positive for weight loss.  HENT: Negative.   Eyes: Negative.   Respiratory: Positive for cough and shortness of breath.   Cardiovascular: Negative.   Gastrointestinal: Negative.   Genitourinary: Negative.   Musculoskeletal: Negative.   Skin: Negative.   Neurological: Negative.   Endo/Heme/Allergies: Negative.   Psychiatric/Behavioral: Negative.     Past Medical History  Diagnosis Date  . Breast cancer ([place]) 2007    lumpectomy, XRT previously on Tamoxifen   . Hyperlipidemia   . CHF (congestive heart failure) ([place])   . COPD (chronic obstructive pulmonary disease) ([facility])     o2 at night  . Depression   . AAA (abdominal aortic aneurysm) ([place])   . Back pain     lumbar, chronic  . Hypertension   . Osteoporosis     dexa 02-18-08  . Insomnia   . Esophageal stricture   . Adenomatous colon polyp   . Hip fracture ([place]) 08/2009    R, s/p ORIF  . Arthritis   . Ulcer   . Heart disease   . Diverticulosis   . Anxiety 02/15/2011  . Impaired glucose tolerance 02/18/2011  . Internal hemorrhoids   . 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 Presbyesophagus   . Gastritis   . Tubular adenoma   . Breast cancer [facility])    Past Surgical History  Procedure Laterality Date  . Oophorectomy      unilateral  . Knee arthroscopy      right   . Hemorrhoid surgery  1965  . Mastectomy, partial  2007    left  . Abdominal hysterectomy  1968  . Tonsillectomy  1968  . Rotator cuff repair    . Anal carcinoma resection    . Kyphosis surgery      balloon  . Cesarean section      x 2  . Hip fracture surgery  2011    right with plate with persistent weakness of the RLE  . Femur im nail  03/17/2012    Procedure: INTRAMEDULLARY (IM) NAIL FEMORAL;  Surgeon: Mauri Pole, MD;  Location: [facility] ORS;  Service: Orthopedics;  Laterality: Left;    SOCIAL HISTORY:  reports that she quit smoking about 6  months ago. Her smoking use included Cigarettes. She started smoking about 55 years ago. She has a 30 pack-year smoking history. She has never used smokeless tobacco. She reports that she does not drink alcohol or use illicit drugs. Lives:  at home  With: Alone    Assistive devices:   Uses a walker ambulation.   Allergies  Allergen Reactions  . Gabapentin Hives    whelps  . Ezetimibe-Simvastatin     unknown  . Percocet [Oxycodone-Acetaminophen] Nausea And Vomiting    5/325 mg    Family History  Problem Relation Age of Onset  . Heart disease Mother   . Cancer Mother     bladder  . Colon cancer Father 61  . Heart disease Sister   . Breast cancer Sister   . Colon polyps Sister     Prior to Admission medications   Medication Sig Start Date End Date Taking? Authorizing Provider  albuterol (PROAIR HFA) 108 (90 BASE) MCG/ACT inhaler Inhale 2 puffs into the lungs every 6 (six) hours as needed for wheezing. 11/04/12  Yes Biagio Borg, MD  albuterol (PROVENTIL) (2.5 MG/3ML) 0.083% nebulizer solution Take 2.5 mg by nebulization every 6 (six) hours as needed for wheezing or shortness of breath.   Yes Historical Provider, MD  aspirin EC 81 MG tablet Take 1 tablet (81 mg total) by mouth daily. 10/19/14  Yes Wellington Hampshire, MD  budesonide (PULMICORT) 0.5 MG/2ML nebulizer solution Take 2 mLs (0.5 mg total) by nebulization 2 (two) times daily. 08/17/14  Yes Noralee Space, MD  cephALEXin (KEFLEX) 500 MG capsule Take 500 mg by mouth 4 (four) times daily.   Yes Historical Provider, MD  clonazePAM (KLONOPIN) 0.5 MG tablet Take 1 tablet (0.5 mg total) by mouth 2 (two) times daily as needed for anxiety. 05/01/13  Yes Midge Minium, MD  dicyclomine (BENTYL) 10 MG capsule Take 10 mg by mouth 2 (two) times daily.   Yes Historical Provider, MD  ipratropium (ATROVENT) 0.02 % nebulizer solution Take 0.5 mg by nebulization 4 (four) times daily. Ipratropium bromide and albuterol sulfate 0.5mg /3mg  per 3 ml   Yes  Historical Provider, MD  pravastatin (PRAVACHOL) 40 MG tablet Take 1 tablet (40 mg total) by mouth daily. 10/22/11  Yes Biagio Borg, MD  predniSONE (DELTASONE) 10 MG tablet Take 10 mg by mouth daily with breakfast.   Yes Historical Provider, MD  PROAIR HFA 108 (90 BASE) MCG/ACT inhaler INHALE 2 PUFFS INTO THE LUNGS EVERY 4 HOURS AS NEEDED FOR WHEEZING OR SHORTNESS OF BREATH. 10/28/14  Yes Noralee Space, MD  tiZANidine (ZANAFLEX) 4 MG tablet Take 4 mg by mouth every 6 (six) hours as needed for muscle spasms.   Yes Historical Provider, MD  traMADol (ULTRAM) 50 MG tablet Take 50  mg by mouth every 8 (eight) hours as needed. For pain 08/18/14  Yes Historical Provider, MD  sertraline (ZOLOFT) 100 MG tablet Take 1 tablet (100 mg total) by mouth daily. Patient not taking: Reported on 12/04/2014 02/24/13   Biagio Borg, MD   PHYSICAL EXAM: Filed Vitals:   12/04/14 1215 12/04/14 1230 12/04/14 1245 12/04/14 1300  BP: 151/79 140/96 145/75 125/72  Pulse: 86 83 78   Temp:      TempSrc:      Resp: 15 21 20 21   SpO2: 100% 98% 100%     Wt Readings from Last 3 Encounters:  10/19/14 45.7 kg (100 lb 12 oz)  09/10/14 48.807 kg (107 lb 9.6 oz)  08/17/14 50.168 kg (110 lb 9.6 oz)    General:  Pleasant  emaciated white female. Appears calm and comfortable Eyes: PER, normal lids, irises & conjunctiva ENT: grossly normal hearing, lips & tongue Neck: no LAD, no masses Cardiovascular: RRR, no LE edema.  Respiratory: Respirations even and unlabored. On Bipap. Significantly decreased breath sounds in bilateral upper and lower lobes. .   Abdomen: soft, non-distended, non-tender, active bowel sounds. No obvious masses.  Skin: no rash seen on limited exam Musculoskeletal: grossly normal tone BUE/BLE Psychiatric: grossly normal mood and affect, speech fluent and appropriate Neurologic: grossly non-focal.         LABS ON ADMISSION:    Basic Metabolic Panel:  Recent Labs Lab 12/04/14 1110  NA 138  K 5.7*  CL  90*  CO2 42*  GLUCOSE 189*  BUN 24*  CREATININE 0.54  CALCIUM 9.6   Liver Function Tests:  Recent Labs Lab 12/04/14 1110  AST 46*  ALT 41  ALKPHOS 83  BILITOT 0.9  PROT 6.5  ALBUMIN 3.8   CBC:  Recent Labs Lab 12/04/14 1110  WBC 8.7  NEUTROABS 7.0  HGB 12.4  HCT 42.3  MCV 103.2*  PLT 241    BNP (last 3 results)  Recent Labs  12/04/14 1110  BNP 215.8*    Radiological Exams on Admission: Dg Chest 2 View  12/04/2014  CLINICAL DATA:  Shortness of breath, hypertension, breast cancer, CHF, COPD, former smoker EXAM: CHEST  2 VIEW COMPARISON:  08/29/2014 FINDINGS: Enlargement of cardiac silhouette with slight dense congestion. Calcified tortuous aorta. Mediastinal contours normal. Emphysematous changes consistent with COPD. Tiny bibasilar effusions blunt the posterior costophrenic angles. Question calcified granuloma LEFT mid lung. Mild RIGHT basilar atelectasis. No infiltrate, pleural effusion or pneumothorax. Bones demineralized with multiple thoracolumbar compression fractures and prior spinal augmentation procedures at 4 levels. IMPRESSION: Enlargement of cardiac silhouette with slight vascular congestion. COPD changes with RIGHT basilar atelectasis. Electronically Signed   By: Lavonia Dana M.D.   On: 12/04/2014 11:27   Wt Readings from Last 3 Encounters:  10/19/14 45.7 kg (100 lb 12 oz)  09/10/14 48.807 kg (107 lb 9.6 oz)  08/17/14 50.168 kg (110 lb 9.6 oz)    ASSESSMENT / PLAN    Acute respiratory failure with hypoxia likely secondary to COPD exacerbation.  -admit to stepdown -IV steroids, Zpak, nebulizers, SSI, flutter valve  -mental status and hypoxia improved after Bipap. Will resume 02 per [place] to keep sats 88 to 92 % -Obtain echocardiogram to assess for heart failure which could be contributing to SOB though seems unlikely based on exam, labs, and CXR. Received a dose of lasix in ED. No evidence for heart failure on last echocardiogram in 2011.   Acute  hyperkalemia.  -Received insulin and dextrose in  ED. Recheck BMET this pm.   Weight loss. Down 10 pounds from early July. She endorses a poor appetite. Albumin close to normal but emaciated on exam. Would ask Dietician to see, may need more Ensure or other supplements at home.   Anxiety. Stable. Continue home Klonipin  Depression. Ten pound loss since July. Depression may be contributing factor. Continue home Zoloft.  CONSULTANTS:    Code Status: full code DVT Prophylaxis: Lovenox Family Communication: Patient alert, oriented and understands plan of care.  Disposition Plan: Discharge to home in 2-3 days    Time spent: 60 minutes Tye Savoy  NP [facility] Pager [phone]   I have examined the patient, reviewed the chart and modified the above note which I agree with. Acute on chronic resp failure due to AECOPD. Small possibility that she may have underlying CHF (large heart with straight Left heart border on CXR) but currently not in acute CHF. CXR reveals diffusely increased lung marking which could be pneumonitis, severe emphysema or mild pulm edema. Exam does not reveal crackles or JVD.  F/u ECHO.   Debbe Odea, MD

## 2014-12-05 ENCOUNTER — Inpatient Hospital Stay (HOSPITAL_COMMUNITY): Payer: Medicare Other

## 2014-12-05 ENCOUNTER — Other Ambulatory Visit (HOSPITAL_COMMUNITY): Payer: 59

## 2014-12-05 DIAGNOSIS — I714 Abdominal aortic aneurysm, without rupture, unspecified: Secondary | ICD-10-CM | POA: Diagnosis present

## 2014-12-05 DIAGNOSIS — J439 Emphysema, unspecified: Secondary | ICD-10-CM | POA: Diagnosis present

## 2014-12-05 DIAGNOSIS — Z720 Tobacco use: Secondary | ICD-10-CM

## 2014-12-05 DIAGNOSIS — E875 Hyperkalemia: Secondary | ICD-10-CM | POA: Diagnosis present

## 2014-12-05 DIAGNOSIS — I509 Heart failure, unspecified: Secondary | ICD-10-CM

## 2014-12-05 LAB — GLUCOSE, CAPILLARY
GLUCOSE-CAPILLARY: 111 mg/dL — AB (ref 65–99)
Glucose-Capillary: 107 mg/dL — ABNORMAL HIGH (ref 65–99)
Glucose-Capillary: 85 mg/dL (ref 65–99)

## 2014-12-05 LAB — BLOOD GAS, ARTERIAL
ACID-BASE EXCESS: 13 mmol/L — AB (ref 0.0–2.0)
BICARBONATE: 37.9 meq/L — AB (ref 20.0–24.0)
Drawn by: 445891
FIO2: 0.21
O2 SAT: 92.1 %
PATIENT TEMPERATURE: 98.6
PO2 ART: 61.8 mmHg — AB (ref 80.0–100.0)
TCO2: 39.6 mmol/L (ref 0–100)
pCO2 arterial: 56 mmHg — ABNORMAL HIGH (ref 35.0–45.0)
pH, Arterial: 7.445 (ref 7.350–7.450)

## 2014-12-05 LAB — CBC WITH DIFFERENTIAL/PLATELET
BASOS ABS: 0 10*3/uL (ref 0.0–0.1)
BASOS PCT: 0 %
EOS ABS: 0 10*3/uL (ref 0.0–0.7)
EOS PCT: 0 %
HEMATOCRIT: 37.7 % (ref 36.0–46.0)
Hemoglobin: 11.4 g/dL — ABNORMAL LOW (ref 12.0–15.0)
Lymphocytes Relative: 4 %
Lymphs Abs: 0.3 10*3/uL — ABNORMAL LOW (ref 0.7–4.0)
MCH: 29.8 pg (ref 26.0–34.0)
MCHC: 30.2 g/dL (ref 30.0–36.0)
MCV: 98.4 fL (ref 78.0–100.0)
MONO ABS: 0.3 10*3/uL (ref 0.1–1.0)
MONOS PCT: 3 %
NEUTROS ABS: 8.1 10*3/uL — AB (ref 1.7–7.7)
Neutrophils Relative %: 93 %
PLATELETS: 247 10*3/uL (ref 150–400)
RBC: 3.83 MIL/uL — ABNORMAL LOW (ref 3.87–5.11)
RDW: 13.9 % (ref 11.5–15.5)
WBC: 8.7 10*3/uL (ref 4.0–10.5)

## 2014-12-05 LAB — BASIC METABOLIC PANEL
ANION GAP: 11 (ref 5–15)
BUN: 26 mg/dL — ABNORMAL HIGH (ref 6–20)
CALCIUM: 9.6 mg/dL (ref 8.9–10.3)
CHLORIDE: 89 mmol/L — AB (ref 101–111)
CO2: 40 mmol/L — AB (ref 22–32)
CREATININE: 0.71 mg/dL (ref 0.44–1.00)
GFR calc non Af Amer: >60 mL/min (ref >60)
Glucose, Bld: 169 mg/dL — ABNORMAL HIGH (ref 65–99)
Potassium: 4.6 mmol/L (ref 3.5–5.1)
SODIUM: 140 mmol/L (ref 135–145)

## 2014-12-05 LAB — LIPID PANEL
Cholesterol: 223 mg/dL — ABNORMAL HIGH (ref 0–200)
HDL: 83 mg/dL (ref >40)
LDL CALC: 115 mg/dL — AB (ref 0–99)
TRIGLYCERIDES: 125 mg/dL (ref <150)
Total CHOL/HDL Ratio: 2.7 RATIO
VLDL: 25 mg/dL (ref 0–40)

## 2014-12-05 LAB — STREP PNEUMONIAE URINARY ANTIGEN: STREP PNEUMO URINARY ANTIGEN: NEGATIVE

## 2014-12-05 MED ORDER — METHYLPREDNISOLONE SODIUM SUCC 125 MG IJ SOLR
60.0000 mg | Freq: Every day | INTRAMUSCULAR | Status: DC
  Administered 2014-12-06: 60 mg via INTRAVENOUS
  Filled 2014-12-05 (×2): qty 2

## 2014-12-05 MED ORDER — DM-GUAIFENESIN ER 30-600 MG PO TB12
1.0000 | ORAL_TABLET | Freq: Two times a day (BID) | ORAL | Status: DC
  Administered 2014-12-05 – 2014-12-08 (×7): 1 via ORAL
  Filled 2014-12-05 (×7): qty 1

## 2014-12-05 MED ORDER — LEVOFLOXACIN 750 MG PO TABS
750.0000 mg | ORAL_TABLET | ORAL | Status: DC
  Administered 2014-12-05 – 2014-12-07 (×2): 750 mg via ORAL
  Filled 2014-12-05 (×3): qty 1

## 2014-12-05 MED ORDER — NICOTINE 14 MG/24HR TD PT24
14.0000 mg | MEDICATED_PATCH | TRANSDERMAL | Status: DC
  Administered 2014-12-05 – 2014-12-07 (×3): 14 mg via TRANSDERMAL
  Filled 2014-12-05 (×3): qty 1

## 2014-12-05 NOTE — Progress Notes (Signed)
Utilization review completed.  

## 2014-12-05 NOTE — Progress Notes (Signed)
[name] - Stepdown/ICU TEAM Progress Note  Jean Mitchell LFY:[id number] DOB: 24-Jan-1928 DOA: 12/04/2014 PCP: Tawanna Solo, MD  Admit HPI / Brief Narrative: Jean Mitchell is a 81 y.o. WF PMHx Depression, Anxiety, AAA, HTN, HLD Aanal Squamous Cell Carcinoma, Breast Cancer, Esophageal Stricture, COPD on home O2, 3 liters at night, Chronic Back Pain. She recently stopped smoking after 50 years. Patient comes to the emergency department today with shortness of breath. She was placed on a nonrebreather by EMS for low oxygen saturation. Shortness of breath started about 3 days ago She has a chronic cough, often productive.   Patient lives alone, never lost to go to a nursing home. She endorses recent weight loss without nausea, vomiting or abdominal pain. Her appetite is poor but does drink ensure   HPI/Subjective: 10/23 A/O 4, states uses 3 L O2 at night, and sometimes requires O2 during the day when exerting herself. States has still not totally quit smoking. Began to have increase SOB, purulent sputum ~4 days ago. Negative F/C/S.   Assessment/Plan:  Acute on chronic respiratory failure with hypoxia likely secondary to COPD exacerbation.  -mental status and hypoxia improved after Bipap. Will resume 02 per [place] to maintain sats 88 - 92 % -Echocardiogram pending  -Levofloxacin for 7 day course treatment -Flutter valve -Mucinex DM BID -Patient with questionable calcified granuloma left lung. Given patient's high risk of malignancy will obtain chest CT  Essential hypertension -Currently well-controlled -Not on any home BP medication.  Pulmonary hypertension  -Patient's BP on the low side however if in the future patient tolerates would start vasodilator -Echocardiogram pending  AAA -Abdominal Ultrasound 2013 showed aneurysm at 3 cm, patient has not had follow-up since then. -Obtain abdominal ultrasound interval change of aneurysm   HLD -Lipid panel pending  Acute  hyperkalemia.  -Resolved   Weight loss.  -Down 10 pounds from early July.  -High risk for neoplasm obtaining chest CT  -She endorses a poor appetite. Albumin close to normal but emaciated on exam.  -Dietician to see, may need more Ensure or other supplements at home.   Anxiety.  -Stable. Continue home Klonipin 0.5 mg BID PRN  Depression.  -Ten pound loss since July. Depression may be contributing factor.  -May want to consider starting medication  Tobacco abuse -Does not appear to be interested in discontinuing smoking at this time   Code Status: FULL Family Communication: Son present at time of exam Disposition Plan: Resolution COPD exacerbation    Consultants:   Procedure/Significant Events: Echocardiogram pending Abdominal ultrasound pending Chest CT pending   Culture 10/23 sputum pending 10/23 respiratory virus panel pending 10/23 influenza panel pending 10/23 strep pneumo urine antigen pending 10/23 Legionella urine antigen pending   Antibiotics: Azithromycin 10/22>> stopped 10/23 Levofloxacin 10/23>>   DVT prophylaxis: Lovenox   Devices    LINES / TUBES:      Continuous Infusions: . sodium chloride 75 mL/hr at 12/05/14 0310    Objective: VITAL SIGNS: Temp: 98.5 F (36.9 C) (10/23 0800) Temp Source: Oral (10/23 0800) BP: 122/74 mmHg (10/23 0800) Pulse Rate: 82 (10/23 0800) SPO2; FIO2:   Intake/Output Summary (Last 24 hours) at 12/05/14 1033 Last data filed at 12/05/14 0700  Gross per 24 hour  Intake 1312.5 ml  Output   2390 ml  Net -1077.5 ml     Exam: General: A/O 4, cachectic, positive acute on chronic respiratory distress Eyes: Negative headache, eye pain, double vision,negative scleral hemorrhage ENT: Negative Runny nose,  negative ear pain, negative gingival bleeding, Neck:  Negative scars, masses, torticollis, lymphadenopathy, JVD Lungs: Clear to auscultation bilaterally with decreased air movement, positive  expiratory wheezes, negative crackles Cardiovascular: Regular rate and rhythm without murmur gallop or rub normal S1 and S2 Abdomen:negative abdominal pain, nondistended, positive soft, bowel sounds, no rebound, no ascites, no appreciable mass Extremities: No significant cyanosis, clubbing, or edema bilateral lower extremities Psychiatric:  Negative depression, negative anxiety, negative fatigue, negative mania  Neurologic:  Cranial nerves II through XII intact, tongue/uvula midline, all extremities muscle strength 5/5, sensation intact throughout,  negative dysarthria, negative expressive aphasia, negative receptive aphasia.   Data Reviewed: Basic Metabolic Panel:  Recent Labs Lab 12/04/14 1110 12/04/14 1610 12/05/14 0545  NA 138  --  140  K 5.7* 4.4 4.6  CL 90*  --  89*  CO2 42*  --  40*  GLUCOSE 189*  --  169*  BUN 24*  --  26*  CREATININE 0.54  --  0.71  CALCIUM 9.6  --  9.6   Liver Function Tests:  Recent Labs Lab 12/04/14 1110  AST 46*  ALT 41  ALKPHOS 83  BILITOT 0.9  PROT 6.5  ALBUMIN 3.8   No results for input(s): LIPASE, AMYLASE in the last 168 hours. No results for input(s): AMMONIA in the last 168 hours. CBC:  Recent Labs Lab 12/04/14 1110 12/05/14 0935  WBC 8.7 8.7  NEUTROABS 7.0 8.1*  HGB 12.4 11.4*  HCT 42.3 37.7  MCV 103.2* 98.4  PLT 241 247   Cardiac Enzymes: No results for input(s): CKTOTAL, CKMB, CKMBINDEX, TROPONINI in the last 168 hours. BNP (last 3 results)  Recent Labs  12/04/14 1110  BNP 215.8*    ProBNP (last 3 results) No results for input(s): PROBNP in the last 8760 hours.  CBG:  Recent Labs Lab 12/04/14 1623 12/04/14 2157  GLUCAP 89 173*    Recent Results (from the past 240 hour(s))  MRSA PCR Screening     Status: None   Collection Time: 12/04/14  3:08 PM  Result Value Ref Range Status   MRSA by PCR NEGATIVE NEGATIVE Final    Comment:        The GeneXpert MRSA Assay (FDA approved for NASAL specimens only),  is one component of a comprehensive MRSA colonization surveillance program. It is not intended to diagnose MRSA infection nor to guide or monitor treatment for MRSA infections.      Studies:  Recent x-ray studies have been reviewed in detail by the Attending Physician  Scheduled Meds:  Scheduled Meds: . aspirin EC  81 mg Oral Daily  . budesonide  0.5 mg Nebulization BID  . dextromethorphan-guaiFENesin  1 tablet Oral BID  . dicyclomine  10 mg Oral BID  . enoxaparin (LOVENOX) injection  40 mg Subcutaneous Q24H  . famotidine  20 mg Oral Daily  . feeding supplement (ENSURE ENLIVE)  237 mL Oral BID BM  . Influenza vac split quadrivalent PF  0.5 mL Intramuscular Tomorrow-1000  . insulin aspart  0-9 Units Subcutaneous TID WC  . ipratropium-albuterol  3 mL Nebulization Q6H  . levofloxacin  750 mg Oral Q48H  . [START ON 12/06/2014] methylPREDNISolone (SOLU-MEDROL) injection  60 mg Intravenous Daily  . pravastatin  40 mg Oral Daily  . sodium chloride  3 mL Intravenous Q12H    Time spent on care of this patient: 40 mins   Tlaloc Taddei, Geraldo Docker , MD  [facility] Office  [phone] Pager [phone]  On-Call/Text Page:  [email or web address]      password [email or web address]  If 7PM-7AM, please contact night-coverage [email or web address] Password [email or web address] 12/05/2014, 10:33 AM   LOS: 1 day   Care during the described time interval was provided by me .  I have reviewed this patient's available data, including medical history, events of note, physical examination, and all test results as part of my evaluation. I have personally reviewed and interpreted all radiology studies.   Dia Crawford, MD [phone] Pager

## 2014-12-05 NOTE — Progress Notes (Signed)
Pt's family approached the RN regarding the pt's tobacco use. The family requested a nicotine patch. RN told the family the notes says she quit smoking, however the family said that was incorrect. [place] was contacted. Nicotine patch ordered and initiated.   Hart Rochester, RN, BSN

## 2014-12-06 ENCOUNTER — Inpatient Hospital Stay (HOSPITAL_COMMUNITY): Payer: Medicare Other

## 2014-12-06 LAB — INFLUENZA PANEL BY PCR (TYPE A & B)
H1N1FLUPCR: NOT DETECTED
Influenza A By PCR: NEGATIVE
Influenza B By PCR: NEGATIVE

## 2014-12-06 LAB — CBC WITH DIFFERENTIAL/PLATELET
Basophils Absolute: 0 10*3/uL (ref 0.0–0.1)
Basophils Relative: 0 %
EOS ABS: 0 10*3/uL (ref 0.0–0.7)
EOS PCT: 0 %
HCT: 36.1 % (ref 36.0–46.0)
HEMOGLOBIN: 11 g/dL — AB (ref 12.0–15.0)
LYMPHS ABS: 0.9 10*3/uL (ref 0.7–4.0)
Lymphocytes Relative: 11 %
MCH: 30.3 pg (ref 26.0–34.0)
MCHC: 30.5 g/dL (ref 30.0–36.0)
MCV: 99.4 fL (ref 78.0–100.0)
MONOS PCT: 15 %
Monocytes Absolute: 1.2 10*3/uL — ABNORMAL HIGH (ref 0.1–1.0)
Neutro Abs: 6 10*3/uL (ref 1.7–7.7)
Neutrophils Relative %: 74 %
PLATELETS: 254 10*3/uL (ref 150–400)
RBC: 3.63 MIL/uL — ABNORMAL LOW (ref 3.87–5.11)
RDW: 14.1 % (ref 11.5–15.5)
WBC: 8.2 10*3/uL (ref 4.0–10.5)

## 2014-12-06 LAB — COMPREHENSIVE METABOLIC PANEL
ALK PHOS: 68 U/L (ref 38–126)
ALT: 28 U/L (ref 14–54)
AST: 28 U/L (ref 15–41)
Albumin: 3.3 g/dL — ABNORMAL LOW (ref 3.5–5.0)
Anion gap: 4 — ABNORMAL LOW (ref 5–15)
BUN: 20 mg/dL (ref 6–20)
CALCIUM: 9.4 mg/dL (ref 8.9–10.3)
CHLORIDE: 97 mmol/L — AB (ref 101–111)
CO2: 42 mmol/L — AB (ref 22–32)
CREATININE: 0.44 mg/dL (ref 0.44–1.00)
Glucose, Bld: 99 mg/dL (ref 65–99)
Potassium: 4 mmol/L (ref 3.5–5.1)
SODIUM: 143 mmol/L (ref 135–145)
Total Bilirubin: 0.6 mg/dL (ref 0.3–1.2)
Total Protein: 5.8 g/dL — ABNORMAL LOW (ref 6.5–8.1)

## 2014-12-06 LAB — GLUCOSE, CAPILLARY
GLUCOSE-CAPILLARY: 85 mg/dL (ref 65–99)
GLUCOSE-CAPILLARY: 101 mg/dL — AB (ref 65–99)
GLUCOSE-CAPILLARY: 163 mg/dL — AB (ref 65–99)
Glucose-Capillary: 195 mg/dL — ABNORMAL HIGH (ref 65–99)

## 2014-12-06 LAB — LEGIONELLA PNEUMOPHILA SEROGP 1 UR AG
L. pneumophila Serogp 1 Ur Ag: NEGATIVE
Source of Sample: 0

## 2014-12-06 LAB — MAGNESIUM: Magnesium: 2.1 mg/dL (ref 1.7–2.4)

## 2014-12-06 MED ORDER — IPRATROPIUM-ALBUTEROL 0.5-2.5 (3) MG/3ML IN SOLN
3.0000 mL | Freq: Four times a day (QID) | RESPIRATORY_TRACT | Status: DC
  Administered 2014-12-06 – 2014-12-08 (×9): 3 mL via RESPIRATORY_TRACT
  Filled 2014-12-06 (×10): qty 3

## 2014-12-06 NOTE — Progress Notes (Signed)
Initial Nutrition Assessment  DOCUMENTATION CODES:   Underweight  INTERVENTION:   Continue Ensure Enlive po BID, each supplement provides 350 kcal and 20 grams of protein  NUTRITION DIAGNOSIS:   Inadequate oral intake related to poor appetite as evidenced by  (chart review)  GOAL:   Patient will meet greater than or equal to 90% of their needs  MONITOR:   PO intake, Supplement acceptance, Labs, Weight trends, I & O's  REASON FOR ASSESSMENT:   Consult Assessment of nutrition requirement/status  ASSESSMENT:   81 y.o. F with PMH of depression, anxiety, AAA, HTN, HLD, anal squamous cell carcinoma, breast cancer, esophageal stricture, COPD on home O2 3 liters at night, and chronic back pain. Patient came to the emergency department with shortness of breath. She was placed on a nonrebreather by EMS for low oxygen saturation.  RD unable to obtain nutrition hx.  Sleeping.  Unable to wake.  Per chart review, pt has lost 10 lbs since early July 2016.  Appetite has been poor.  Ensure Enlive oral nutrition supplements in place.    RD unable to complete Nutrition Focused Physical Exam at this time.  Suspect malnutrition, however, unable to identify at this time.  Diet Order:  Diet regular Room service appropriate?: Yes; Fluid consistency:: Thin  Skin:  Reviewed, no issues  Last BM:  N/A  Height:   Ht Readings from Last 1 Encounters:  12/04/14 5\' 6"  (1.676 m)    Weight:   Wt Readings from Last 1 Encounters:  12/04/14 100 lb 5 oz (45.5 kg)    Ideal Body Weight:  59 kg  BMI:  Body mass index is 16.2 kg/(m^2).  Estimated Nutritional Needs:   Kcal:  1100-1300  Protein:  55-65 gm  Fluid:  >/= 1.5 L  EDUCATION NEEDS:   No education needs identified at this time  Arthur Holms, RD, LDN Pager #: [phone] After-Hours Pager #: [phone]

## 2014-12-06 NOTE — Progress Notes (Signed)
[place] TEAM 1 - Stepdown/ICU TEAM PROGRESS NOTE  SAMUEL RITTENHOUSE YQM:[id number] DOB: [birth year] DOA: 12/04/2014 PCP: Tawanna Solo, MD  Admit HPI / Brief Narrative: 81 y.o. F Hx Depression, Anxiety, AAA, HTN, HLD, Aanal Squamous Cell Carcinoma, Breast Cancer, Esophageal Stricture, COPD on home O2 3 liters at night, and Chronic Back Pain. Patient came to the emergency department with shortness of breath. She was placed on a nonrebreather by EMS for low oxygen saturation.   HPI/Subjective: The patient is resting comfortably in bed.  She seems to be very mildly confused.  She denies chest pain fevers chills nausea vomiting or abdominal pain.  She tells me she lives alone.  Assessment/Plan:  Acute on chronic respiratory failure with hypoxia likely secondary to COPD exacerbation w/ possible CAP -mental status and hypoxia improved after Bipap -Levofloxacin for 7 day course -Wheezing has resolved - discontinue steroids today and follow   Questionable calcified granuloma left lung -Not evident on chest CT  Essential hypertension -Currently well-controlled -Not on any home BP medication  Pulmonary hypertension  -Patient's BP on the low side however if in the future patient tolerates would start vasodilator  AAA -Abdominal US 2013 showed aneurysm at 3 cm -repeat this admit measures at 3.1cm - recheck in 3 years if clinically indicated   Acute hyperkalemia -Resolved   Weight loss -Down 10 pounds from early July- ?pulmonary cachexia  -She endorses a poor appetite. Albumin close to normal but emaciated on exam -Dietician consult  Anxiety  -Stable - continue home Klonipin 0.5 mg BID PRN  Depression  Tobacco abuse -Does not appear to be interested in discontinuing smoking at this time  Code Status: FULL Family Communication: no family present at time of exam Disposition Plan: titrate O2 as able (likely to require home O2 during day) - PT/OT evals    Consultants: none  Procedures: 10/23 TTE - EF 60-65% - no WMA - mod to severe pulm HTN (29mm Hg)  Antibiotics: Azithromycin 10/22>10/23 Levofloxacin 10/23>  DVT prophylaxis: lovenox   Objective: Blood pressure 127/73, pulse 88, temperature 97.7 F (36.5 C), temperature source Oral, resp. rate 22, height 5\' 6"  (1.676 m), weight 45.5 kg (100 lb 5 oz), SpO2 97 %.  Intake/Output Summary (Last 24 hours) at 12/06/14 1334 Last data filed at 12/06/14 0830  Gross per 24 hour  Intake    270 ml  Output   1100 ml  Net   -830 ml   Exam: General: No acute respiratory distress - alert but very mildly confused Lungs: mild bibasilar crackles - no wheeze - distant BS th/o  Cardiovascular: Regular rate and rhythm without murmur gallop or rub normal S1 and S2 Abdomen: Nontender, nondistended, soft, bowel sounds positive, no rebound, no ascites, no appreciable mass Extremities: No significant cyanosis, clubbing, or edema bilateral lower extremities  Data Reviewed: Basic Metabolic Panel:  Recent Labs Lab 12/04/14 1110 12/04/14 1610 12/05/14 0545 12/06/14 0319  NA 138  --  140 143  K 5.7* 4.4 4.6 4.0  CL 90*  --  89* 97*  CO2 42*  --  40* 42*  GLUCOSE 189*  --  169* 99  BUN 24*  --  26* 20  CREATININE 0.54  --  0.71 0.44  CALCIUM 9.6  --  9.6 9.4  MG  --   --   --  2.1    CBC:  Recent Labs Lab 12/04/14 1110 12/05/14 0935 12/06/14 0319  WBC 8.7 8.7 8.2  NEUTROABS 7.0 8.1* 6.0  HGB 12.4 11.4* 11.0*  HCT 42.3 37.7 36.1  MCV 103.2* 98.4 99.4  PLT 241 247 254    Liver Function Tests:  Recent Labs Lab 12/04/14 1110 12/06/14 0319  AST 46* 28  ALT 41 28  ALKPHOS 83 68  BILITOT 0.9 0.6  PROT 6.5 5.8*  ALBUMIN 3.8 3.3*   CBG:  Recent Labs Lab 12/05/14 0820 12/05/14 1209 12/05/14 1627 12/06/14 0757 12/06/14 1153  GLUCAP 111* 107* 85 85 101*    Recent Results (from the past 240 hour(s))  MRSA PCR Screening     Status: None   Collection Time: 12/04/14   3:08 PM  Result Value Ref Range Status   MRSA by PCR NEGATIVE NEGATIVE Final    Comment:        The GeneXpert MRSA Assay (FDA approved for NASAL specimens only), is one component of a comprehensive MRSA colonization surveillance program. It is not intended to diagnose MRSA infection nor to guide or monitor treatment for MRSA infections.      Studies:   Recent x-ray studies have been reviewed in detail by the Attending Physician  Scheduled Meds:  Scheduled Meds: . aspirin EC  81 mg Oral Daily  . budesonide  0.5 mg Nebulization BID  . dextromethorphan-guaiFENesin  1 tablet Oral BID  . dicyclomine  10 mg Oral BID  . enoxaparin (LOVENOX) injection  40 mg Subcutaneous Q24H  . famotidine  20 mg Oral Daily  . feeding supplement (ENSURE ENLIVE)  237 mL Oral BID BM  . Influenza vac split quadrivalent PF  0.5 mL Intramuscular Tomorrow-1000  . insulin aspart  0-9 Units Subcutaneous TID WC  . ipratropium-albuterol  3 mL Nebulization Q6H  . levofloxacin  750 mg Oral Q48H  . methylPREDNISolone (SOLU-MEDROL) injection  60 mg Intravenous Daily  . nicotine  14 mg Transdermal Q24H  . pravastatin  40 mg Oral Daily  . sodium chloride  3 mL Intravenous Q12H    Time spent on care of this patient: 35 mins   Kamilla Hands T , MD   [facility] Office  [phone] Pager - Text Page per Shea Evans as per below:  On-Call/Text Page:      [email or web address]      password [email or web address]  If 7PM-7AM, please contact night-coverage [email or web address] Password [email or web address] 12/06/2014, 1:34 PM   LOS: 2 days

## 2014-12-07 DIAGNOSIS — E785 Hyperlipidemia, unspecified: Secondary | ICD-10-CM | POA: Diagnosis present

## 2014-12-07 DIAGNOSIS — J189 Pneumonia, unspecified organism: Secondary | ICD-10-CM | POA: Diagnosis not present

## 2014-12-07 DIAGNOSIS — R634 Abnormal weight loss: Secondary | ICD-10-CM

## 2014-12-07 DIAGNOSIS — I272 Other secondary pulmonary hypertension: Secondary | ICD-10-CM

## 2014-12-07 DIAGNOSIS — I10 Essential (primary) hypertension: Secondary | ICD-10-CM | POA: Diagnosis present

## 2014-12-07 LAB — RESPIRATORY VIRUS PANEL
Adenovirus: NEGATIVE
INFLUENZA A: NEGATIVE
INFLUENZA B 1: NEGATIVE
METAPNEUMOVIRUS: NEGATIVE
PARAINFLUENZA 2 A: NEGATIVE
PARAINFLUENZA 3 A: NEGATIVE
Parainfluenza 1: NEGATIVE
Respiratory Syncytial Virus A: NEGATIVE
Respiratory Syncytial Virus B: NEGATIVE
Rhinovirus: NEGATIVE

## 2014-12-07 LAB — GLUCOSE, CAPILLARY
GLUCOSE-CAPILLARY: 140 mg/dL — AB (ref 65–99)
Glucose-Capillary: 82 mg/dL (ref 65–99)

## 2014-12-07 MED ORDER — PRAVASTATIN SODIUM 40 MG PO TABS
60.0000 mg | ORAL_TABLET | Freq: Every day | ORAL | Status: DC
  Administered 2014-12-08: 60 mg via ORAL
  Filled 2014-12-07 (×2): qty 1

## 2014-12-07 NOTE — Evaluation (Signed)
Occupational Therapy Evaluation Patient Details Name: Jean Mitchell MRN: [id number] DOB: Jun 29, 1927 Today's Date: 12/07/2014    History of Present Illness 81 y.o. F Hx Depression, Anxiety, AAA, HTN, HLD, Aanal Squamous Cell Carcinoma, Breast Cancer, Esophageal Stricture, COPD on home O2 3 liters at night, and Chronic Back Pain. Patient came to the emergency department with shortness of breath.   Clinical Impression   This 81 yo female admitted with above presents to acute OT with decreased balance, decreased mobility affecting her her basic ADLs. She will benefit from acute OT with follow up [place].    Follow Up Recommendations  Home health OT    Equipment Recommendations  None recommended by OT       Precautions / Restrictions Precautions Precautions: Fall Restrictions Weight Bearing Restrictions: No      Mobility Bed Mobility Overal bed mobility: Modified Independent                Transfers Overall transfer level: Needs assistance Equipment used: Rolling walker (2 wheeled) Transfers: Sit to/from Stand Sit to Stand: Supervision              Balance Overall balance assessment: History of Falls (1 about 2 weeks ago)                                          ADL Overall ADL's : Needs assistance/impaired Eating/Feeding: Independent;Sitting   Grooming: Set up;Sitting   Upper Body Bathing: Set up;Sitting   Lower Body Bathing: Set up;Supervison/ safety;Sit to/from stand   Upper Body Dressing : Set up;Sitting   Lower Body Dressing: Set up;Supervision/safety;Sit to/from stand   Toilet Transfer: Min guard;Ambulation;RW   Toileting- Clothing Manipulation and Hygiene: Supervision/safety;Sit to/from stand               Vision Additional Comments: No change from baseline          Pertinent Vitals/Pain Pain Assessment: No/denies pain     Hand Dominance Right   Extremity/Trunk Assessment Upper Extremity Assessment Upper  Extremity Assessment: Overall WFL for tasks assessed   Lower Extremity Assessment Lower Extremity Assessment: Overall WFL for tasks assessed   Cervical / Trunk Assessment Cervical / Trunk Assessment: Kyphotic   Communication Communication Communication: No difficulties   Cognition Arousal/Alertness: Awake/alert Behavior During Therapy: WFL for tasks assessed/performed Overall Cognitive Status: Within Functional Limits for tasks assessed                                Home Living Family/patient expects to be discharged to:: Private residence Living Arrangements: Alone Available Help at Discharge: Family;Available PRN/intermittently Type of Home: Apartment Home Access: Level entry     Home Layout: One level     Bathroom Shower/Tub: Tub/shower unit Shower/tub characteristics: Door Biochemist, clinical: Standard     Home Equipment: Environmental consultant - 2 wheels;Cane - single point;Tub bench   Additional Comments: Is on 3 liters of O2 at night with prn during the day      Prior Functioning/Environment Level of Independence: Independent             OT Diagnosis: Generalized weakness   OT Problem List: Decreased strength;Impaired balance (sitting and/or standing)   OT Treatment/Interventions: Self-care/ADL training;Patient/family education;Balance training;DME and/or AE instruction    OT Goals(Current goals can be found in the care plan section) Acute Rehab  OT Goals Patient Stated Goal: to go home OT Goal Formulation: With patient Time For Goal Achievement: 12/21/14 Potential to Achieve Goals: Good  OT Frequency: Min 2X/week   Barriers to D/C: Decreased caregiver support          Co-evaluation PT/OT/SLP Co-Evaluation/Treatment: Yes     OT goals addressed during session: ADL's and self-care;Strengthening/ROM      End of Session Equipment Utilized During Treatment: Gait belt;Rolling walker;Oxygen (4 liters to start and end; 6 during ambulation) Nurse  Communication: Mobility status  Activity Tolerance: Patient tolerated treatment well Patient left: in chair;with call bell/phone within reach;with chair alarm set   Time: 4536-4680 OT Time Calculation (min): 23 min Charges:  OT General Charges $OT Visit: 1 Procedure OT Evaluation $Initial OT Evaluation Tier I: 1 Procedure  Almon Register [phone] 12/07/2014, 10:26 AM

## 2014-12-07 NOTE — Progress Notes (Signed)
Pt was able to walk around the unit with the front wheel walker ,on 3L nasal cannula with no c/o SOB,sats been 94%-97%,HR 80-104,RR 22-27/min.Spoke to the son informed about the transfer to [place] room 4.

## 2014-12-07 NOTE — Progress Notes (Signed)
Pt transferred to 6E-04 via wheelchair on [facility] accompanied by 2 RNs. No events during transfer. Upon arrival pt ambulated to bed x2 assist. Connected to [facility] with no s/s of resp distress. Report given to [facility], RN with all questions answered per receiving RN. Pt's son informed of transfer.

## 2014-12-07 NOTE — Care Management Note (Signed)
Case Management Note  Patient Details  Name: Jean Mitchell MRN: [id number] Date of Birth: 1927/11/14  Subjective/Objective:    Pt lives alone in [place] [address], has home O2 but can't recall name of supplier, has walker and cane.  Pt states she plans to go to her son's home in [place] when discharged and stay until she feels strong enough to be on her own.  RN walked pt on O2 home setting of 3L and O2 sats stayed in 90s, pt did well ambulating with walker.                              Expected Discharge Plan:  Home/Self Care  Discharge planning Services  CM Consult  Status of Service:  In process, will continue to follow  Girard Cooter, RN 12/07/2014, 3:20 PM

## 2014-12-07 NOTE — Progress Notes (Signed)
Patient transferred to 6e04. patient fully alert oriented. VSS. patient oriented to room/unit and instructed on how to use call bell. Skin assessment remarkable for skin tears. See flowsheet for documentation. patient placed on bed alarm. And droplet precautions. Will continue to monitor patient

## 2014-12-07 NOTE — Progress Notes (Signed)
[place] TEAM 1 - Stepdown/ICU TEAM Progress Note  [name] ERX:[id number] DOB: 05-Jul-1927 DOA: 12/04/2014 PCP: Tawanna Solo, MD  Admit HPI / Brief Narrative: Jean Mitchell is a 81 y.o. WF PMHx Depression, Anxiety, AAA, HTN, HLD Aanal Squamous Cell Carcinoma, Breast Cancer, Esophageal Stricture, COPD on home O2, 3 liters at night, Chronic Back Pain. She recently stopped smoking after 50 years. Patient comes to the emergency department today with shortness of breath. She was placed on a nonrebreather by EMS for low oxygen saturation. Shortness of breath started about 3 days ago She has a chronic cough, often productive.   Patient lives alone, never lost to go to a nursing home. She endorses recent weight loss without nausea, vomiting or abdominal pain. Her appetite is poor but does drink ensure   HPI/Subjective: 10/25 A/O 4,  Negative F/C/S. states ambulated the hallway does not recall what the drop in her SPO2 was but did not feel DOE. Positive continued purulent sputum   Assessment/Plan:  Acute on chronic respiratory failure with hypoxia likely secondary to COPD exacerbation.  -Continue 02 per [place] to maintain sats 88 - 92 % -Influenza panel/strep pneumo/Legionella negative -Levofloxacin for 7 day course treatment -Flutter valve -Mucinex DM BID -Chest CT; not concerning for malignancy -Obtain SPO2 ambulatory  Essential hypertension -Currently well-controlled -Not on any home BP medication.  Pulmonary hypertension  -Patient's BP on the low side however if in the future patient tolerates would start vasodilator -Echocardiogram; show pulmonary hypertension see results below   AAA -Abdominal Ultrasound 2013 showed aneurysm at 3 cm, patient has not had follow-up since then. -Abdominal ultrasound shows no change in size follow-up in 3 years   HLD -Lipid panel Not within [facility] guidelines increase pravastatin to 60 mg daily  Acute hyperkalemia.  -Resolved   Weight  loss.  -Down 10 pounds from early July.  -chest CT; negative for neoplasm  -She endorses a poor appetite. Albumin close to normal but emaciated on exam.  -Dietician to see, may need more Ensure or other supplements at home.   Anxiety.  -Stable. Continue home Klonipin 0.5 mg BID PRN  Depression.  -Ten pound loss since July. Depression may be contributing factor.  -May want to consider starting medication  Tobacco abuse -Does not appear to be interested in discontinuing smoking at this time   Code Status: FULL Family Communication: Son present at time of exam Disposition Plan: Resolution COPD exacerbation    Consultants: None  Procedure/Significant Events: 10/23 Echocardiogram - LVEF= 60% to 65%.  - Pulmonary arteries: PA peak pressure: 68 mm Hg (S). 10/23 Chest CT pending;Mild patchy bilateral lower lobe opacities, Rt>>Lt (atelectasis vs PNA). -Bilateral lower lobe bronchial wall thickening with faint tree-in-bud nodularity LLL, suggesting chronic bronchitis vs possibly aspiration. -Moderate centrilobular/paraseptal emphysematous changes. -No suspicious pulmonary nodules. 10/24 Abdominal ultrasound; AAA Maximum Diameter: 3.1 cm    Culture 10/23 sputum pending 10/23 respiratory virus panel pending 10/23 influenza panel A/B/H1N1 negative 10/23 strep pneumo urine antigen negative 10/23 Legionella urine antigen negative   Antibiotics: Azithromycin 10/22>> stopped 10/23 Levofloxacin 10/23>>   DVT prophylaxis: Lovenox   Devices    LINES / TUBES:      Continuous Infusions:    Objective: VITAL SIGNS: Temp: 97.8 F (36.6 C) (10/25 1228) Temp Source: Oral (10/25 1228) BP: 121/62 mmHg (10/25 1228) Pulse Rate: 70 (10/25 1100) SPO2; FIO2:   Intake/Output Summary (Last 24 hours) at 12/07/14 1256 Last data filed at 12/07/14 1100  Gross per 24 hour  Intake   1330 ml  Output    825 ml  Net    505 ml     Exam: General: A/O 4, cachectic, positive  chronic respiratory distress (now on home O2 settings)  Eyes: Negative headache, eye pain, double vision,negative scleral hemorrhage ENT: Negative Runny nose, negative ear pain, negative gingival bleeding, Neck:  Negative scars, masses, torticollis, lymphadenopathy, JVD Lungs: Clear to auscultation bilaterally with decreased air movement, negative expiratory wheezes, negative crackles Cardiovascular: Regular rate and rhythm without murmur gallop or rub normal S1 and S2 Abdomen:negative abdominal pain, nondistended, positive soft, bowel sounds, no rebound, no ascites, no appreciable mass Extremities: No significant cyanosis, clubbing, or edema bilateral lower extremities Psychiatric:  Negative depression, negative anxiety, negative fatigue, negative mania  Neurologic:  Cranial nerves II through XII intact, tongue/uvula midline, all extremities muscle strength 5/5, sensation intact throughout,  negative dysarthria, negative expressive aphasia, negative receptive aphasia.   Data Reviewed: Basic Metabolic Panel:  Recent Labs Lab 12/04/14 1110 12/04/14 1610 12/05/14 0545 12/06/14 0319  NA 138  --  140 143  K 5.7* 4.4 4.6 4.0  CL 90*  --  89* 97*  CO2 42*  --  40* 42*  GLUCOSE 189*  --  169* 99  BUN 24*  --  26* 20  CREATININE 0.54  --  0.71 0.44  CALCIUM 9.6  --  9.6 9.4  MG  --   --   --  2.1   Liver Function Tests:  Recent Labs Lab 12/04/14 1110 12/06/14 0319  AST 46* 28  ALT 41 28  ALKPHOS 83 68  BILITOT 0.9 0.6  PROT 6.5 5.8*  ALBUMIN 3.8 3.3*   No results for input(s): LIPASE, AMYLASE in the last 168 hours. No results for input(s): AMMONIA in the last 168 hours. CBC:  Recent Labs Lab 12/04/14 1110 12/05/14 0935 12/06/14 0319  WBC 8.7 8.7 8.2  NEUTROABS 7.0 8.1* 6.0  HGB 12.4 11.4* 11.0*  HCT 42.3 37.7 36.1  MCV 103.2* 98.4 99.4  PLT 241 247 254   Cardiac Enzymes: No results for input(s): CKTOTAL, CKMB, CKMBINDEX, TROPONINI in the last 168 hours. BNP (last  3 results)  Recent Labs  12/04/14 1110  BNP 215.8*    ProBNP (last 3 results) No results for input(s): PROBNP in the last 8760 hours.  CBG:  Recent Labs Lab 12/06/14 1153 12/06/14 1736 12/06/14 2110 12/07/14 0748 12/07/14 1226  GLUCAP 101* 195* 163* 82 140*    Recent Results (from the past 240 hour(s))  MRSA PCR Screening     Status: None   Collection Time: 12/04/14  3:08 PM  Result Value Ref Range Status   MRSA by PCR NEGATIVE NEGATIVE Final    Comment:        The GeneXpert MRSA Assay (FDA approved for NASAL specimens only), is one component of a comprehensive MRSA colonization surveillance program. It is not intended to diagnose MRSA infection nor to guide or monitor treatment for MRSA infections.      Studies:  Recent x-ray studies have been reviewed in detail by the Attending Physician  Scheduled Meds:  Scheduled Meds: . aspirin EC  81 mg Oral Daily  . budesonide  0.5 mg Nebulization BID  . dextromethorphan-guaiFENesin  1 tablet Oral BID  . dicyclomine  10 mg Oral BID  . enoxaparin (LOVENOX) injection  40 mg Subcutaneous Q24H  . famotidine  20 mg Oral Daily  . feeding supplement (ENSURE ENLIVE)  237 mL Oral BID BM  .  ipratropium-albuterol  3 mL Nebulization QID  . levofloxacin  750 mg Oral Q48H  . nicotine  14 mg Transdermal Q24H  . [START ON 12/08/2014] pravastatin  60 mg Oral Daily  . sodium chloride  3 mL Intravenous Q12H    Time spent on care of this patient: 40 mins   WOODS, Geraldo Docker , MD  [facility] Office  [phone] Pager [phone]  On-Call/Text Page:      [email or web address]      password [email or web address]  If 7PM-7AM, please contact night-coverage [email or web address] Password [email or web address] 12/07/2014, 12:56 PM   LOS: 3 days   Care during the described time interval was provided by me .  I have reviewed this patient's available data, including medical history, events of note, physical examination, and all test results as part of my evaluation. I  have personally reviewed and interpreted all radiology studies.   Dia Crawford, MD [phone] Pager

## 2014-12-07 NOTE — Evaluation (Signed)
Physical Therapy Evaluation Patient Details Name: Jean Mitchell MRN: [id number] DOB: 06-Sep-1927 Today's Date: 12/07/2014   History of Present Illness  81 y.o. F Hx Depression, Anxiety, AAA, HTN, HLD, Aanal Squamous Cell Carcinoma, Breast Cancer, Esophageal Stricture, COPD on home O2 3 liters at night, and Chronic Back Pain. Patient came to the emergency department with shortness of breath.  Clinical Impression  Patient demonstrates deficits in activity tolerance and functional mobility as indicated below. Will benefit from skilled PT to address deficits and maximize function. Will see as indicated and progress as tolerated.     Follow Up Recommendations Home health PT;Supervision - Intermittent    Equipment Recommendations  None recommended by PT    Recommendations for Other Services       Precautions / Restrictions Precautions Precautions: Fall Restrictions Weight Bearing Restrictions: No      Mobility  Bed Mobility Overal bed mobility: Modified Independent                Transfers Overall transfer level: Needs assistance Equipment used: Rolling walker (2 wheeled) Transfers: Sit to/from Stand Sit to Stand: Supervision            Ambulation/Gait Ambulation/Gait assistance: Supervision Ambulation Distance (Feet): 60 Feet Assistive device: Rolling walker (2 wheeled) Gait Pattern/deviations: Step-through pattern;Decreased stride length;Narrow base of support;Trunk flexed Gait velocity: decreased   General Gait Details: steady with ambulation. Fluctuated between 4 liters and 6 liters with saturations at rest >90.  Stairs            Wheelchair Mobility    Modified Rankin (Stroke Patients Only)       Balance Overall balance assessment: History of Falls                                           Pertinent Vitals/Pain      Home Living Family/patient expects to be discharged to:: Private residence Living Arrangements:  Alone Available Help at Discharge: Family;Available PRN/intermittently Type of Home: Other(Comment) Home Access: Level entry     Home Layout: One level Home Equipment: Walker - 2 wheels;Cane - single point;Tub bench      Prior Function Level of Independence: Independent               Hand Dominance   Dominant Hand: Right    Extremity/Trunk Assessment   Upper Extremity Assessment: Overall WFL for tasks assessed           Lower Extremity Assessment: Overall WFL for tasks assessed      Cervical / Trunk Assessment: Kyphotic  Communication   Communication: No difficulties  Cognition Arousal/Alertness: Awake/alert Behavior During Therapy: WFL for tasks assessed/performed Overall Cognitive Status: Within Functional Limits for tasks assessed                      General Comments      Exercises        Assessment/Plan    PT Assessment Patient needs continued PT services  PT Diagnosis Difficulty walking   PT Problem List Decreased strength;Decreased activity tolerance;Decreased balance;Decreased mobility;Cardiopulmonary status limiting activity  PT Treatment Interventions DME instruction;Gait training;Functional mobility training;Therapeutic activities;Therapeutic exercise;Balance training;Patient/family education   PT Goals (Current goals can be found in the Care Plan section) Acute Rehab PT Goals Patient Stated Goal: to go home PT Goal Formulation: With patient Time For Goal Achievement: 12/21/14 Potential to Achieve  Goals: Good    Frequency Min 3X/week   Barriers to discharge Decreased caregiver support      Co-evaluation               End of Session Equipment Utilized During Treatment: Gait belt;Oxygen Activity Tolerance: Patient tolerated treatment well Patient left: in chair;with call bell/phone within reach;with chair alarm set Nurse Communication: Mobility status         Time: 2229-7989 PT Time Calculation (min) (ACUTE  ONLY): 22 min   Charges:   PT Evaluation $Initial PT Evaluation Tier I: 1 Procedure     PT G CodesDuncan Dull 2014/12/30, 8:42 AM Alben Deeds, PT DPT  [phone]

## 2014-12-08 DIAGNOSIS — E875 Hyperkalemia: Secondary | ICD-10-CM

## 2014-12-08 DIAGNOSIS — F419 Anxiety disorder, unspecified: Secondary | ICD-10-CM

## 2014-12-08 DIAGNOSIS — I714 Abdominal aortic aneurysm, without rupture: Secondary | ICD-10-CM

## 2014-12-08 DIAGNOSIS — J9611 Chronic respiratory failure with hypoxia: Secondary | ICD-10-CM

## 2014-12-08 DIAGNOSIS — J9601 Acute respiratory failure with hypoxia: Secondary | ICD-10-CM

## 2014-12-08 DIAGNOSIS — J439 Emphysema, unspecified: Secondary | ICD-10-CM

## 2014-12-08 MED ORDER — LEVOFLOXACIN 750 MG PO TABS: 750.0000 mg | ORAL_TABLET | ORAL | Status: DC

## 2014-12-08 MED ORDER — DM-GUAIFENESIN ER 30-600 MG PO TB12: 1.0000 | ORAL_TABLET | Freq: Two times a day (BID) | ORAL | Status: AC

## 2014-12-08 MED ORDER — PREDNISONE 10 MG PO TABS: ORAL_TABLET | ORAL | Status: DC

## 2014-12-08 MED ORDER — PREDNISONE 10 MG PO TABS: 10.0000 mg | ORAL_TABLET | Freq: Every day | ORAL | Status: DC

## 2014-12-08 MED ORDER — FAMOTIDINE 20 MG PO TABS: 20.0000 mg | ORAL_TABLET | Freq: Every day | ORAL | Status: AC

## 2014-12-08 MED ORDER — PREDNISONE 20 MG PO TABS
40.0000 mg | ORAL_TABLET | Freq: Every day | ORAL | Status: DC
  Administered 2014-12-08: 40 mg via ORAL
  Filled 2014-12-08: qty 2

## 2014-12-08 MED ORDER — NICOTINE 14 MG/24HR TD PT24: 14.0000 mg | MEDICATED_PATCH | TRANSDERMAL | Status: AC

## 2014-12-08 MED ORDER — BUDESONIDE 0.5 MG/2ML IN SUSP: 0.5000 mg | Freq: Two times a day (BID) | RESPIRATORY_TRACT | Status: DC

## 2014-12-08 NOTE — Care Management Important Message (Signed)
Important Message  Patient Details  Name: Jean Mitchell MRN: [id number] Date of Birth: 12-11-1927   Medicare Important Message Given:  Yes-second notification given    RoyalRory Percy, RN 12/08/2014, 3:03 PM

## 2014-12-08 NOTE — Discharge Summary (Signed)
Physician Discharge Summary   Patient ID: Jean Mitchell MRN: [id number] DOB/AGE: May 17, 1927 81 y.o.  Admit date: 12/04/2014 Discharge date: 12/08/2014  Primary Care Physician:  Tawanna Solo, MD  Discharge Diagnoses:    . COPD exacerbation ([place]) . Acute respiratory failure with hypoxia ([place]) . Chronic hypoxemic respiratory failure ([place]) . COPD (chronic obstructive pulmonary disease) ([place]) . PAD (peripheral artery disease) ([place]) . Weight loss . Acute hyperkalemia . Depression . Anxiety . AAA (abdominal aortic aneurysm) ([place]) . Essential hypertension . Pulmonary hypertension ([place]) . HLD (hyperlipidemia)  Consults:  None    Recommendations for Outpatient Follow-up:  Patient was recommended to quit smoking  Patient was discharged on prednisone taper, Levaquin and continued on home inhalers/nebulizers  TESTS THAT NEED FOLLOW-UP None    DIET: Heart healthy diet    Allergies:   Allergies  Allergen Reactions  . Gabapentin Hives    whelps  . Percocet [Oxycodone-Acetaminophen] Nausea And Vomiting    5/325 mg     Discharge Medications:   Medication List    TAKE these medications        albuterol (2.5 MG/3ML) 0.083% nebulizer solution  Commonly known as:  PROVENTIL  Take 2.5 mg by nebulization every 6 (six) hours as needed for wheezing or shortness of breath.     albuterol 108 (90 BASE) MCG/ACT inhaler  Commonly known as:  PROAIR HFA  Inhale 2 puffs into the lungs every 6 (six) hours as needed for wheezing.     PROAIR HFA 108 (90 BASE) MCG/ACT inhaler  Generic drug:  albuterol  INHALE 2 PUFFS INTO THE LUNGS EVERY 4 HOURS AS NEEDED FOR WHEEZING OR SHORTNESS OF BREATH.     aspirin EC 81 MG tablet  Take 1 tablet (81 mg total) by mouth daily.     budesonide 0.5 MG/2ML nebulizer solution  Commonly known as:  PULMICORT  Take 2 mLs (0.5 mg total) by nebulization 2 (two) times daily.     clonazePAM 0.5 MG tablet  Commonly known as:  KLONOPIN  Take 1  tablet (0.5 mg total) by mouth 2 (two) times daily as needed for anxiety.     dextromethorphan-guaiFENesin 30-600 MG 12hr tablet  Commonly known as:  MUCINEX DM  Take 1 tablet by mouth 2 (two) times daily.     dicyclomine 10 MG capsule  Commonly known as:  BENTYL  Take 10 mg by mouth 2 (two) times daily.     famotidine 20 MG tablet  Commonly known as:  PEPCID  Take 1 tablet (20 mg total) by mouth daily.     ipratropium 0.02 % nebulizer solution  Commonly known as:  ATROVENT  Take 0.5 mg by nebulization 4 (four) times daily. Ipratropium bromide and albuterol sulfate 0.5mg /3mg  per 3 ml     levofloxacin 750 MG tablet  Commonly known as:  LEVAQUIN  Take 1 tablet (750 mg total) by mouth every other day. 4 more doses     nicotine 14 mg/24hr patch  Commonly known as:  NICODERM CQ - dosed in mg/24 hours  Place 1 patch (14 mg total) onto the skin daily.     pravastatin 40 MG tablet  Commonly known as:  PRAVACHOL  Take 1 tablet (40 mg total) by mouth daily.     predniSONE 10 MG tablet  Commonly known as:  DELTASONE  Prednisone dosing: Take  Prednisone 40mg  (4 tabs) x 2 days, then taper to 30mg  (3 tabs) x 3 days, then 20mg  (2 tabs) x 3days, then 10mg  (  1 tab) x 3days, then OFF.  Start taking on:  12/09/2014     tiZANidine 4 MG tablet  Commonly known as:  ZANAFLEX  Take 4 mg by mouth every 6 (six) hours as needed for muscle spasms.     traMADol 50 MG tablet  Commonly known as:  ULTRAM  Take 50 mg by mouth every 8 (eight) hours as needed. For pain         Brief H and P: For complete details please refer to admission H and P, but in brief Jean Mitchell is a 81 y.o. WF PMHx Depression, Anxiety, AAA, HTN, HLD Aanal Squamous Cell Carcinoma, Breast Cancer, Esophageal Stricture, COPD on home O2, 3 liters at night, Chronic Back Pain. She recently stopped smoking after 50 years. Patient comes to the emergency department today with shortness of breath. She was placed on a nonrebreather by  EMS for low oxygen saturation. Shortness of breath started about 3 days ago She has a chronic cough, often productive.   Patient lives alone, never lost to go to a nursing home. She endorses recent weight loss without nausea, vomiting or abdominal pain. Her appetite is poor but does drink ensure.  Hospital Course:  Acute on chronic respiratory failure with hypoxia likely secondary to COPD exacerbation.  Patient was admitted to stepdown unit, improved after BiPAP. Patient was placed on IV levofloxacin, flutter valve, Mucinex, IV steroids. She was subsequently transferred to monitored floor and continued to improve. Influenza panel, urine strep antigen, urine legionella antigen negative. Patient was discharged on oral levofloxacin to complete a 7 day course, prednisone with taper, continued on Pulmicort and DuoNeb's. Home O2 evaluation will be done prior to discharge. Patient was strongly recommended to follow-up with her pulmonologist, Dr. Lenna Gilford in 10 days.   Essential hypertension -Currently well-controlled -Not on any home BP medication.  Pulmonary hypertension  -Patient's BP on the low side however if in the future patient tolerates would start vasodilator - 2-D echo on 10/23 showed EF of 60-65%, moderately to severely increased pulmonary artery pressure, 68 mmHg   AAA -Abdominal Ultrasound 2013 showed aneurysm at 3 cm, patient has not had follow-up since then. -Abdominal ultrasound shows no change in size, follow-up in 3 years   HLD -Continue Pravachol  Acute hyperkalemia.  -Resolved   Weight loss.  - CT of the chest negative for any malignancy, positive for chronic bronchitis. -She endorses a poor appetite. Albumin close to normal but emaciated on exam.   Anxiety.  -Stable. Continue home Klonipin 0.5 mg BID PRN  Tobacco abuse -Does not appear to be interested in discontinuing smoking at this time. The patient was given prescription for nicotine patch.  Patient  declined home health services   Day of Discharge BP 138/73 mmHg  Pulse 76  Temp(Src) 98.4 F (36.9 C) (Oral)  Resp 18  Ht 5\' 6"  (1.676 m)  Wt 46.04 kg (101 lb 8 oz)  BMI 16.39 kg/m2  SpO2 96%  Physical Exam: General: Alert and awake oriented x3 not in any acute distress, Frail and cachetic appearing. HEENT: anicteric sclera, pupils reactive to light and accommodation CVS: S1-S2 clear no murmur rubs or gallops Chest: clear to auscultation bilaterally, no wheezing rales or rhonchi Abdomen: soft nontender, nondistended, normal bowel sounds Extremities: no cyanosis, clubbing or edema noted bilaterally Neuro: Cranial nerves II-XII intact, no focal neurological deficits   The results of significant diagnostics from this hospitalization (including imaging, microbiology, ancillary and laboratory) are listed below for reference.  LAB RESULTS: Basic Metabolic Panel:  Recent Labs Lab 12/05/14 0545 12/06/14 0319  NA 140 143  K 4.6 4.0  CL 89* 97*  CO2 40* 42*  GLUCOSE 169* 99  BUN 26* 20  CREATININE 0.71 0.44  CALCIUM 9.6 9.4  MG  --  2.1   Liver Function Tests:  Recent Labs Lab 12/04/14 1110 12/06/14 0319  AST 46* 28  ALT 41 28  ALKPHOS 83 68  BILITOT 0.9 0.6  PROT 6.5 5.8*  ALBUMIN 3.8 3.3*   No results for input(s): LIPASE, AMYLASE in the last 168 hours. No results for input(s): AMMONIA in the last 168 hours. CBC:  Recent Labs Lab 12/05/14 0935 12/06/14 0319  WBC 8.7 8.2  NEUTROABS 8.1* 6.0  HGB 11.4* 11.0*  HCT 37.7 36.1  MCV 98.4 99.4  PLT 247 254   Cardiac Enzymes: No results for input(s): CKTOTAL, CKMB, CKMBINDEX, TROPONINI in the last 168 hours. BNP: Invalid input(s): POCBNP CBG:  Recent Labs Lab 12/07/14 0748 12/07/14 1226  GLUCAP 82 140*    Significant Diagnostic Studies:  Dg Chest 2 View  12/04/2014  CLINICAL DATA:  Shortness of breath, hypertension, breast cancer, CHF, COPD, former smoker EXAM: CHEST  2 VIEW COMPARISON:   08/29/2014 FINDINGS: Enlargement of cardiac silhouette with slight dense congestion. Calcified tortuous aorta. Mediastinal contours normal. Emphysematous changes consistent with COPD. Tiny bibasilar effusions blunt the posterior costophrenic angles. Question calcified granuloma LEFT mid lung. Mild RIGHT basilar atelectasis. No infiltrate, pleural effusion or pneumothorax. Bones demineralized with multiple thoracolumbar compression fractures and prior spinal augmentation procedures at 4 levels. IMPRESSION: Enlargement of cardiac silhouette with slight vascular congestion. COPD changes with RIGHT basilar atelectasis. Electronically Signed   By: Lavonia Dana M.D.   On: 12/04/2014 11:27   Ct Chest Wo Contrast  12/05/2014  CLINICAL DATA:  Possible left lung granuloma on chest radiographs EXAM: CT CHEST WITHOUT CONTRAST TECHNIQUE: Multidetector CT imaging of the chest was performed following the standard protocol without IV contrast. COMPARISON:  Chest radiographs dated 12/04/2014 FINDINGS: Mediastinum/Nodes: The heart is normal in size. Small anterior pericardial fluid. Coronary atherosclerosis. Atherosclerotic calcifications of the aortic arch. No suspicious mediastinal or axillary lymphadenopathy. Visualized thyroid is unremarkable. Lungs/Pleura: Mild patchy bilateral lower lobe opacities, right greater than left, atelectasis versus pneumonia. Trace bilateral pleural effusions. Additional minimal faint ground-glass tree-in-bud nodularity in the left lower lobe (series 3/ image 32). Associated bronchial wall thickening in in the bilateral lower lobes. This appearance suggests chronic bronchitis or possibly aspiration. Underlying moderate centrilobular and paraseptal emphysematous changes. Mild mosaic attenuation. No suspicious pulmonary nodules. No pneumothorax. Upper abdomen: Visualized upper abdomen is notable for vascular calcifications, a 4 mm right renal parenchymal calcification, and ectasia of the infrarenal  abdominal aorta measuring up to 2.7 cm (incompletely visualized. Musculoskeletal: Multilevel vertebral augmentation of the visualized thoracolumbar spine. Mild multilevel degenerative changes. Old sternal fracture deformity. IMPRESSION: Mild patchy bilateral lower lobe opacities, right greater than left, atelectasis versus pneumonia. Trace bilateral pleural effusions. Bilateral lower lobe bronchial wall thickening with faint tree-in-bud nodularity in the left lower lobe, suggesting chronic bronchitis or possibly aspiration. Underlying moderate centrilobular and paraseptal emphysematous changes. No suspicious pulmonary nodules. Electronically Signed   By: Julian Hy M.D.   On: 12/05/2014 13:24    2D ECHO: Study Conclusions  - Left ventricle: The cavity size was normal. Wall thickness was normal. Systolic function was normal. The estimated ejection fraction was in the range of 60% to 65%. Wall motion was normal; there were  no regional wall motion abnormalities. Doppler parameters are consistent with elevated mean left atrial filling pressure. - Aortic valve: There was trivial regurgitation. - Atrial septum: No defect or patent foramen ovale was identified. - Pulmonary arteries: Systolic pressure was moderately to severely increased. PA peak pressure: 68 mm Hg (S).  Disposition and Follow-up:     Discharge Instructions    Diet - low sodium heart healthy    Complete by:  As directed      Discharge instructions    Complete by:  As directed   Please donot smoke  Continue DUONEB (albuterol and Atrovent nebs) 4 x daily,  Pulmicort Nebs 2 times daily Albuterol Inhaler or  Nebs every 4 hours as needed for shortness of breath and wheezing.     Increase activity slowly    Complete by:  As directed             DISPOSITION: HOME    DISCHARGE FOLLOW-UP Follow-up Information    Follow up with Tawanna Solo, MD. Schedule an appointment as soon as possible for a visit in  2 weeks.   Specialty:  Family Medicine   Why:  for hospital follow-up   Contact information:   [place] [address] [phone]       Follow up with NADEL,SCOTT M, MD. Schedule an appointment as soon as possible for a visit in 10 days.   Specialty:  Pulmonary Disease   Why:  for hospital follow-up   Contact information:   [place] [address] [phone]        Time spent on Discharge: 77 MINS   Signed:   Hassel Uphoff M.D. [facility] 12/08/2014, 11:25 AM Pager: [phone]

## 2014-12-08 NOTE — Progress Notes (Signed)
Jean Mitchell to be D/C'd Home per MD order.  Discussed prescriptions and follow up appointments with the patient. Prescriptions given to patient, medication list explained in detail. Pt verbalized understanding.    Medication List    TAKE these medications        albuterol (2.5 MG/3ML) 0.083% nebulizer solution  Commonly known as:  PROVENTIL  Take 2.5 mg by nebulization every 6 (six) hours as needed for wheezing or shortness of breath.     albuterol 108 (90 BASE) MCG/ACT inhaler  Commonly known as:  PROAIR HFA  Inhale 2 puffs into the lungs every 6 (six) hours as needed for wheezing.     PROAIR HFA 108 (90 BASE) MCG/ACT inhaler  Generic drug:  albuterol  INHALE 2 PUFFS INTO THE LUNGS EVERY 4 HOURS AS NEEDED FOR WHEEZING OR SHORTNESS OF BREATH.     aspirin EC 81 MG tablet  Take 1 tablet (81 mg total) by mouth daily.     budesonide 0.5 MG/2ML nebulizer solution  Commonly known as:  PULMICORT  Take 2 mLs (0.5 mg total) by nebulization 2 (two) times daily.     clonazePAM 0.5 MG tablet  Commonly known as:  KLONOPIN  Take 1 tablet (0.5 mg total) by mouth 2 (two) times daily as needed for anxiety.     dextromethorphan-guaiFENesin 30-600 MG 12hr tablet  Commonly known as:  MUCINEX DM  Take 1 tablet by mouth 2 (two) times daily.     dicyclomine 10 MG capsule  Commonly known as:  BENTYL  Take 10 mg by mouth 2 (two) times daily.     famotidine 20 MG tablet  Commonly known as:  PEPCID  Take 1 tablet (20 mg total) by mouth daily.     ipratropium 0.02 % nebulizer solution  Commonly known as:  ATROVENT  Take 0.5 mg by nebulization 4 (four) times daily. Ipratropium bromide and albuterol sulfate 0.5mg /3mg  per 3 ml     levofloxacin 750 MG tablet  Commonly known as:  LEVAQUIN  Take 1 tablet (750 mg total) by mouth every other day. 4 more doses     nicotine 14 mg/24hr patch  Commonly known as:  NICODERM CQ - dosed in mg/24 hours  Place 1 patch (14 mg total) onto the skin daily.     pravastatin 40 MG tablet  Commonly known as:  PRAVACHOL  Take 1 tablet (40 mg total) by mouth daily.     predniSONE 10 MG tablet  Commonly known as:  DELTASONE  Prednisone dosing: Take  Prednisone 40mg  (4 tabs) x 2 days, then taper to 30mg  (3 tabs) x 3 days, then 20mg  (2 tabs) x 3days, then 10mg  (1 tab) x 3days, then OFF.  Start taking on:  12/09/2014     tiZANidine 4 MG tablet  Commonly known as:  ZANAFLEX  Take 4 mg by mouth every 6 (six) hours as needed for muscle spasms.     traMADol 50 MG tablet  Commonly known as:  ULTRAM  Take 50 mg by mouth every 8 (eight) hours as needed. For pain        Filed Vitals:   12/08/14 1605  BP:   Pulse: 78  Temp:   Resp: 18    Skin clean, dry and intact without evidence of skin break down, no evidence of skin tears noted. IV catheter discontinued intact. Site without signs and symptoms of complications. Dressing and pressure applied. Pt denies pain at this time. No complaints noted.  An After  Visit Summary was printed and given to the patient. Patient escorted via [place], and D/C home via private auto.  [name] BSN, RN

## 2014-12-08 NOTE — Care Management Note (Addendum)
Case Management Note  Patient Details  Name: Jean Mitchell MRN: [id number] Date of Birth: 11/07/1927  Subjective/Objective:       CM following for progression and d/c planning.             Action/Plan: 12/08/2014 Met with pt and discussed d/c plans, pt will d/c to son's home , he has moved her concentrator to his home , this CM encouraged pt to call son to pick up O2 tank at pt home to use at time of d/c for drive home. Pt is declining HH services, as she is independent at home.  Received call from pt daughter in law re [facility] services , she is hopeful for [facility] aide services for this pt due to pt frequent falls. This CM discussed HH as covered by Medicare and informed pt family member that [facility] aide services are only available if HHRN or HHPT is required and then only for short periods. This CM doubtful that [facility] aide services would be covered for this pt as she has no skilled needs that would require ongoing [place] or [place] visits. Discussed possible Medicaid application for this pt and explained the process to the daughter in law, who will pursue after pt is d/c.  Spoke with pt son, who will talk to his mother re HH. 4:30 pm Son now requesting [facility] and [place] visit, MD notified and [facility] notified. Pt to d/c to home of son : Jean Mitchell  [address] [place], [place] [address] phone number to schedule visit is son's number [phone]  Expected Discharge Date:     12/08/2014             Expected Discharge Plan:  Home with [facility] services  In-House Referral:  NA  Discharge planning Services  CM Consult  Post Acute Care Choice:   [facility]  Choice offered to:   Pt son  DME Arranged:   NA DME Agency:   NA  HH Arranged:   HHRN and HHPT [place] Agency:   [facility]  Status of Service:  Complete  Medicare Important Message Given:    Date Medicare IM Given:    Medicare IM give by:    Date Additional Medicare IM Given:    Additional Medicare Important Message give by:     If discussed at [place] of Stay Meetings, dates  discussed:    Additional Comments:  Adron Bene, RN 12/08/2014, 11:06 AM

## 2014-12-23 ENCOUNTER — Inpatient Hospital Stay: Payer: 59 | Admitting: Pulmonary Disease

## 2015-01-18 ENCOUNTER — Ambulatory Visit (INDEPENDENT_AMBULATORY_CARE_PROVIDER_SITE_OTHER): Payer: Medicare Other | Admitting: Adult Health

## 2015-01-18 ENCOUNTER — Telehealth: Payer: Self-pay | Admitting: Adult Health

## 2015-01-18 ENCOUNTER — Ambulatory Visit (INDEPENDENT_AMBULATORY_CARE_PROVIDER_SITE_OTHER)
Admission: RE | Admit: 2015-01-18 | Discharge: 2015-01-18 | Disposition: A | Discharge status: Home / Self Care | Payer: Medicare Other | Source: Ambulatory Visit | Attending: Adult Health | Admitting: Adult Health

## 2015-01-18 ENCOUNTER — Encounter: Payer: Self-pay | Admitting: Adult Health

## 2015-01-18 VITALS — BP 118/76 | HR 93 | Temp 98.2°F | Ht 65.0 in | Wt 88.0 lb

## 2015-01-18 DIAGNOSIS — J449 Chronic obstructive pulmonary disease, unspecified: Secondary | ICD-10-CM

## 2015-01-18 DIAGNOSIS — J439 Emphysema, unspecified: Secondary | ICD-10-CM | POA: Diagnosis not present

## 2015-01-18 DIAGNOSIS — Z720 Tobacco use: Secondary | ICD-10-CM | POA: Diagnosis not present

## 2015-01-18 DIAGNOSIS — F419 Anxiety disorder, unspecified: Secondary | ICD-10-CM

## 2015-01-18 DIAGNOSIS — F17210 Nicotine dependence, cigarettes, uncomplicated: Secondary | ICD-10-CM

## 2015-01-18 DIAGNOSIS — I272 Other secondary pulmonary hypertension: Secondary | ICD-10-CM

## 2015-01-18 DIAGNOSIS — J9611 Chronic respiratory failure with hypoxia: Secondary | ICD-10-CM | POA: Diagnosis not present

## 2015-01-18 DIAGNOSIS — J441 Chronic obstructive pulmonary disease with (acute) exacerbation: Secondary | ICD-10-CM

## 2015-01-18 DIAGNOSIS — J9601 Acute respiratory failure with hypoxia: Secondary | ICD-10-CM

## 2015-01-18 MED ORDER — BUDESONIDE 0.5 MG/2ML IN SUSP: 0.5000 mg | Freq: Two times a day (BID) | RESPIRATORY_TRACT | Status: AC

## 2015-01-18 MED ORDER — ALBUTEROL SULFATE (2.5 MG/3ML) 0.083% IN NEBU: 2.5000 mg | INHALATION_SOLUTION | Freq: Four times a day (QID) | RESPIRATORY_TRACT | Status: AC | PRN

## 2015-01-18 MED ORDER — IPRATROPIUM BROMIDE 0.02 % IN SOLN: 0.5000 mg | Freq: Four times a day (QID) | RESPIRATORY_TRACT | Status: AC

## 2015-01-18 NOTE — Telephone Encounter (Signed)
Per [facility] note updated today by [name], pt wears 02 with exertion and at bedtime.    lmtcb X1 for Melissa to make aware.

## 2015-01-18 NOTE — Patient Instructions (Addendum)
Continue on current regimen.  Chest xray today  Order for oxygen to [facility] .  Follow up Dr. Lenna Gilford  In 3 months and As needed

## 2015-01-18 NOTE — Telephone Encounter (Signed)
Spoke with Jean Mitchell, she is going to let [name] know about this so we can handle everything the pt needs while she is here today. Nothing further was needed.

## 2015-01-19 NOTE — Progress Notes (Signed)
Subjective:    Patient ID: Jean Mitchell, female    DOB: [birth year], 81 y.o.   MRN: [id number]  HPI 81 yo female with COPD GOLD 3 , former smoker (05/2014) ,pulmonary HTN, hx of breast and anal cancer,   01/18/15 [facility] follow up   Pt returns for a post hospital follow up  Admitted 12/04/14 for a COPD exacerbation and acute on chronic resp failure  She was treated with initial BIPAP support , IV abx and steroids .  Echo showed worsening of her pulmonary HTN with PAP 68 .  CT chest showed Bilateral LL opacities R>L , mod emphysema.  She was discharged on Levaquin and steroid taper.  Since discharge she says she is doing okay. Gets winded easily  Has minimally productive cough that is at baseline.  On O2 at 2l/m .  No fever, discolored mucus, hemoptysis, or orthopnea or edema.  Needs new order to [facility] for oxygen -changing DME companies .    Past Medical History  Diagnosis Date  . Breast cancer ([place]) 2007    lumpectomy, XRT previously on Tamoxifen   . Hyperlipidemia   . CHF (congestive heart failure) ([place])   . COPD (chronic obstructive pulmonary disease) ([facility])     o2 at night  . Depression   . AAA (abdominal aortic aneurysm) ([place])   . Back pain     lumbar, chronic  . Hypertension   . Osteoporosis     dexa 02-18-08  . Insomnia   . Esophageal stricture   . Adenomatous colon polyp   . Hip fracture ([place]) 08/2009    R, s/p ORIF  . Arthritis   . Ulcer   . Heart disease   . Diverticulosis   . Anxiety 02/15/2011  . Impaired glucose tolerance 02/18/2011  . Internal hemorrhoids   . Anal squamous cell carcinoma ([place]) 12/13/08  . Presbyesophagus   . Gastritis   . Tubular adenoma   . Breast cancer [facility])    Current Outpatient Prescriptions on File Prior to Visit  Medication Sig Dispense Refill  . albuterol (PROAIR HFA) 108 (90 BASE) MCG/ACT inhaler Inhale 2 puffs into the lungs every 6 (six) hours as needed for wheezing. 8.5 g 11  . aspirin EC 81 MG tablet Take 1 tablet (81 mg  total) by mouth daily.    [place] Kitchen dextromethorphan-guaiFENesin (MUCINEX DM) 30-600 MG 12hr tablet Take 1 tablet by mouth 2 (two) times daily. 30 tablet 0  . nicotine (NICODERM CQ - DOSED IN MG/24 HOURS) 14 mg/24hr patch Place 1 patch (14 mg total) onto the skin daily. 28 patch 3  . pravastatin (PRAVACHOL) 40 MG tablet Take 1 tablet (40 mg total) by mouth daily. 30 tablet 11  . PROAIR HFA 108 (90 BASE) MCG/ACT inhaler INHALE 2 PUFFS INTO THE LUNGS EVERY 4 HOURS AS NEEDED FOR WHEEZING OR SHORTNESS OF BREATH. 8.5 g 3  . clonazePAM (KLONOPIN) 0.5 MG tablet Take 1 tablet (0.5 mg total) by mouth 2 (two) times daily as needed for anxiety. (Patient not taking: Reported on 01/18/2015) 60 tablet 2  . dicyclomine (BENTYL) 10 MG capsule Take 10 mg by mouth 2 (two) times daily.    . famotidine (PEPCID) 20 MG tablet Take 1 tablet (20 mg total) by mouth daily. (Patient not taking: Reported on 01/18/2015) 30 tablet 2  . tiZANidine (ZANAFLEX) 4 MG tablet Take 4 mg by mouth every 6 (six) hours as needed for muscle spasms.    . traMADol (ULTRAM) 50 MG  tablet Take 50 mg by mouth every 8 (eight) hours as needed. For pain  0   No current facility-administered medications on file prior to visit.     Review of Systems Constitutional:   No  weight loss, night sweats,  Fevers, chills,  +fatigue, or  lassitude.  HEENT:   No headaches,  Difficulty swallowing,  Tooth/dental problems, or  Sore throat,                No sneezing, itching, ear ache, nasal congestion, post nasal drip,   CV:  No chest pain,  Orthopnea, PND, swelling in lower extremities, anasarca, dizziness, palpitations, syncope.   GI  No heartburn, indigestion, abdominal pain, nausea, vomiting, diarrhea, change in bowel habits, loss of appetite, bloody stools.   Resp:    No excess mucus, no productive cough,  No non-productive cough,  No coughing up of blood.  No change in color of mucus.  No wheezing.  No chest wall deformity  Skin: no rash or lesions.  GU:  no dysuria, change in color of urine, no urgency or frequency.  No flank pain, no hematuria   MS:  No joint pain or swelling.  No decreased range of motion.  No back pain.  Psych:  No change in mood or affect. No depression or anxiety.  No memory loss.         Objective:   Physical Exam  GEN: A/Ox3; pleasant , NAD, elderly   HEENT:  [place]/AT,  EACs-clear, TMs-wnl, NOSE-clear, THROAT-clear, no lesions, no postnasal drip or exudate noted.   NECK:  Supple w/ fair ROM; no JVD; normal carotid impulses w/o bruits; no thyromegaly or nodules palpated; no lymphadenopathy.  RESP  Decreased BS in bases  no accessory muscle use, no dullness to percussion  CARD:  RRR, no m/r/g  ,tr  peripheral edema, pulses intact, no cyanosis or clubbing.  GI:   Soft & nt; nml bowel sounds; no organomegaly or masses detected.  Musco: Warm bil, no deformities or joint swelling noted.   Neuro: alert, no focal deficits noted.    Skin: Warm, no lesions or rashes        Assessment & Plan:

## 2015-01-19 NOTE — Assessment & Plan Note (Signed)
Recent flare now resolved  Cont on current regimen  Check cxr today .

## 2015-01-19 NOTE — Assessment & Plan Note (Signed)
Compensated on Oxygen .  Order to new DME for O2

## 2015-01-19 NOTE — Telephone Encounter (Signed)
Received a staff message from [facility] asking to add this frequency to the order.   As orders cannot be addended, so a new order has been placed.  Nothing further needed.

## 2015-01-19 NOTE — Assessment & Plan Note (Signed)
Recent echo shows worsening PAP ? Secondary to severe COPD .  Cont On O2 for now .  Cont to monitor

## 2015-01-21 NOTE — Progress Notes (Signed)
Quick Note:  Called and spoke with the patient. Reviewed results and recs. Pt voiced understanding and had no further questions. ______ 

## 2015-01-27 ENCOUNTER — Encounter: Payer: Self-pay | Admitting: Pulmonary Disease

## 2015-01-27 ENCOUNTER — Encounter: Payer: Self-pay | Admitting: Cardiology

## 2015-01-27 ENCOUNTER — Encounter: Payer: Self-pay | Admitting: Cardiovascular Disease

## 2015-01-28 NOTE — Telephone Encounter (Signed)
I will forward to SN as an [place]

## 2015-02-08 ENCOUNTER — Ambulatory Visit: Payer: Self-pay | Admitting: Internal Medicine

## 2015-02-13 DEATH — deceased

## 2015-04-18 ENCOUNTER — Ambulatory Visit: Payer: 59 | Admitting: Pulmonary Disease

## 2016-11-02 IMAGING — CT CT CHEST W/O CM
2 of 4 series · 15 of 36 positions shown, 18 images · non-contrast
Comparison: Chest radiographs dated 12/04/2014

CLINICAL DATA: Possible left lung granuloma on chest radiographs

EXAM:
CT CHEST WITHOUT CONTRAST
TECHNIQUE: Multidetector CT imaging of the chest was performed following the
standard protocol without IV contrast.

[Series 4: chest w/o 3mm st cor · coronal · non-contrast · 0.61mm/px · 3 of 89 slices shown]
[im 18/89  lung]
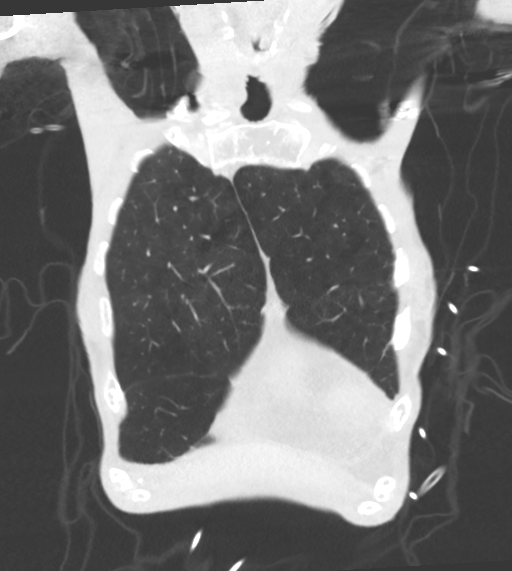
[im 36/89  lung]
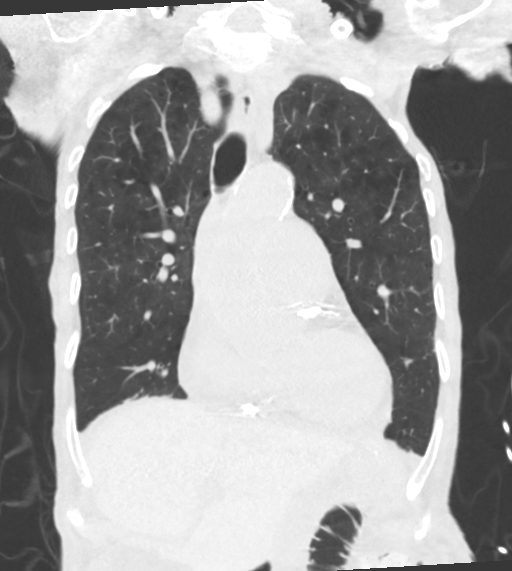
[im 53/89  lung]
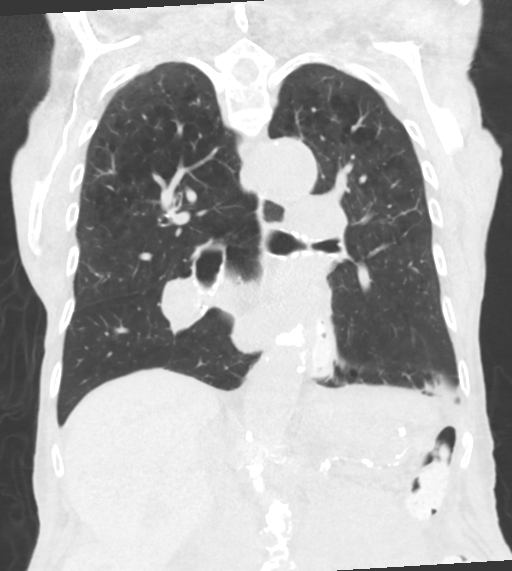

[Series 6: chest w/o 1mm st · axial · non-contrast · 0.65mm/px · z∈[+1368,+1683]mm · 12 of 432 slices shown, 15 images]
[im 19/432  mediastinal]
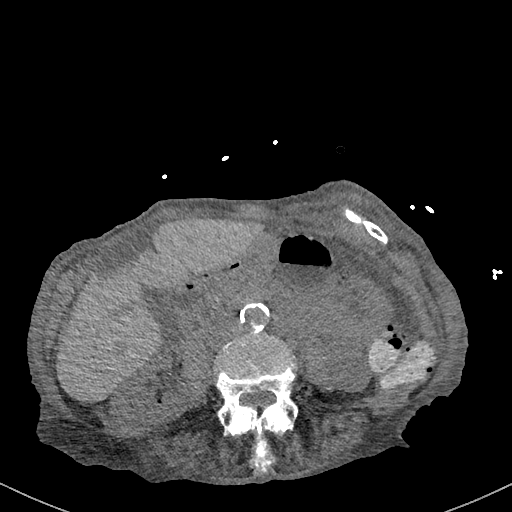
[im 19/432  lung]
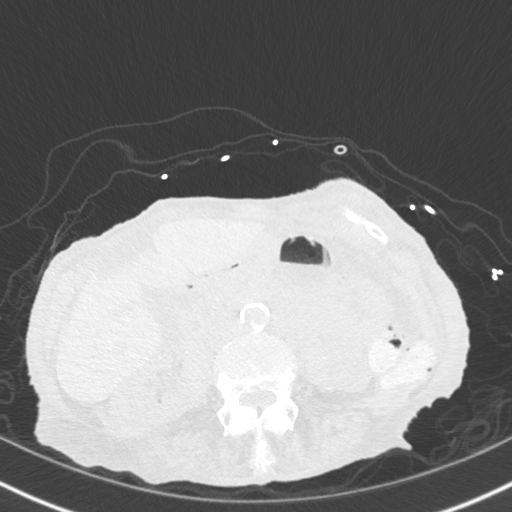
[im 57/432  lung]
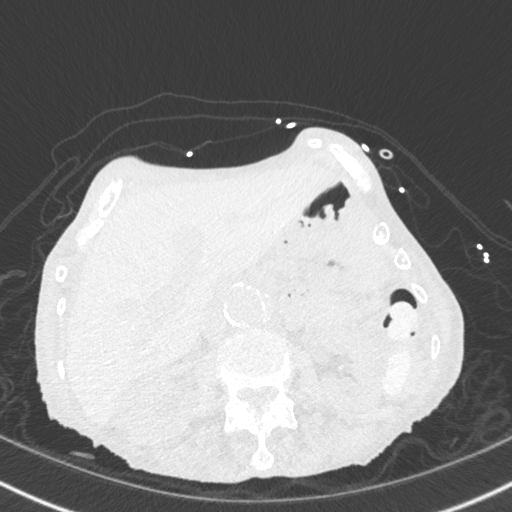
[im 94/432  lung]
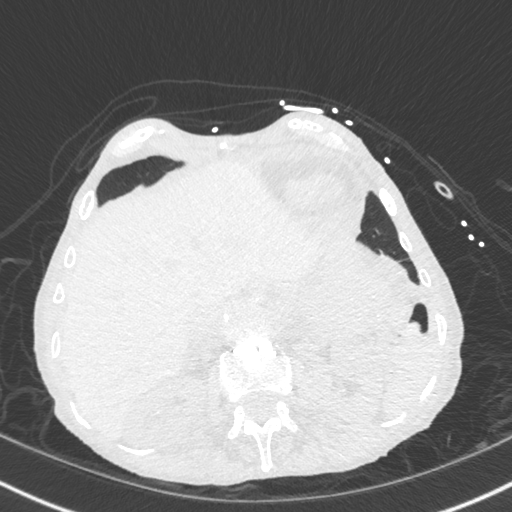
[im 132/432  lung]
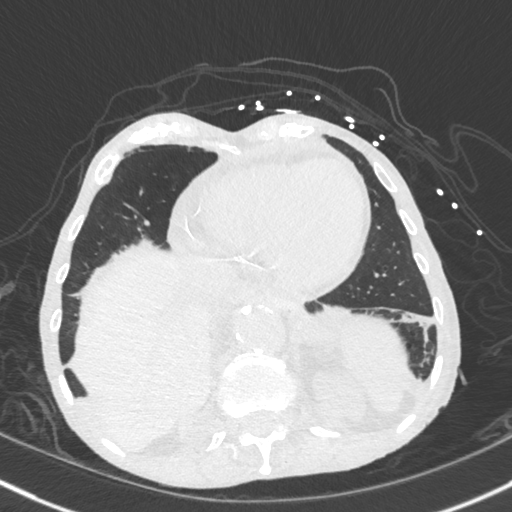
[im 169/432  mediastinal]
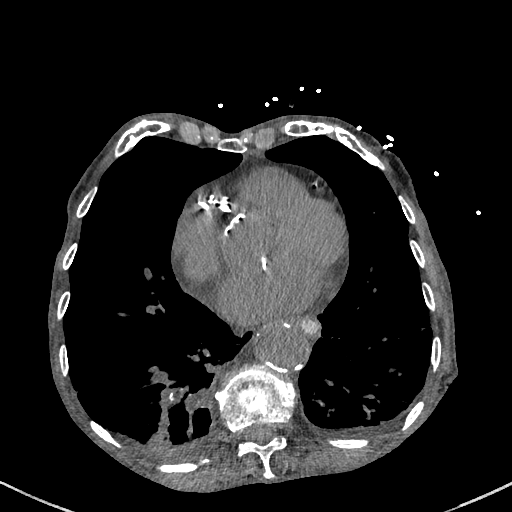
[im 169/432  lung]
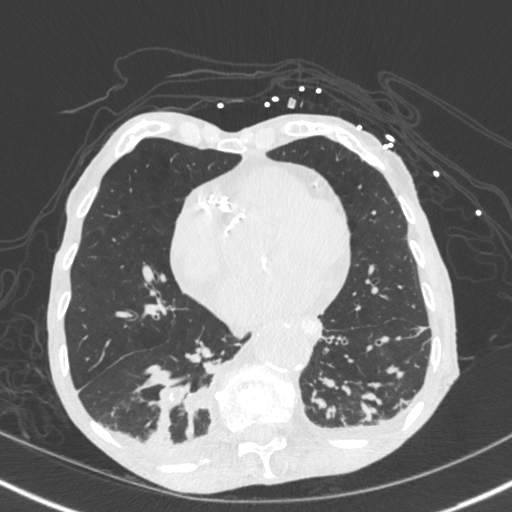
[im 207/432  lung]
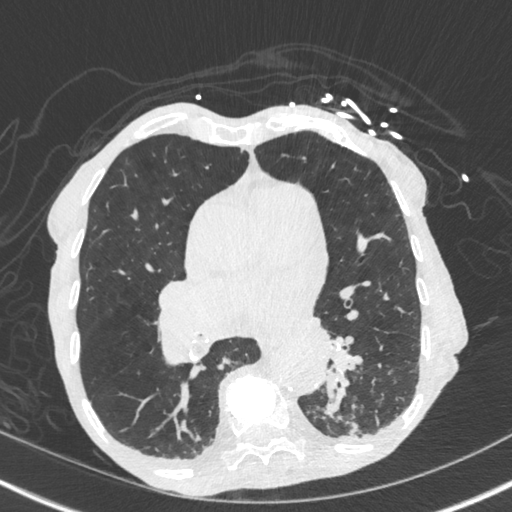
[im 225/432  lung]
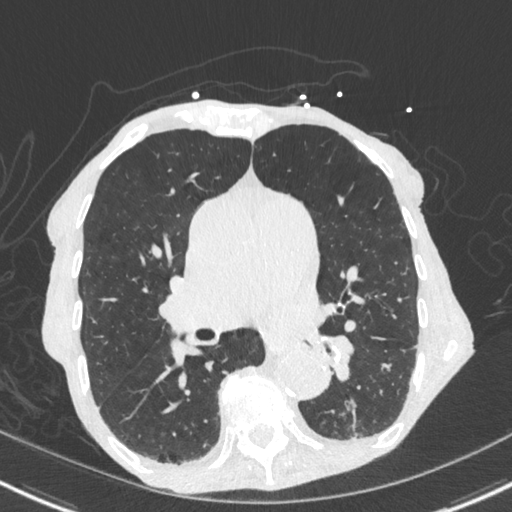
[im 263/432  lung]
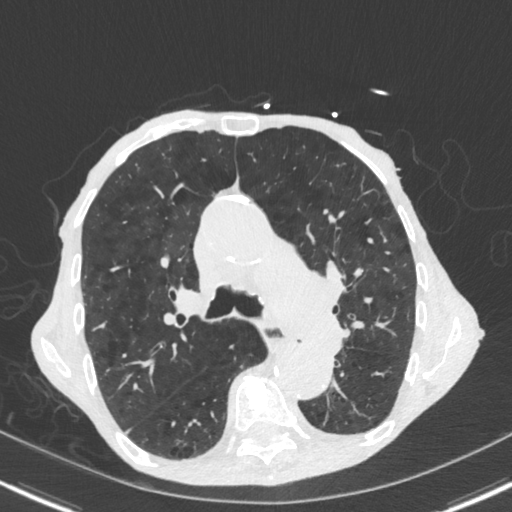
[im 300/432  mediastinal]
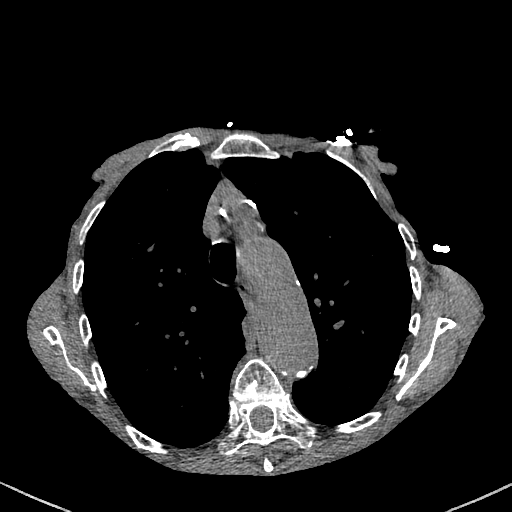
[im 300/432  lung]
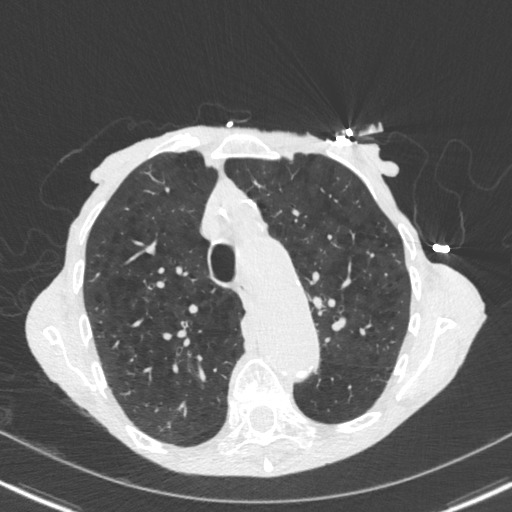
[im 338/432  lung]
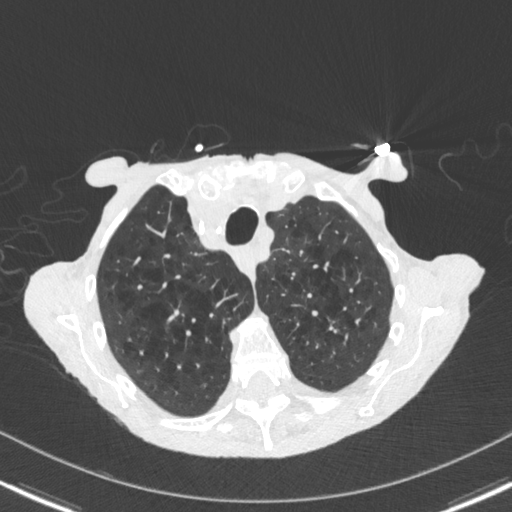
[im 375/432  lung]
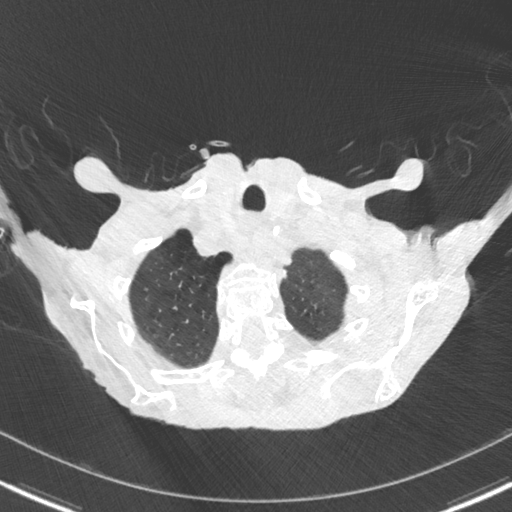
[im 413/432  lung]
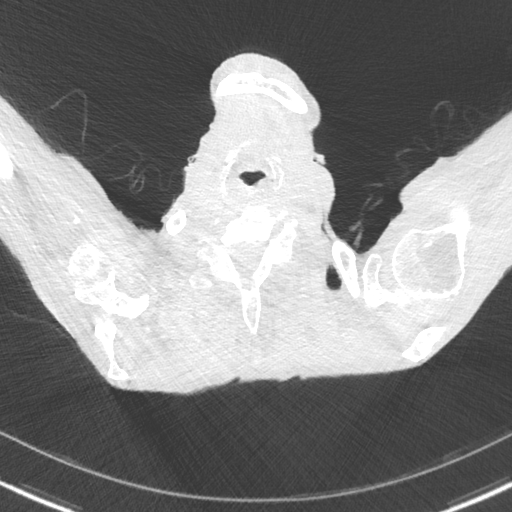

[15 of 36 positions shown; findings below may reference images not displayed]

FINDINGS: Mediastinum/Nodes: The heart is normal in size. Small anterior
pericardial fluid.

Coronary atherosclerosis.

Atherosclerotic calcifications of the aortic arch.

No suspicious mediastinal or axillary lymphadenopathy.

Visualized thyroid is unremarkable.

Lungs/Pleura: Mild patchy bilateral lower lobe opacities, right
greater than left, atelectasis versus pneumonia. Trace bilateral
pleural effusions.

Additional minimal faint ground-glass tree-in-bud nodularity in the
left lower lobe (series 3/ image 32). Associated bronchial wall
thickening in in the bilateral lower lobes. This appearance suggests
chronic bronchitis or possibly aspiration.

Underlying moderate centrilobular and paraseptal emphysematous
changes.

Mild mosaic attenuation.

No suspicious pulmonary nodules.

No pneumothorax.

Upper abdomen: Visualized upper abdomen is notable for vascular
calcifications, a 4 mm right renal parenchymal calcification, and
ectasia of the infrarenal abdominal aorta measuring up to 2.7 cm
(incompletely visualized.

Musculoskeletal: Multilevel vertebral augmentation of the visualized
thoracolumbar spine.

Mild multilevel degenerative changes.

Old sternal fracture deformity.
IMPRESSION: Mild patchy bilateral lower lobe opacities, right greater than left,
atelectasis versus pneumonia. Trace bilateral pleural effusions.

Bilateral lower lobe bronchial wall thickening with faint
tree-in-bud nodularity in the left lower lobe, suggesting chronic
bronchitis or possibly aspiration.

Underlying moderate centrilobular and paraseptal emphysematous
changes.

No suspicious pulmonary nodules.
